# Patient Record
Sex: Male | Born: 1990 | Race: Black or African American | Hispanic: No | Marital: Single | Smoking: Current some day smoker
Health system: Southern US, Community
[De-identification: ages and names within clinical notes are randomized; demographics above are authoritative.]

## PROBLEM LIST (undated history)

## (undated) ENCOUNTER — Ambulatory Visit: Admission: EM

## (undated) ENCOUNTER — Emergency Department: Discharge status: Absent without leave | Payer: BC Managed Care – PPO

## (undated) DIAGNOSIS — N200 Calculus of kidney: Secondary | ICD-10-CM

## (undated) DIAGNOSIS — J45909 Unspecified asthma, uncomplicated: Secondary | ICD-10-CM

## (undated) DIAGNOSIS — E7800 Pure hypercholesterolemia, unspecified: Secondary | ICD-10-CM

## (undated) DIAGNOSIS — M1990 Unspecified osteoarthritis, unspecified site: Secondary | ICD-10-CM

## (undated) DIAGNOSIS — F419 Anxiety disorder, unspecified: Secondary | ICD-10-CM

## (undated) DIAGNOSIS — L0291 Cutaneous abscess, unspecified: Secondary | ICD-10-CM

## (undated) HISTORY — DX: Anxiety disorder, unspecified: F41.9

## (undated) HISTORY — DX: Pure hypercholesterolemia, unspecified: E78.00

## (undated) HISTORY — DX: Calculus of kidney: N20.0

## (undated) HISTORY — PX: KNEE ARTHROSCOPY: SUR90

---

## 2014-10-24 ENCOUNTER — Emergency Department (INDEPENDENT_AMBULATORY_CARE_PROVIDER_SITE_OTHER)
Admission: EM | Admit: 2014-10-24 | Discharge: 2014-10-24 | Disposition: A | Discharge status: Home / Self Care | Payer: Managed Care, Other (non HMO) | Source: Home / Self Care | Attending: Emergency Medicine | Admitting: Emergency Medicine

## 2014-10-24 ENCOUNTER — Encounter (HOSPITAL_COMMUNITY): Payer: Self-pay | Admitting: Emergency Medicine

## 2014-10-24 ENCOUNTER — Ambulatory Visit (HOSPITAL_COMMUNITY): Payer: Managed Care, Other (non HMO) | Attending: Emergency Medicine

## 2014-10-24 DIAGNOSIS — R0789 Other chest pain: Secondary | ICD-10-CM

## 2014-10-24 DIAGNOSIS — R0602 Shortness of breath: Secondary | ICD-10-CM | POA: Insufficient documentation

## 2014-10-24 DIAGNOSIS — R05 Cough: Secondary | ICD-10-CM | POA: Insufficient documentation

## 2014-10-24 DIAGNOSIS — J209 Acute bronchitis, unspecified: Secondary | ICD-10-CM

## 2014-10-24 DIAGNOSIS — R079 Chest pain, unspecified: Secondary | ICD-10-CM | POA: Diagnosis not present

## 2014-10-24 DIAGNOSIS — R059 Cough, unspecified: Secondary | ICD-10-CM

## 2014-10-24 DIAGNOSIS — J4520 Mild intermittent asthma, uncomplicated: Secondary | ICD-10-CM

## 2014-10-24 HISTORY — DX: Unspecified asthma, uncomplicated: J45.909

## 2014-10-24 MED ORDER — HYDROCODONE-ACETAMINOPHEN 5-325 MG PO TABS
ORAL_TABLET | ORAL | Status: AC
  Filled 2014-10-24: qty 2

## 2014-10-24 MED ORDER — BECLOMETHASONE DIPROPIONATE 80 MCG/ACT IN AERS: 2.0000 | INHALATION_SPRAY | Freq: Two times a day (BID) | RESPIRATORY_TRACT | Status: DC

## 2014-10-24 MED ORDER — HYDROCOD POLST-CHLORPHEN POLST 10-8 MG/5ML PO LQCR: 5.0000 mL | Freq: Two times a day (BID) | ORAL | Status: DC | PRN

## 2014-10-24 MED ORDER — PREDNISONE 20 MG PO TABS: 20.0000 mg | ORAL_TABLET | Freq: Two times a day (BID) | ORAL | Status: DC

## 2014-10-24 MED ORDER — ALBUTEROL SULFATE HFA 108 (90 BASE) MCG/ACT IN AERS: 2.0000 | INHALATION_SPRAY | RESPIRATORY_TRACT | Status: DC | PRN

## 2014-10-24 MED ORDER — OXYCODONE-ACETAMINOPHEN 5-325 MG PO TABS: ORAL_TABLET | ORAL | Status: DC

## 2014-10-24 MED ORDER — HYDROCODONE-ACETAMINOPHEN 5-325 MG PO TABS
2.0000 | ORAL_TABLET | Freq: Once | ORAL | Status: AC
  Administered 2014-10-24: 2 via ORAL

## 2014-10-24 MED ORDER — AZITHROMYCIN 250 MG PO TABS: ORAL_TABLET | ORAL | Status: DC

## 2014-10-24 NOTE — Discharge Instructions (Signed)

## 2014-10-24 NOTE — ED Provider Notes (Signed)
Chief Complaint   Cough and Pleurisy   History of Present Illness   Victor Burns is a 23 year old male who's had a one-week history of cough productive yellow sputum, chest pain which is substernal, pleuritic, worse with movement, shortness of breath, he's felt lightheaded, he has a history of asthma all his life and takes albuterol and as-needed basis. He is smoking a pack of cigarettes a day. He's also has some rhinorrhea and sore throat. He denies any fever.  Review of Systems   Other than as noted above, the patient denies any of the following symptoms: Systemic:  No fevers, chills, sweats, or myalgias. Eye:  No redness or discharge. ENT:  No ear pain, headache, nasal congestion, drainage, sinus pressure, or sore throat. Neck:  No neck pain, stiffness, or swollen glands. Lungs:  No cough, sputum production, hemoptysis, wheezing, chest tightness, shortness of breath or chest pain. GI:  No abdominal pain, nausea, vomiting or diarrhea.  PMFSH   Past medical history, family history, social history, meds, and allergies were reviewed.   Physical exam   Vital signs:  BP 120/85 mmHg  Pulse 55  Temp(Src) 98.2 F (36.8 C) (Oral)  Resp 12  SpO2 98% General:  Alert and oriented.  In no distress.  Skin warm and dry. Eye:  No conjunctival injection or drainage. Lids were normal. ENT:  TMs and canals were normal, without erythema or inflammation.  Nasal mucosa was clear and uncongested, without drainage.  Mucous membranes were moist.  Pharynx was clear with no exudate or drainage.  There were no oral ulcerations or lesions. Neck:  Supple, no adenopathy, tenderness or mass. Lungs:  No respiratory distress.  Lungs were clear to auscultation, without wheezes, rales or rhonchi.  Breath sounds were clear and equal bilaterally.  Heart:  Regular rhythm, without gallops, murmers or rubs. Skin:  Clear, warm, and dry, without rash or lesions.  Radiology   Dg Chest 2 View  10/24/2014    CLINICAL DATA:  Productive cough for 2 weeks. Chest pain short of breath  EXAM: CHEST  2 VIEW  COMPARISON:  None.  FINDINGS: Normal mediastinum and cardiac silhouette. Normal pulmonary vasculature. No evidence of effusion, infiltrate, or pneumothorax. No acute bony abnormality.  IMPRESSION: Normal chest radiograph.   Electronically Signed   By: Genevive BiStewart  Edmunds M.D.   On: 10/24/2014 13:41   Course in [facility]   The following medications were given:  Medications  HYDROcodone-acetaminophen (NORCO/VICODIN) 5-325 MG per tablet 2 tablet (2 tablets Oral Given 10/24/14 1207)   Assessment     The primary encounter diagnosis was Acute bronchitis, unspecified organism. Diagnoses of Cough, Musculoskeletal chest pain, and Asthma, mild intermittent, uncomplicated were also pertinent to this visit.  There is no evidence of pneumonia, strep throat, sinusitis, otitis media.    Plan    1.  Meds:  The following meds were prescribed:   Discharge Medication List as of 10/24/2014  2:00 PM    START taking these medications   Details  albuterol (PROVENTIL HFA;VENTOLIN HFA) 108 (90 BASE) MCG/ACT inhaler Inhale 2 puffs into the lungs every 4 (four) hours as needed for wheezing or shortness of breath., Starting 10/24/2014, Until Discontinued, Normal    azithromycin (ZITHROMAX Z-PAK) 250 MG tablet Take as directed., Normal    beclomethasone (QVAR) 80 MCG/ACT inhaler Inhale 2 puffs into the lungs 2 (two) times daily., Starting 10/24/2014, Until Discontinued, Normal    chlorpheniramine-HYDROcodone (TUSSIONEX) 10-8 MG/5ML LQCR Take 5 mLs by mouth every 12 (  twelve) hours as needed for cough., Starting 10/24/2014, Until Discontinued, Normal    oxyCODONE-acetaminophen (PERCOCET) 5-325 MG per tablet 1 to 2 tablets every 6 hours as needed for pain., Print    predniSONE (DELTASONE) 20 MG tablet Take 1 tablet (20 mg total) by mouth 2 (two) times daily., Starting 10/24/2014, Until Discontinued, Normal         2.  Patient Education/Counseling:  The patient was given appropriate handouts, self care instructions, and instructed in symptomatic relief.  Instructed to get extra fluids and extra rest.    3.  Follow up:  The patient was told to follow up here if no better in 3 to 4 days, or sooner if becoming worse in any way, and given some red flag symptoms such as increasing fever, difficulty breathing, chest pain, or persistent vomiting which would prompt immediate return.       Reuben Likesavid C Elias Dennington, MD 10/24/14 2117

## 2014-10-24 NOTE — ED Notes (Signed)
Pt states that he has had a cough since Sunday. Pt states that he started having chest pain after he had been coughing for 45 minutes. Pt is in no acute distress at this time.

## 2015-01-03 ENCOUNTER — Emergency Department (HOSPITAL_COMMUNITY)
Admission: EM | Admit: 2015-01-03 | Discharge: 2015-01-04 | Disposition: A | Discharge status: Home / Self Care | Payer: Managed Care, Other (non HMO) | Attending: Emergency Medicine | Admitting: Emergency Medicine

## 2015-01-03 ENCOUNTER — Encounter (HOSPITAL_COMMUNITY): Payer: Self-pay

## 2015-01-03 ENCOUNTER — Emergency Department (HOSPITAL_COMMUNITY): Payer: Managed Care, Other (non HMO)

## 2015-01-03 DIAGNOSIS — Z7951 Long term (current) use of inhaled steroids: Secondary | ICD-10-CM | POA: Diagnosis not present

## 2015-01-03 DIAGNOSIS — Z720 Tobacco use: Secondary | ICD-10-CM | POA: Insufficient documentation

## 2015-01-03 DIAGNOSIS — Z79899 Other long term (current) drug therapy: Secondary | ICD-10-CM | POA: Insufficient documentation

## 2015-01-03 DIAGNOSIS — J45909 Unspecified asthma, uncomplicated: Secondary | ICD-10-CM | POA: Diagnosis not present

## 2015-01-03 DIAGNOSIS — Z7952 Long term (current) use of systemic steroids: Secondary | ICD-10-CM | POA: Insufficient documentation

## 2015-01-03 DIAGNOSIS — K611 Rectal abscess: Secondary | ICD-10-CM | POA: Diagnosis not present

## 2015-01-03 LAB — COMPREHENSIVE METABOLIC PANEL
ALBUMIN: 3.7 g/dL (ref 3.5–5.2)
ALT: 10 U/L (ref 0–53)
AST: 22 U/L (ref 0–37)
Alkaline Phosphatase: 62 U/L (ref 39–117)
Anion gap: 4 — ABNORMAL LOW (ref 5–15)
BUN: 11 mg/dL (ref 6–23)
CO2: 28 mmol/L (ref 19–32)
Calcium: 8.8 mg/dL (ref 8.4–10.5)
Chloride: 103 mmol/L (ref 96–112)
Creatinine, Ser: 1.14 mg/dL (ref 0.50–1.35)
GFR calc Af Amer: >90 mL/min (ref >90)
GLUCOSE: 93 mg/dL (ref 70–99)
Potassium: 3.5 mmol/L (ref 3.5–5.1)
SODIUM: 135 mmol/L (ref 135–145)
TOTAL PROTEIN: 6.6 g/dL (ref 6.0–8.3)
Total Bilirubin: 0.5 mg/dL (ref 0.3–1.2)

## 2015-01-03 LAB — CBC WITH DIFFERENTIAL/PLATELET
BASOS PCT: 0 % (ref 0–1)
Basophils Absolute: 0 10*3/uL (ref 0.0–0.1)
EOS PCT: 0 % (ref 0–5)
Eosinophils Absolute: 0 10*3/uL (ref 0.0–0.7)
HEMATOCRIT: 36.2 % — AB (ref 39.0–52.0)
HEMOGLOBIN: 12.3 g/dL — AB (ref 13.0–17.0)
Lymphocytes Relative: 14 % (ref 12–46)
Lymphs Abs: 1.8 10*3/uL (ref 0.7–4.0)
MCH: 30.1 pg (ref 26.0–34.0)
MCHC: 34 g/dL (ref 30.0–36.0)
MCV: 88.5 fL (ref 78.0–100.0)
MONOS PCT: 9 % (ref 3–12)
Monocytes Absolute: 1.1 10*3/uL — ABNORMAL HIGH (ref 0.1–1.0)
Neutro Abs: 9.7 10*3/uL — ABNORMAL HIGH (ref 1.7–7.7)
Neutrophils Relative %: 77 % (ref 43–77)
Platelets: 220 10*3/uL (ref 150–400)
RBC: 4.09 MIL/uL — AB (ref 4.22–5.81)
RDW: 12.9 % (ref 11.5–15.5)
WBC: 12.6 10*3/uL — ABNORMAL HIGH (ref 4.0–10.5)

## 2015-01-03 MED ORDER — CEPHALEXIN 500 MG PO CAPS: 500.0000 mg | ORAL_CAPSULE | Freq: Four times a day (QID) | ORAL | Status: DC

## 2015-01-03 MED ORDER — LIDOCAINE-EPINEPHRINE 2 %-1:100000 IJ SOLN
20.0000 mL | Freq: Once | INTRAMUSCULAR | Status: DC
  Filled 2015-01-03: qty 20

## 2015-01-03 MED ORDER — OXYCODONE-ACETAMINOPHEN 5-325 MG PO TABS
1.0000 | ORAL_TABLET | Freq: Once | ORAL | Status: AC
  Administered 2015-01-03: 1 via ORAL
  Filled 2015-01-03: qty 1

## 2015-01-03 MED ORDER — LIDOCAINE-EPINEPHRINE (PF) 2 %-1:200000 IJ SOLN
20.0000 mL | Freq: Once | INTRAMUSCULAR | Status: AC
  Administered 2015-01-03: 20 mL

## 2015-01-03 MED ORDER — HYDROMORPHONE HCL 1 MG/ML IJ SOLN
1.0000 mg | Freq: Once | INTRAMUSCULAR | Status: AC
  Administered 2015-01-03: 1 mg via INTRAVENOUS
  Filled 2015-01-03: qty 1

## 2015-01-03 MED ORDER — IOHEXOL 300 MG/ML  SOLN
100.0000 mL | Freq: Once | INTRAMUSCULAR | Status: AC | PRN
  Administered 2015-01-03: 100 mL via INTRAVENOUS

## 2015-01-03 MED ORDER — HYDROCODONE-ACETAMINOPHEN 5-325 MG PO TABS: 1.0000 | ORAL_TABLET | Freq: Four times a day (QID) | ORAL | Status: DC | PRN

## 2015-01-03 MED ORDER — SODIUM CHLORIDE 0.9 % IV BOLUS (SEPSIS)
1000.0000 mL | Freq: Once | INTRAVENOUS | Status: AC
Start: 2015-01-03 — End: 2015-01-03
  Administered 2015-01-03: 1000 mL via INTRAVENOUS

## 2015-01-03 MED ORDER — LIDOCAINE HCL 2 % EX GEL
1.0000 "application " | Freq: Once | CUTANEOUS | Status: AC
  Administered 2015-01-03: 1 via TOPICAL
  Filled 2015-01-03: qty 20

## 2015-01-03 MED ORDER — SULFAMETHOXAZOLE-TRIMETHOPRIM 800-160 MG PO TABS: 1.0000 | ORAL_TABLET | Freq: Two times a day (BID) | ORAL | Status: AC

## 2015-01-03 NOTE — ED Notes (Signed)
PA and Physician at bedside.

## 2015-01-03 NOTE — ED Notes (Signed)
Pt placed in a gown with BP cuff and pulse ox

## 2015-01-03 NOTE — ED Provider Notes (Signed)
CSN: [id number]     Arrival date & time 01/03/15  1650 History   First MD Initiated Contact with Patient 01/03/15 1920     Chief Complaint  Patient presents with  . Abscess     (Consider location/radiation/quality/duration/timing/severity/associated sxs/prior Treatment) Patient is a 24 y.o. male presenting with abscess. The history is provided by the patient. No language interpreter was used.  Abscess Location:  Ano-genital Ano-genital abscess location:  Anus Abscess quality: fluctuance and painful   Duration:  3 days Progression:  Worsening Pain details:    Quality:  Pressure and throbbing   Severity:  Severe Associated symptoms: nausea   Associated symptoms: no fever     Past Medical History  Diagnosis Date  . Asthma    History reviewed. No pertinent past surgical history. No family history on file. History  Substance Use Topics  . Smoking status: Current Every Day Smoker -- 1.00 packs/day    Types: Cigarettes  . Smokeless tobacco: Not on file  . Alcohol Use: Yes     Comment: at least a beer a day    Review of Systems  Constitutional: Negative for fever.  Gastrointestinal: Positive for nausea.  All other systems reviewed and are negative.     Allergies  Review of patient's allergies indicates no known allergies.  Home Medications   Prior to Admission medications   Medication Sig Start Date End Date Taking? Authorizing Provider  albuterol (PROVENTIL HFA;VENTOLIN HFA) 108 (90 BASE) MCG/ACT inhaler Inhale 2 puffs into the lungs every 4 (four) hours as needed for wheezing or shortness of breath. 10/24/14   Reuben Likes, MD  azithromycin (ZITHROMAX Z-PAK) 250 MG tablet Take as directed. 10/24/14   Reuben Likes, MD  beclomethasone (QVAR) 80 MCG/ACT inhaler Inhale 2 puffs into the lungs 2 (two) times daily. 10/24/14   Reuben Likes, MD  chlorpheniramine-HYDROcodone (TUSSIONEX) 10-8 MG/5ML LQCR Take 5 mLs by mouth every 12 (twelve) hours as needed for cough.  10/24/14   Reuben Likes, MD  oxyCODONE-acetaminophen (PERCOCET) 5-325 MG per tablet 1 to 2 tablets every 6 hours as needed for pain. 10/24/14   Reuben Likes, MD  predniSONE (DELTASONE) 20 MG tablet Take 1 tablet (20 mg total) by mouth 2 (two) times daily. 10/24/14   Reuben Likes, MD   BP 127/71 mmHg  Pulse 77  Temp(Src) 99 F (37.2 C) (Oral)  Resp 20  SpO2 100% Physical Exam  Constitutional: He is oriented to person, place, and time. He appears well-developed and well-nourished.  HENT:  Head: Normocephalic.  Eyes: Pupils are equal, round, and reactive to light.  Neck: Normal range of motion.  Cardiovascular: Normal rate and regular rhythm.   Pulmonary/Chest: Effort normal and breath sounds normal.  Abdominal: Soft. Bowel sounds are normal.  Genitourinary: Rectal exam shows tenderness.     Musculoskeletal: He exhibits no edema or tenderness.  Lymphadenopathy:    He has no cervical adenopathy.  Neurological: He is alert and oriented to person, place, and time.  Skin: Skin is warm and dry.  Psychiatric: He has a normal mood and affect.  Nursing note and vitals reviewed.   ED Course  Procedures (including critical care time)  INCISION AND DRAINAGE Performed by: Jimmye Norman Consent: Verbal consent obtained. Risks and benefits: risks, benefits and alternatives were discussed Type: abscess  Body area: perirectal  Anesthesia: local infiltration  Incision was made with a scalpel.  Local anesthetic: lidocaine 2% with epinephrine  Anesthetic total: 6 ml  Complexity: complex Blunt dissection to break up loculations  Drainage: purulent  Drainage amount: copious Packing material: 1/4 in iodoform gauze  Patient tolerance: Patient tolerated the procedure well with no immediate complications.   Labs Review Labs Reviewed - No data to display  Imaging Review No results found.   EKG Interpretation None     Patient discussed with and seen by Dr. Silverio LayYao.  CT  pelvis reveals large perirectal abscess abutting the anus. MDM   Final diagnoses:  None    Perirectal abscess. I&D performed, packing placed.  Antibiotic and analgesia prescriptions provided.  Patient to return in 2 days for packing removal and wound recheck.    Jimmye Normanavid John Genisis Sonnier, NP 01/04/15 0031  Richardean Canalavid H Yao, MD 01/06/15 [phone]

## 2015-01-03 NOTE — Discharge Instructions (Signed)
Peri-Rectal Abscess Your caregiver has diagnosed you as having a peri-rectal abscess. This is an infected area near the rectum that is filled with pus. If the abscess is near the surface of the skin, your caregiver may open (incise) the area and drain the pus. HOME CARE INSTRUCTIONS   If your abscess was opened up and drained. A small piece of gauze may be placed in the opening so that it can drain. Do not remove the gauze unless directed by your caregiver.  A loose dressing may be placed over the abscess site. Change the dressing as often as necessary to keep it clean and dry.  After the drain is removed, the area may be washed with a gentle antiseptic (soap) four times per day.  A warm sitz bath, warm packs or heating pad may be used for pain relief, taking care not to burn yourself.  Return for a wound check in 2 days.  An "inflatable doughnut" may be used for sitting with added comfort. These can be purchased at a drugstore or medical supply house.  To reduce pain and straining with bowel movements, eat a high fiber diet with plenty of fruits and vegetables. Use stool softeners as recommended by your caregiver. This is especially important if narcotic type pain medications were prescribed as these may cause marked constipation.  Only take over-the-counter or prescription medicines for pain, discomfort, or fever as directed by your caregiver. SEEK IMMEDIATE MEDICAL CARE IF:   You have increasing pain that is not controlled by medication.  There is increased inflammation (redness), swelling, bleeding, or drainage from the area.  An oral temperature above 102 F (38.9 C) develops.  You develop chills or generalized malaise (feel lethargic or feel "washed out").  You develop any new symptoms (problems) you feel may be related to your present problem. Document Released: 11/19/2000 Document Revised: 02/14/2012 Document Reviewed: 11/19/2008 [facility] Patient Information 2015 [place],  [place]. This information is not intended to replace advice given to you by your health care provider. Make sure you discuss any questions you have with your health care provider.

## 2015-01-03 NOTE — ED Notes (Signed)
Pt reports having an abscess on his butt between his legs. Pt reports having to leave work d/t pain.

## 2015-01-03 NOTE — ED Notes (Signed)
MD at bedside. 

## 2015-01-03 NOTE — ED Notes (Signed)
Pt reports swollen area that he states started as a hemrrhoid for "a couple of days." pt reports that he is having trouble walking due to the pain. Pt alert and oriented, denies feeling feverish or having chills.

## 2015-01-05 ENCOUNTER — Encounter (HOSPITAL_COMMUNITY): Payer: Self-pay | Admitting: Emergency Medicine

## 2015-01-05 ENCOUNTER — Emergency Department (HOSPITAL_COMMUNITY)
Admission: EM | Admit: 2015-01-05 | Discharge: 2015-01-05 | Disposition: A | Discharge status: Home / Self Care | Payer: Managed Care, Other (non HMO) | Attending: Emergency Medicine | Admitting: Emergency Medicine

## 2015-01-05 DIAGNOSIS — Z5189 Encounter for other specified aftercare: Secondary | ICD-10-CM

## 2015-01-05 DIAGNOSIS — Z7952 Long term (current) use of systemic steroids: Secondary | ICD-10-CM | POA: Diagnosis not present

## 2015-01-05 DIAGNOSIS — Z79899 Other long term (current) drug therapy: Secondary | ICD-10-CM | POA: Insufficient documentation

## 2015-01-05 DIAGNOSIS — Z4801 Encounter for change or removal of surgical wound dressing: Secondary | ICD-10-CM | POA: Diagnosis present

## 2015-01-05 DIAGNOSIS — Z7951 Long term (current) use of inhaled steroids: Secondary | ICD-10-CM | POA: Diagnosis not present

## 2015-01-05 DIAGNOSIS — K610 Anal abscess: Secondary | ICD-10-CM | POA: Diagnosis not present

## 2015-01-05 DIAGNOSIS — Z720 Tobacco use: Secondary | ICD-10-CM | POA: Diagnosis not present

## 2015-01-05 DIAGNOSIS — J45909 Unspecified asthma, uncomplicated: Secondary | ICD-10-CM | POA: Insufficient documentation

## 2015-01-05 MED ORDER — HYDROMORPHONE HCL 1 MG/ML IJ SOLN
1.0000 mg | Freq: Once | INTRAMUSCULAR | Status: AC
  Administered 2015-01-05: 1 mg via INTRAMUSCULAR
  Filled 2015-01-05: qty 1

## 2015-01-05 NOTE — ED Provider Notes (Signed)
CSN: [id number]     Arrival date & time 01/05/15  1813 History  This chart was scribed for non-physician practitioner, Jaynie Crumbleatyana Kent Riendeau, PA-C working with Nelia Shiobert L Beaton, MD by Greggory StallionKayla Andersen, ED scribe. This patient was seen in room TR06C/TR06C and the patient's care was started at 6:38 PM.    Chief Complaint  Patient presents with  . Wound Check   The history is provided by the patient. No language interpreter was used.    HPI Comments: Victor Burns is a 24 y.o. male who presents to the Emergency Department for wound check. Pt had abscess to his buttocks I&D'd and packed 2 days ago and is here for packing removal. Reports continued pain and drainage from the area. He has taken Vicodin with no relief. Just started antibiotics today. No fever, chills, malaise.   Past Medical History  Diagnosis Date  . Asthma    History reviewed. No pertinent past surgical history. No family history on file. History  Substance Use Topics  . Smoking status: Current Every Day Smoker -- 1.00 packs/day    Types: Cigarettes  . Smokeless tobacco: Not on file  . Alcohol Use: Yes     Comment: at least a beer a day    Review of Systems  Constitutional: Negative for fever and chills.  Skin:       Abscess  All other systems reviewed and are negative.  Allergies  Review of patient's allergies indicates no known allergies.  Home Medications   Prior to Admission medications   Medication Sig Start Date End Date Taking? Authorizing Provider  albuterol (PROVENTIL HFA;VENTOLIN HFA) 108 (90 BASE) MCG/ACT inhaler Inhale 2 puffs into the lungs every 4 (four) hours as needed for wheezing or shortness of breath. 10/24/14   Reuben Likesavid C Keller, MD  azithromycin (ZITHROMAX Z-PAK) 250 MG tablet Take as directed. 10/24/14   Reuben Likesavid C Keller, MD  beclomethasone (QVAR) 80 MCG/ACT inhaler Inhale 2 puffs into the lungs 2 (two) times daily. 10/24/14   Reuben Likesavid C Keller, MD  cephALEXin (KEFLEX) 500 MG capsule Take 1 capsule (500  mg total) by mouth 4 (four) times daily. 01/03/15   Jimmye Normanavid John Smith, NP  chlorpheniramine-HYDROcodone (TUSSIONEX) 10-8 MG/5ML LQCR Take 5 mLs by mouth every 12 (twelve) hours as needed for cough. 10/24/14   Reuben Likesavid C Keller, MD  HYDROcodone-acetaminophen (NORCO/VICODIN) 5-325 MG per tablet Take 1 tablet by mouth every 6 (six) hours as needed for severe pain. 01/03/15   Jimmye Normanavid John Smith, NP  ibuprofen (ADVIL,MOTRIN) 800 MG tablet Take 800 mg by mouth every 8 (eight) hours as needed.    Historical Provider, MD  oxyCODONE-acetaminophen (PERCOCET) 5-325 MG per tablet 1 to 2 tablets every 6 hours as needed for pain. 10/24/14   Reuben Likesavid C Keller, MD  phenylephrine-shark liver oil-mineral oil-petrolatum (PREPARATION H) 0.25-3-14-71.9 % rectal ointment Place 1 application rectally 2 (two) times daily as needed for hemorrhoids.    Historical Provider, MD  predniSONE (DELTASONE) 20 MG tablet Take 1 tablet (20 mg total) by mouth 2 (two) times daily. 10/24/14   Reuben Likesavid C Keller, MD  sulfamethoxazole-trimethoprim (BACTRIM DS,SEPTRA DS) 800-160 MG per tablet Take 1 tablet by mouth 2 (two) times daily. 01/03/15 01/10/15  Jimmye Normanavid John Smith, NP   BP 134/74 mmHg  Pulse 72  Temp(Src) 97.6 F (36.4 C) (Oral)  Resp 16  SpO2 97%   Physical Exam  Constitutional: He is oriented to person, place, and time. He appears well-developed and well-nourished. No distress.  HENT:  Head:  Normocephalic and atraumatic.  Eyes: Conjunctivae and EOM are normal.  Neck: Neck supple. No tracheal deviation present.  Cardiovascular: Normal rate.   Pulmonary/Chest: Effort normal. No respiratory distress.  Musculoskeletal: Normal range of motion.  Neurological: He is alert and oriented to person, place, and time.  Skin: Skin is warm and dry.  Incised abscess to the left perianal area, with packing intact, no surrounding erythema, there is swelling, induration, tender to palpation. Approximately 5 x 5 cm.  Psychiatric: He has a normal mood and  affect. His behavior is normal.  Nursing note and vitals reviewed.   ED Course  Procedures (including critical care time)  DIAGNOSTIC STUDIES: Oxygen Saturation is 97% on RA, normal by my interpretation.    COORDINATION OF CARE: 6:41 PM-Pt requests pain medication before packing removal. Will give IM dilaudid then remove packing.    Labs Review Labs Reviewed - No data to display  Imaging Review Ct Pelvis W Contrast  01/03/2015   CLINICAL DATA:  Pt reports swollen area that he states started as a hemrrhoid for "a couple of days." pt reports that he is having trouble walking due to the pain.  EXAM: CT PELVIS WITH CONTRAST  TECHNIQUE: Multidetector CT imaging of the pelvis was performed using the standard protocol following the bolus administration of intravenous contrast.  CONTRAST:  OMNIPAQUE IOHEXOL 300 MG/ML  SOLN  COMPARISON:  None.  FINDINGS: Visualized small bowel and colon are normal without wall thickening or dilatation. There is no pelvic free fluid. There is no pelvic lymphadenopathy. The bladder is normal.  There is a left posterior perineal 4.3 x 2.9 x 3.7 cm fluid collection with an air-fluid level and surrounding inflammatory changes most consistent with an abscess with the superior margin abutting the anus.  No acute osseous abnormality. No fracture or dislocation. No lytic or sclerotic osseous lesion.  IMPRESSION: There is a left posterior perineal 4.3 x 2.9 x 3.7 cm fluid collection with an air-fluid level and surrounding inflammatory changes most consistent with an abscess.   Electronically Signed   By: Elige Ko   On: 01/03/2015 21:47     EKG Interpretation None      MDM   Final diagnoses:  Abscess, perianal  Encounter for wound re-check     patient is here for packing removal and wound recheck. Patient with perianal abscess by CT scan 2 days ago, abscess incised and drained in emergency department, packed. Patient is here for recheck. He continues to have  pain and drainage. He denies any fever or chills. He is taking Vicodin with minimal relief of his pain. Packing removed, abscess appears to be healing well. No purulent drainage expressed on evaluation. No apparent surrounding cellulitis. Patient just started antibiotics today, encouraged to make sure he takes all of them. Wound care at home, including warm soaks. Follow up with primary care doctor, return as needed.  Filed Vitals:   01/05/15 1818  BP: 134/74  Pulse: 72  Temp: 97.6 F (36.4 C)  TempSrc: Oral  Resp: 16  SpO2: 97%     I personally performed the services described in this documentation, which was scribed in my presence. The recorded information has been reviewed and is accurate.  Lottie Mussel, PA-C 01/05/15 1922  Lottie Mussel, PA-C 01/05/15 1922  Nelia Shi, MD 01/05/15 [phone]

## 2015-01-05 NOTE — ED Notes (Signed)
Seen in ED on Friday for abscess on buttocks and told to come back for packing removal.

## 2015-01-05 NOTE — Discharge Instructions (Signed)
Continue pain medications and antibiotics. Warm soaks with antibacterial soap several times a day. Follow up as needed   Abscess Care After An abscess (also called a boil or furuncle) is an infected area that contains a collection of pus. Signs and symptoms of an abscess include pain, tenderness, redness, or hardness, or you may feel a moveable soft area under your skin. An abscess can occur anywhere in the body. The infection may spread to surrounding tissues causing cellulitis. A cut (incision) by the surgeon was made over your abscess and the pus was drained out. Gauze may have been packed into the space to provide a drain that will allow the cavity to heal from the inside outwards. The boil may be painful for 5 to 7 days. Most people with a boil do not have high fevers. Your abscess, if seen early, may not have localized, and may not have been lanced. If not, another appointment may be required for this if it does not get better on its own or with medications. HOME CARE INSTRUCTIONS   Only take over-the-counter or prescription medicines for pain, discomfort, or fever as directed by your caregiver.  When you bathe, soak and then remove gauze or iodoform packs at least daily or as directed by your caregiver. You may then wash the wound gently with mild soapy water. Repack with gauze or do as your caregiver directs. SEEK IMMEDIATE MEDICAL CARE IF:   You develop increased pain, swelling, redness, drainage, or bleeding in the wound site.  You develop signs of generalized infection including muscle aches, chills, fever, or a general ill feeling.  An oral temperature above 102 F (38.9 C) develops, not controlled by medication. See your caregiver for a recheck if you develop any of the symptoms described above. If medications (antibiotics) were prescribed, take them as directed. Document Released: 06/10/2005 Document Revised: 02/14/2012 Document Reviewed: 02/05/2008 [facility] Patient Information  2015 [place]Care, [place]. This information is not intended to replace advice given to you by your health care provider. Make sure you discuss any questions you have with your health care provider.

## 2015-04-16 ENCOUNTER — Encounter (HOSPITAL_COMMUNITY): Payer: Self-pay | Admitting: Emergency Medicine

## 2015-04-16 ENCOUNTER — Emergency Department (HOSPITAL_COMMUNITY): Payer: Managed Care, Other (non HMO)

## 2015-04-16 ENCOUNTER — Emergency Department (HOSPITAL_COMMUNITY)
Admission: EM | Admit: 2015-04-16 | Discharge: 2015-04-16 | Discharge status: Against Medical Advice | Payer: Managed Care, Other (non HMO) | Attending: Emergency Medicine | Admitting: Emergency Medicine

## 2015-04-16 DIAGNOSIS — R05 Cough: Secondary | ICD-10-CM | POA: Diagnosis present

## 2015-04-16 DIAGNOSIS — Z720 Tobacco use: Secondary | ICD-10-CM | POA: Diagnosis not present

## 2015-04-16 DIAGNOSIS — J45909 Unspecified asthma, uncomplicated: Secondary | ICD-10-CM | POA: Diagnosis not present

## 2015-04-16 MED ORDER — ALBUTEROL SULFATE (2.5 MG/3ML) 0.083% IN NEBU
5.0000 mg | INHALATION_SOLUTION | Freq: Once | RESPIRATORY_TRACT | Status: AC
  Administered 2015-04-16: 5 mg via RESPIRATORY_TRACT
  Filled 2015-04-16: qty 6

## 2015-04-16 NOTE — ED Notes (Signed)
Patient called for triage x3 for room with no answer

## 2015-04-16 NOTE — ED Notes (Signed)
Pt. reports asthma attack this evening unrelieved by rescue inhaler , occasional dry cough , no fever or chills.

## 2015-08-08 ENCOUNTER — Encounter (HOSPITAL_COMMUNITY): Payer: Self-pay | Admitting: *Deleted

## 2015-08-08 ENCOUNTER — Emergency Department (HOSPITAL_COMMUNITY): Payer: Managed Care, Other (non HMO)

## 2015-08-08 ENCOUNTER — Emergency Department (HOSPITAL_COMMUNITY)
Admission: EM | Admit: 2015-08-08 | Discharge: 2015-08-08 | Disposition: A | Discharge status: Home / Self Care | Payer: Managed Care, Other (non HMO) | Attending: Emergency Medicine | Admitting: Emergency Medicine

## 2015-08-08 DIAGNOSIS — Y998 Other external cause status: Secondary | ICD-10-CM | POA: Diagnosis not present

## 2015-08-08 DIAGNOSIS — S299XXA Unspecified injury of thorax, initial encounter: Secondary | ICD-10-CM | POA: Diagnosis not present

## 2015-08-08 DIAGNOSIS — Z720 Tobacco use: Secondary | ICD-10-CM | POA: Insufficient documentation

## 2015-08-08 DIAGNOSIS — M7918 Myalgia, other site: Secondary | ICD-10-CM

## 2015-08-08 DIAGNOSIS — Y9389 Activity, other specified: Secondary | ICD-10-CM | POA: Diagnosis not present

## 2015-08-08 DIAGNOSIS — J45909 Unspecified asthma, uncomplicated: Secondary | ICD-10-CM | POA: Insufficient documentation

## 2015-08-08 DIAGNOSIS — Y92410 Unspecified street and highway as the place of occurrence of the external cause: Secondary | ICD-10-CM | POA: Insufficient documentation

## 2015-08-08 MED ORDER — OXYCODONE-ACETAMINOPHEN 5-325 MG PO TABS
1.0000 | ORAL_TABLET | Freq: Once | ORAL | Status: AC
  Administered 2015-08-08: 1 via ORAL
  Filled 2015-08-08: qty 1

## 2015-08-08 MED ORDER — OXYCODONE-ACETAMINOPHEN 5-325 MG PO TABS: 2.0000 | ORAL_TABLET | ORAL | Status: DC | PRN

## 2015-08-08 MED ORDER — METHOCARBAMOL 500 MG PO TABS
500.0000 mg | ORAL_TABLET | Freq: Two times a day (BID) | ORAL | Status: DC
Start: 2015-08-08 — End: 2016-04-03

## 2015-08-08 MED ORDER — KETOROLAC TROMETHAMINE 30 MG/ML IJ SOLN
30.0000 mg | Freq: Once | INTRAMUSCULAR | Status: AC
  Administered 2015-08-08: 30 mg via INTRAMUSCULAR
  Filled 2015-08-08: qty 1

## 2015-08-08 MED ORDER — CYCLOBENZAPRINE HCL 10 MG PO TABS
5.0000 mg | ORAL_TABLET | Freq: Once | ORAL | Status: AC
  Administered 2015-08-08: 5 mg via ORAL
  Filled 2015-08-08: qty 1

## 2015-08-08 MED ORDER — METHOCARBAMOL 500 MG PO TABS: 500.0000 mg | ORAL_TABLET | Freq: Two times a day (BID) | ORAL | Status: DC

## 2015-08-08 MED ORDER — NAPROXEN 500 MG PO TABS: 500.0000 mg | ORAL_TABLET | Freq: Two times a day (BID) | ORAL | Status: DC
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
## 2015-08-08 NOTE — ED Notes (Addendum)
Pt refused to got to xray due to pain. PA advised. Family remains at bedside

## 2015-08-08 NOTE — ED Notes (Signed)
Pt in radiology 

## 2015-09-19 ENCOUNTER — Encounter (HOSPITAL_COMMUNITY): Payer: Self-pay | Admitting: Emergency Medicine

## 2015-09-19 ENCOUNTER — Emergency Department (HOSPITAL_COMMUNITY)
Admission: EM | Admit: 2015-09-19 | Discharge: 2015-09-19 | Disposition: A | Discharge status: Home / Self Care | Payer: Managed Care, Other (non HMO) | Attending: Emergency Medicine | Admitting: Emergency Medicine

## 2015-09-19 DIAGNOSIS — Z791 Long term (current) use of non-steroidal anti-inflammatories (NSAID): Secondary | ICD-10-CM | POA: Diagnosis not present

## 2015-09-19 DIAGNOSIS — L0231 Cutaneous abscess of buttock: Secondary | ICD-10-CM

## 2015-09-19 DIAGNOSIS — Z79899 Other long term (current) drug therapy: Secondary | ICD-10-CM | POA: Diagnosis not present

## 2015-09-19 DIAGNOSIS — J45909 Unspecified asthma, uncomplicated: Secondary | ICD-10-CM | POA: Insufficient documentation

## 2015-09-19 DIAGNOSIS — Z720 Tobacco use: Secondary | ICD-10-CM | POA: Diagnosis not present

## 2015-09-19 HISTORY — DX: Cutaneous abscess, unspecified: L02.91

## 2015-09-19 MED ORDER — LIDOCAINE HCL (PF) 1 % IJ SOLN
10.0000 mL | Freq: Once | INTRAMUSCULAR | Status: AC
  Administered 2015-09-19: 10 mL via INTRADERMAL
  Filled 2015-09-19: qty 10

## 2015-09-19 MED ORDER — SULFAMETHOXAZOLE-TRIMETHOPRIM 800-160 MG PO TABS
1.0000 | ORAL_TABLET | Freq: Two times a day (BID) | ORAL | Status: AC
Start: 2015-09-19 — End: 2015-09-26

## 2015-09-19 MED ORDER — CEPHALEXIN 500 MG PO CAPS: 500.0000 mg | ORAL_CAPSULE | Freq: Four times a day (QID) | ORAL | Status: DC

## 2015-09-19 MED ORDER — HYDROCODONE-ACETAMINOPHEN 5-325 MG PO TABS: 1.0000 | ORAL_TABLET | ORAL | Status: DC | PRN

## 2015-09-19 MED ORDER — OXYCODONE-ACETAMINOPHEN 5-325 MG PO TABS
2.0000 | ORAL_TABLET | Freq: Once | ORAL | Status: AC
  Administered 2015-09-19: 2 via ORAL
  Filled 2015-09-19: qty 2

## 2015-09-19 NOTE — ED Notes (Signed)
Pt c/o abscess in rectal area x couple days. Pt reports history of same in past and was able to have it cut and drained.

## 2015-09-19 NOTE — ED Notes (Signed)
Pt comfortable with discharge and follow up instructions. Prescriptions x3. 

## 2015-09-19 NOTE — ED Provider Notes (Signed)
CSN: [id number]     Arrival date & time 09/19/15  0957 History   First MD Initiated Contact with Patient 09/19/15 1034     Chief Complaint  Patient presents with  . Abscess     (Consider location/radiation/quality/duration/timing/severity/associated sxs/prior Treatment) Patient is a 24 y.o. male presenting with abscess. The history is provided by the patient and medical records.  Abscess  24 year old male with history of asthma and recurrent buttock abscesses, presenting to the ED for another buttock abscess. Patient states current abscess developed on his left medial buttock approximately 3 days ago. He states area has become increasingly more painful. He denies any drainage. No fever or chills. He states he came in today because pain became unbearable and he was unable to sleep last night. Prior I&D was performed in the emergency department, states it healed well without any complications.  Patient has no history of HIV, diabetes, or MRSA.  No intervention tried PTA.  Past Medical History  Diagnosis Date  . Asthma   . Abscess    History reviewed. No pertinent past surgical history. No family history on file. Social History  Substance Use Topics  . Smoking status: Current Every Day Smoker -- 1.00 packs/day    Types: Cigarettes  . Smokeless tobacco: None  . Alcohol Use: Yes     Comment: at least a beer a day    Review of Systems  Skin:       abscess  All other systems reviewed and are negative.     Allergies  Review of patient's allergies indicates no known allergies.  Home Medications   Prior to Admission medications   Medication Sig Start Date End Date Taking? Authorizing Provider  albuterol (PROVENTIL HFA;VENTOLIN HFA) 108 (90 BASE) MCG/ACT inhaler Inhale 2 puffs into the lungs every 4 (four) hours as needed for wheezing or shortness of breath. 10/24/14   Reuben Likes, MD  azithromycin (ZITHROMAX Z-PAK) 250 MG tablet Take as directed. Patient not taking: Reported  on 08/08/2015 10/24/14   Reuben Likes, MD  beclomethasone (QVAR) 80 MCG/ACT inhaler Inhale 2 puffs into the lungs 2 (two) times daily. Patient not taking: Reported on 08/08/2015 10/24/14   Reuben Likes, MD  cephALEXin (KEFLEX) 500 MG capsule Take 1 capsule (500 mg total) by mouth 4 (four) times daily. Patient not taking: Reported on 08/08/2015 01/03/15   Felicie Morn, NP  chlorpheniramine-HYDROcodone (TUSSIONEX) 10-8 MG/5ML LQCR Take 5 mLs by mouth every 12 (twelve) hours as needed for cough. Patient not taking: Reported on 08/08/2015 10/24/14   Reuben Likes, MD  HYDROcodone-acetaminophen (NORCO/VICODIN) 5-325 MG per tablet Take 1 tablet by mouth every 6 (six) hours as needed for severe pain. Patient not taking: Reported on 08/08/2015 01/03/15   Felicie Morn, NP  methocarbamol (ROBAXIN) 500 MG tablet Take 1 tablet (500 mg total) by mouth 2 (two) times daily. 08/08/15   Hanna Patel-Mills, PA-C  methocarbamol (ROBAXIN) 500 MG tablet Take 1 tablet (500 mg total) by mouth 2 (two) times daily. 08/08/15   Hanna Patel-Mills, PA-C  naproxen (NAPROSYN) 500 MG tablet Take 1 tablet (500 mg total) by mouth 2 (two) times daily. 08/08/15   Hanna Patel-Mills, PA-C  naproxen (NAPROSYN) 500 MG tablet Take 1 tablet (500 mg total) by mouth 2 (two) times daily. 08/08/15   Hanna Patel-Mills, PA-C  oxyCODONE-acetaminophen (PERCOCET/ROXICET) 5-325 MG per tablet Take 2 tablets by mouth every 4 (four) hours as needed for severe pain. 08/08/15   Catha Gosselin, PA-C  oxyCODONE-acetaminophen (  PERCOCET/ROXICET) 5-325 MG per tablet Take 2 tablets by mouth every 4 (four) hours as needed for severe pain. 08/08/15   Hanna Patel-Mills, PA-C  predniSONE (DELTASONE) 20 MG tablet Take 1 tablet (20 mg total) by mouth 2 (two) times daily. Patient not taking: Reported on 08/08/2015 10/24/14   Reuben Likesavid C Keller, MD   BP 141/82 mmHg  Pulse 98  Temp(Src) 99 F (37.2 C) (Oral)  Resp 18  Ht 6' (1.829 m)  Wt 170 lb (77.111 kg)  BMI 23.05 kg/m2  SpO2 100%     Physical Exam  Constitutional: He is oriented to person, place, and time. He appears well-developed and well-nourished.  HENT:  Head: Normocephalic and atraumatic.  Mouth/Throat: Oropharynx is clear and moist.  Eyes: Conjunctivae and EOM are normal. Pupils are equal, round, and reactive to light.  Neck: Normal range of motion.  Cardiovascular: Normal rate, regular rhythm and normal heart sounds.   Pulmonary/Chest: Effort normal and breath sounds normal.  Abdominal: Soft. Bowel sounds are normal.  Genitourinary:     Musculoskeletal: Normal range of motion.  Neurological: He is alert and oriented to person, place, and time.  Skin: Skin is warm and dry.  Psychiatric: He has a normal mood and affect.  Nursing note and vitals reviewed.   ED Course  Procedures (including critical care time)  INCISION AND DRAINAGE Performed by: Garlon HatchetSANDERS, Seletha Zimmermann M Consent: Verbal consent obtained. Risks and benefits: risks, benefits and alternatives were discussed Type: abscess  Body area: left buttock, not peri-rectal  Anesthesia: local infiltration  Incision was made with a scalpel.  Local anesthetic: lidocaine 1% without epinephrine  Anesthetic total: 5 ml  Complexity: complex Blunt dissection to break up loculations  Drainage: purulent  Drainage amount: large  Packing material: none  Patient tolerance: Patient tolerated the procedure well with no immediate complications.    Labs Review Labs Reviewed - No data to display  Imaging Review No results found.    EKG Interpretation None      MDM   Final diagnoses:  Left buttock abscess   24 year old male here with left buttock abscess. He has a history of the same. Abscess is small, centrally fluctuant, and not directly peri-rectal.  There are no signs of surrounding cellulitis. Vital signs stable. ID performed as above, patient tolerated well.  Wound was not packed at patient's request.  Will start on bactrim and keflex as  patient responded well to this previously.  Vicodin for pain.  Encouraged warm soaks at home.  Discussed plan with patient, he/she acknowledged understanding and agreed with plan of care.  Return precautions given for new or worsening symptoms.  Garlon HatchetLisa M Jay Kempe, PA-C 09/19/15 1352  Gerhard Munchobert Lockwood, MD 09/20/15 [phone]

## 2015-09-19 NOTE — Discharge Instructions (Signed)
Take the prescribed medication as directed.  Recommend warm soaks at home to help with drainage. Return to the ED for new or worsening symptoms.

## 2016-04-03 ENCOUNTER — Ambulatory Visit (INDEPENDENT_AMBULATORY_CARE_PROVIDER_SITE_OTHER): Payer: Managed Care, Other (non HMO) | Admitting: Emergency Medicine

## 2016-04-03 VITALS — BP 128/70 | HR 53 | Temp 98.4°F | Resp 18 | Ht 72.0 in | Wt 172.6 lb

## 2016-04-03 DIAGNOSIS — R112 Nausea with vomiting, unspecified: Secondary | ICD-10-CM | POA: Diagnosis not present

## 2016-04-03 DIAGNOSIS — F329 Major depressive disorder, single episode, unspecified: Secondary | ICD-10-CM | POA: Diagnosis not present

## 2016-04-03 DIAGNOSIS — R197 Diarrhea, unspecified: Secondary | ICD-10-CM

## 2016-04-03 DIAGNOSIS — R1030 Lower abdominal pain, unspecified: Secondary | ICD-10-CM | POA: Diagnosis not present

## 2016-04-03 DIAGNOSIS — R55 Syncope and collapse: Secondary | ICD-10-CM

## 2016-04-03 DIAGNOSIS — R42 Dizziness and giddiness: Secondary | ICD-10-CM | POA: Diagnosis not present

## 2016-04-03 DIAGNOSIS — F32A Depression, unspecified: Secondary | ICD-10-CM

## 2016-04-03 DIAGNOSIS — Z7251 High risk heterosexual behavior: Secondary | ICD-10-CM

## 2016-04-03 LAB — POCT CBC
GRANULOCYTE PERCENT: 59.6 % (ref 37–80)
HEMATOCRIT: 41 % — AB (ref 43.5–53.7)
Hemoglobin: 14.5 g/dL (ref 14.1–18.1)
Lymph, poc: 1.7 (ref 0.6–3.4)
MCH, POC: 31.3 pg — AB (ref 27–31.2)
MCHC: 35.4 g/dL (ref 31.8–35.4)
MCV: 88.3 fL (ref 80–97)
MID (CBC): 0.3 (ref 0–0.9)
MPV: 6.7 fL (ref 0–99.8)
PLATELET COUNT, POC: 245 10*3/uL (ref 142–424)
POC Granulocyte: 2.9 (ref 2–6.9)
POC LYMPH %: 34.7 % (ref 10–50)
POC MID %: 5.7 %M (ref 0–12)
RBC: 4.65 M/uL — AB (ref 4.69–6.13)
RDW, POC: 13 %
WBC: 4.8 10*3/uL (ref 4.6–10.2)

## 2016-04-03 LAB — POCT URINALYSIS DIP (MANUAL ENTRY)
Bilirubin, UA: NEGATIVE
Blood, UA: NEGATIVE
Glucose, UA: NEGATIVE
Leukocytes, UA: NEGATIVE
Nitrite, UA: NEGATIVE
PH UA: 7
PROTEIN UA: NEGATIVE
SPEC GRAV UA: 1.015
Urobilinogen, UA: 2

## 2016-04-03 LAB — POC MICROSCOPIC URINALYSIS (UMFC)

## 2016-04-03 LAB — GLUCOSE, POCT (MANUAL RESULT ENTRY): POC Glucose: 97 mg/dl (ref 70–99)

## 2016-04-03 MED ORDER — ESCITALOPRAM OXALATE 10 MG PO TABS: 10.0000 mg | ORAL_TABLET | Freq: Every day | ORAL | Status: DC

## 2016-04-03 MED ORDER — ALBUTEROL SULFATE HFA 108 (90 BASE) MCG/ACT IN AERS: 2.0000 | INHALATION_SPRAY | RESPIRATORY_TRACT | Status: DC | PRN
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
## 2016-04-03 NOTE — Patient Instructions (Addendum)
   IF you received an x-ray today, you will receive an invoice from [facility]. Please contact [facility] at [phone] with questions or concerns regarding your invoice.   IF you received labwork today, you will receive an invoice from [facility]. Please contact [facility] at [phone] with questions or concerns regarding your invoice.   Our billing staff will not be able to assist you with questions regarding bills from these companies.  You will be contacted with the lab results as soon as they are available. The fastest way to get your results is to activate your My Chart account. Instructions are located on the last page of this paperwork. If you have not heard from us regarding the results in 2 weeks, please contact this office.     Major Depressive Disorder Major depressive disorder is a mental illness. It also may be called clinical depression or unipolar depression. Major depressive disorder usually causes feelings of sadness, hopelessness, or helplessness. Some people with this disorder do not feel particularly sad but lose interest in doing things they used to enjoy (anhedonia). Major depressive disorder also can cause physical symptoms. It can interfere with work, school, relationships, and other normal everyday activities. The disorder varies in severity but is longer lasting and more serious than the sadness we all feel from time to time in our lives. Major depressive disorder often is triggered by stressful life events or major life changes. Examples of these triggers include divorce, loss of your job or home, a move, and the death of a family member or close friend. Sometimes this disorder occurs for no obvious reason at all. People who have family members with major depressive disorder or bipolar disorder are at higher risk for developing this disorder, with or without life stressors. Major depressive disorder can occur at any age.  It may occur just once in your life (single episode major depressive disorder). It may occur multiple times (recurrent major depressive disorder). SYMPTOMS People with major depressive disorder have either anhedonia or depressed mood on nearly a daily basis for at least 2 weeks or longer. Symptoms of depressed mood include:  Feelings of sadness (blue or down in the dumps) or emptiness.  Feelings of hopelessness or helplessness.  Tearfulness or episodes of crying (may be observed by others).  Irritability (children and adolescents). In addition to depressed mood or anhedonia or both, people with this disorder have at least four of the following symptoms:  Difficulty sleeping or sleeping too much.   Significant change (increase or decrease) in appetite or weight.   Lack of energy or motivation.  Feelings of guilt and worthlessness.   Difficulty concentrating, remembering, or making decisions.  Unusually slow movement (psychomotor retardation) or restlessness (as observed by others).   Recurrent wishes for death, recurrent thoughts of self-harm (suicide), or a suicide attempt. People with major depressive disorder commonly have persistent negative thoughts about themselves, other people, and the world. People with severe major depressive disorder may experiencedistorted beliefs or perceptions about the world (psychotic delusions). They also may see or hear things that are not real (psychotic hallucinations). DIAGNOSIS Major depressive disorder is diagnosed through an assessment by your health care provider. Your health care provider will ask aboutaspects of your daily life, such as mood,sleep, and appetite, to see if you have the diagnostic symptoms of major depressive disorder. Your health care provider may ask about your medical history and use of alcohol or drugs, including prescription medicines. Your health care provider   also may do a physical exam and blood work. This is because  certain medical conditions and the use of certain substances can cause major depressive disorder-like symptoms (secondary depression). Your health care provider also may refer you to a mental health specialist for further evaluation and treatment. TREATMENT It is important to recognize the symptoms of major depressive disorder and seek treatment. The following treatments can be prescribed for this disorder:   Medicine. Antidepressant medicines usually are prescribed. Antidepressant medicines are thought to correct chemical imbalances in the brain that are commonly associated with major depressive disorder. Other types of medicine may be added if the symptoms do not respond to antidepressant medicines alone or if psychotic delusions or hallucinations occur.  Talk therapy. Talk therapy can be helpful in treating major depressive disorder by providing support, education, and guidance. Certain types of talk therapy also can help with negative thinking (cognitive behavioral therapy) and with relationship issues that trigger this disorder (interpersonal therapy). A mental health specialist can help determine which treatment is best for you. Most people with major depressive disorder do well with a combination of medicine and talk therapy. Treatments involving electrical stimulation of the brain can be used in situations with extremely severe symptoms or when medicine and talk therapy do not work over time. These treatments include electroconvulsive therapy, transcranial magnetic stimulation, and vagal nerve stimulation.   This information is not intended to replace advice given to you by your health care provider. Make sure you discuss any questions you have with your health care provider.   Document Released: 03/19/2013 Document Revised: 12/13/2014 Document Reviewed: 03/19/2013 Elsevier Interactive Patient Education 2016 [employer]  

## 2016-04-04 LAB — HEPATITIS B SURFACE ANTIGEN: Hepatitis B Surface Ag: NEGATIVE

## 2016-04-04 LAB — COMPLETE METABOLIC PANEL WITH GFR
ALT: 11 U/L (ref 9–46)
AST: 22 U/L (ref 10–40)
Albumin: 4.3 g/dL (ref 3.6–5.1)
Alkaline Phosphatase: 47 U/L (ref 40–115)
BILIRUBIN TOTAL: 0.8 mg/dL (ref 0.2–1.2)
BUN: 10 mg/dL (ref 7–25)
CALCIUM: 9.3 mg/dL (ref 8.6–10.3)
CHLORIDE: 104 mmol/L (ref 98–110)
CO2: 27 mmol/L (ref 20–31)
CREATININE: 1.01 mg/dL (ref 0.60–1.35)
GFR, Est Non African American: >89 mL/min (ref >=60)
Glucose, Bld: 92 mg/dL (ref 65–99)
POTASSIUM: 4.3 mmol/L (ref 3.5–5.3)
Sodium: 141 mmol/L (ref 135–146)
Total Protein: 6.5 g/dL (ref 6.1–8.1)

## 2016-04-04 LAB — HIV ANTIBODY (ROUTINE TESTING W REFLEX): HIV: NONREACTIVE

## 2016-04-04 LAB — HEPATITIS C ANTIBODY: HCV Ab: NEGATIVE

## 2016-04-05 LAB — GC/CHLAMYDIA PROBE AMP
CT PROBE, AMP APTIMA: NOT DETECTED
GC Probe RNA: NOT DETECTED

## 2016-04-06 LAB — RPR

## 2016-05-30 ENCOUNTER — Emergency Department (HOSPITAL_COMMUNITY)
Admission: EM | Admit: 2016-05-30 | Discharge: 2016-05-30 | Disposition: A | Discharge status: Home / Self Care | Payer: Managed Care, Other (non HMO) | Attending: Emergency Medicine | Admitting: Emergency Medicine

## 2016-05-30 ENCOUNTER — Encounter (HOSPITAL_COMMUNITY): Payer: Self-pay | Admitting: Emergency Medicine

## 2016-05-30 DIAGNOSIS — F1090 Alcohol use, unspecified, uncomplicated: Secondary | ICD-10-CM

## 2016-05-30 DIAGNOSIS — F17210 Nicotine dependence, cigarettes, uncomplicated: Secondary | ICD-10-CM | POA: Diagnosis not present

## 2016-05-30 DIAGNOSIS — Z7951 Long term (current) use of inhaled steroids: Secondary | ICD-10-CM | POA: Diagnosis not present

## 2016-05-30 DIAGNOSIS — R112 Nausea with vomiting, unspecified: Secondary | ICD-10-CM

## 2016-05-30 DIAGNOSIS — Z789 Other specified health status: Secondary | ICD-10-CM

## 2016-05-30 DIAGNOSIS — F1010 Alcohol abuse, uncomplicated: Secondary | ICD-10-CM | POA: Insufficient documentation

## 2016-05-30 DIAGNOSIS — J45909 Unspecified asthma, uncomplicated: Secondary | ICD-10-CM | POA: Diagnosis not present

## 2016-05-30 DIAGNOSIS — Z7289 Other problems related to lifestyle: Secondary | ICD-10-CM

## 2016-05-30 LAB — CBC WITH DIFFERENTIAL/PLATELET
Basophils Absolute: 0 K/uL (ref 0.0–0.1)
Basophils Relative: 0 %
Eosinophils Absolute: 0 K/uL (ref 0.0–0.7)
Eosinophils Relative: 0 %
HCT: 44.8 % (ref 39.0–52.0)
Hemoglobin: 15.5 g/dL (ref 13.0–17.0)
Lymphocytes Relative: 12 %
Lymphs Abs: 1.1 K/uL (ref 0.7–4.0)
MCH: 30.5 pg (ref 26.0–34.0)
MCHC: 34.6 g/dL (ref 30.0–36.0)
MCV: 88 fL (ref 78.0–100.0)
Monocytes Absolute: 0.3 K/uL (ref 0.1–1.0)
Monocytes Relative: 4 %
Neutro Abs: 7.9 K/uL — ABNORMAL HIGH (ref 1.7–7.7)
Neutrophils Relative %: 84 %
Platelets: 293 K/uL (ref 150–400)
RBC: 5.09 MIL/uL (ref 4.22–5.81)
RDW: 13.5 % (ref 11.5–15.5)
WBC: 9.4 K/uL (ref 4.0–10.5)

## 2016-05-30 LAB — COMPREHENSIVE METABOLIC PANEL WITH GFR
ALT: 14 U/L — ABNORMAL LOW (ref 17–63)
AST: 27 U/L (ref 15–41)
Albumin: 4.3 g/dL (ref 3.5–5.0)
Alkaline Phosphatase: 46 U/L (ref 38–126)
Anion gap: 9 (ref 5–15)
BUN: 15 mg/dL (ref 6–20)
CO2: 23 mmol/L (ref 22–32)
Calcium: 8.9 mg/dL (ref 8.9–10.3)
Chloride: 107 mmol/L (ref 101–111)
Creatinine, Ser: 0.97 mg/dL (ref 0.61–1.24)
GFR calc Af Amer: >60 mL/min
GFR calc non Af Amer: >60 mL/min
Glucose, Bld: 74 mg/dL (ref 65–99)
Potassium: 4 mmol/L (ref 3.5–5.1)
Sodium: 139 mmol/L (ref 135–145)
Total Bilirubin: 0.8 mg/dL (ref 0.3–1.2)
Total Protein: 7 g/dL (ref 6.5–8.1)

## 2016-05-30 LAB — ETHANOL: Alcohol, Ethyl (B): <5 mg/dL

## 2016-05-30 LAB — LIPASE, BLOOD: Lipase: 22 U/L (ref 11–51)

## 2016-05-30 MED ORDER — KETOROLAC TROMETHAMINE 30 MG/ML IJ SOLN
30.0000 mg | Freq: Once | INTRAMUSCULAR | Status: AC
  Administered 2016-05-30: 30 mg via INTRAVENOUS
  Filled 2016-05-30: qty 1

## 2016-05-30 MED ORDER — THIAMINE HCL 100 MG/ML IJ SOLN
100.0000 mg | Freq: Once | INTRAMUSCULAR | Status: AC
  Administered 2016-05-30: 100 mg via INTRAVENOUS
  Filled 2016-05-30: qty 2

## 2016-05-30 MED ORDER — ALBUTEROL SULFATE HFA 108 (90 BASE) MCG/ACT IN AERS
1.0000 | INHALATION_SPRAY | Freq: Once | RESPIRATORY_TRACT | Status: AC
  Administered 2016-05-30: 1 via RESPIRATORY_TRACT
  Filled 2016-05-30: qty 6.7

## 2016-05-30 MED ORDER — ONDANSETRON HCL 4 MG/2ML IJ SOLN
4.0000 mg | Freq: Once | INTRAMUSCULAR | Status: AC
  Administered 2016-05-30: 4 mg via INTRAVENOUS
  Filled 2016-05-30: qty 2

## 2016-05-30 MED ORDER — SODIUM CHLORIDE 0.9 % IV BOLUS (SEPSIS)
1000.0000 mL | Freq: Once | INTRAVENOUS | Status: AC
  Administered 2016-05-30: 1000 mL via INTRAVENOUS

## 2016-05-30 MED ORDER — FAMOTIDINE IN NACL 20-0.9 MG/50ML-% IV SOLN
20.0000 mg | Freq: Once | INTRAVENOUS | Status: AC
  Administered 2016-05-30: 20 mg via INTRAVENOUS
  Filled 2016-05-30: qty 50

## 2016-05-30 NOTE — ED Notes (Addendum)
Pt told EMS he drank about a gallon of liquor yesterday with last drink at 0400 this am. Pt woke up at 0800 with n/v. Pt alert and oriented. Does drink etoh daily.

## 2016-05-30 NOTE — Discharge Instructions (Signed)
Nausea and Vomiting °Nausea is a sick feeling that often comes before throwing up (vomiting). Vomiting is a reflex where stomach contents come out of your mouth. Vomiting can cause severe loss of body fluids (dehydration). Children and elderly adults can become dehydrated quickly, especially if they also have diarrhea. Nausea and vomiting are symptoms of a condition or disease. It is important to find the cause of your symptoms. °CAUSES  °· Direct irritation of the stomach lining. This irritation can result from increased acid production (gastroesophageal reflux disease), infection, food poisoning, taking certain medicines (such as nonsteroidal anti-inflammatory drugs), alcohol use, or tobacco use. °· Signals from the brain. These signals could be caused by a headache, heat exposure, an inner ear disturbance, increased pressure in the brain from injury, infection, a tumor, or a concussion, pain, emotional stimulus, or metabolic problems. °· An obstruction in the gastrointestinal tract (bowel obstruction). °· Illnesses such as diabetes, hepatitis, gallbladder problems, appendicitis, kidney problems, cancer, sepsis, atypical symptoms of a heart attack, or eating disorders. °· Medical treatments such as chemotherapy and radiation. °· Receiving medicine that makes you sleep (general anesthetic) during surgery. °DIAGNOSIS °Your caregiver may ask for tests to be done if the problems do not improve after a few days. Tests may also be done if symptoms are severe or if the reason for the nausea and vomiting is not clear. Tests may include: °· Urine tests. °· Blood tests. °· Stool tests. °· Cultures (to look for evidence of infection). °· X-rays or other imaging studies. °Test results can help your caregiver make decisions about treatment or the need for additional tests. °TREATMENT °You need to stay well hydrated. Drink frequently but in small amounts. You may wish to drink water, sports drinks, clear broth, or eat frozen  ice pops or gelatin dessert to help stay hydrated. When you eat, eating slowly may help prevent nausea. There are also some antinausea medicines that may help prevent nausea. °HOME CARE INSTRUCTIONS  °· Take all medicine as directed by your caregiver. °· If you do not have an appetite, do not force yourself to eat. However, you must continue to drink fluids. °· If you have an appetite, eat a normal diet unless your caregiver tells you differently. °¨ Eat a variety of complex carbohydrates (rice, wheat, potatoes, bread), lean meats, yogurt, fruits, and vegetables. °¨ Avoid high-fat foods because they are more difficult to digest. °· Drink enough water and fluids to keep your urine clear or pale yellow. °· If you are dehydrated, ask your caregiver for specific rehydration instructions. Signs of dehydration may include: °¨ Severe thirst. °¨ Dry lips and mouth. °¨ Dizziness. °¨ Dark urine. °¨ Decreasing urine frequency and amount. °¨ Confusion. °¨ Rapid breathing or pulse. °SEEK IMMEDIATE MEDICAL CARE IF:  °· You have blood or brown flecks (like coffee grounds) in your vomit. °· You have black or bloody stools. °· You have a severe headache or stiff neck. °· You are confused. °· You have severe abdominal pain. °· You have chest pain or trouble breathing. °· You do not urinate at least once every 8 hours. °· You develop cold or clammy skin. °· You continue to vomit for longer than 24 to 48 hours. °· You have a fever. °MAKE SURE YOU:  °· Understand these instructions. °· Will watch your condition. °· Will get help right away if you are not doing well or get worse. °  °This information is not intended to replace advice given to you by your health care provider. Make sure   you discuss any questions you have with your health care provider.   Document Released: 11/22/2005 Document Revised: 02/14/2012 Document Reviewed: 04/21/2011 Elsevier Interactive Patient Education 2016 [employer]   State Street CorporationCommunity Resource Guide  Outpatient Counseling/Substance Abuse Adult The United Ways 211 is a great source of information about community services available.  Access by dialing 2-1-1 from anywhere in [place], or by website -  PooledIncome.plwww.nc211.org.   Other Local Resources (Updated 12/2015)  Crisis Hotlines   Services     Area Served  [facility]  Crisis Hotline, available 24 hours a day, 7 days a week: [phone] [facility] [place], [place]   [facility]  Crisis Hotline, available 24 hours a day, 7 days a week: [phone]) [phone] [facility], [place]  [facility]  Suicide Prevention Hotline, available 24 hours a day, 7 days a week: 872-529-8531 [facility] [place], [place]  [facility]   Crisis Hotline, available 24 hours a day, 7 days a week: [phone] [phone] [facility], [place]   [facility] Access to [employer] CompanyCare Line  Crisis Hotline, available 24 hours a day, 7 days a week: 740-540-5514385 [phone] All   [facility]  Crisis Hotline, available 24 hours a day, 7 days a week: [phone] [phone] All   Other Local Resources (Updated 12/2015)  Outpatient Counseling/ Substance Abuse Programs  Services     Address and Phone Number  ADS (Alcohol and Drug Services)   Options include Individual counseling, group counseling, intensive outpatient program (several hours a day, several days a week)  Offers depression assessments  Provides methadone maintenance program [phone] [address], [address] [place], [place] [address]   [facility]   Offers partial hospitalization/day treatment and DUI/DWI programs  Saks Incorporatedccepts Medicare, private insurance [phone] [address], [address] [place], [place] [address]  [facility]    Services include intensive outpatient program (several hours a day, several days a week), outpatient treatment, DUI/DWI services, family education  Also has some services specifically for [employer]  Offers transitional housing  [phone] [address] [place], [place] [address]     [facility]  [employer] Incorporatedccepts Medicare, private pay, and private insurance [phone] [address], [address] [place], [place] [address]  [facility]  Services include individual counseling, substance abuse intensive outpatient program (several hours a day, several days a week), day treatment  Delene Lollccepts Medicare, Medicaid, private insurance [phone] [address], [address] [place], [place] [address]  [facility]   Offers substance abuse intensive outpatient program (several hours a day, several days a week), partial hospitalization program [phone] [address] [place], [place] [address]  [phone] [address] [place], [place] [address]  [phone] [address] [place], [place] [address]  [phone] [place] [address] [place], [place] [address]  [facility]  Individual counseling only  Accepts private insurance only [phone] [address], [address] [place], [place] [address]  [facility]  Methadone maintenance program [phone] [address] [place], [place] [address]  [facility]  Walk-In Clinic providing substance abuse and mental health counseling  Accepts Medicaid, Medicare, private insurance  Offers sliding scale for uninsured [phone] [address] [place], [place]   [facility], Avnetnc.  Offers individual counseling, and intensive in-home services [phone] [address], [address] [place], [place] [address]  [facility]  Offers individual counseling, family counseling, group therapy, domestic violence counseling, Psychiatric nurseconsumer credit counseling  Accepts Medicare, Medicaid, private insurance  Offers sliding scale for uninsured [phone] [address] [place], [place] [address]  [phone] [facility], [address] [place], [place] [address]  [facility]  Offers individual, family and group counseling  3 locations - [place], [place], and [place]  [phone]  [address] [place], [place] [address]  [address] [place], [place] [address]  [address] [place], [place] [address]  [facility]    Offers psychiatric assessment, 8-week Intensive Outpatient Program (several hours a day, several times a week, daytime or evenings), early recovery group, family Program, medication management  Private pay or private insurance only [phone], or  [phone] [address] [place], [place] [address]  [facility]  Offers individual, couples and family counseling  Accepts Medicaid, private insurance, and sliding scale for uninsured [phone] [address] [place], [place] [address]  Len Blalockavid Fuller, MD  Individual counseling  Private insurance [phone] [address] [place], [place] [address]  [facility]   Offers assessment, substance abuse treatment, and behavioral health treatment [phone] [address] [place], [place] [address]  [facility]  Individual counseling  Accepts private insurance [phone] [address] [place], [place] [address]  [facility]  Individual counseling  Delene Lollccepts Medicare, private insurance [phone] [address] [place], [place] [address]  [facility]    Offers intensive outpatient program (several hours a day, several times a week)  Private pay, private insurance [phone] [facility] [address] [place], [place]  [facility]  Individual counseling  Medicare, private insurance [phone] [address], [address] [place], [place] [address]  [facility]    Offers intensive outpatient program (several hours a day, several times a week) and partial hospitalization program  [phone] [address] [place], [place] [address]  Emerson MonteParrish McKinney, MD  Individual counseling [phone] [address], [address] [place], [place] [address]  [facility]  Offers Christian counseling to individuals, couples, and families  Accepts Medicare and private insurance; offers sliding scale for uninsured [phone] [address] [place], [place] [address]  [facility]  [facility] counseling [phone] [address], [address] [place], [place] [address]  [facility]   Offers crisis counseling, individual counseling, group therapy, in-home therapy, domestic violence services, day treatment, DWI services, Administrator, artsCommunity Support Team (CST), Assertive Community Treatment Team (ACTT), substance abuse Intensive Outpatient Program (several hours a day, several times a week)  2 locations - [place] and [place] [phone] [address] [place], [place] [address]  [phone] [address] [place], [place] [address]  [facility]     Individual counseling and group therapy  Accepts private insurance, ErmaMedicare, [place]Medicaid [phone] [address] [place], [place]  [facility]  Offers individual and family counseling  Offers LGBTQ services  Accepts private insurance and private pay [phone] [address] [place], [place] [address]  [facility]    Offers individual counseling, group therapy, and outpatient detox  Accepts private insurance [phone] [address] [place], [place]  [facility]  Individual counseling  Accepts Medicare, private insurance [phone] [address], [address] [place], [place] [address]  [facility]  Individual counseling  Accepts Medicare, private insurance [phone] [address] [place], [place] [address]  [facility]tnerZephan[facility]   Offers substance abuse Intensive  Outpatient Program (several hours a day, several times a week) [phone], or [phone] [place], [place]    Encourage cessation  of alcohol use. Your vomiting today was likely related to the amount of alcohol you consumed last night. Follow up with a primary care provider if your symptoms do not improve. Return to the ED if you experience severe worsening of your symptoms, blood in stool , fevers, loss of consciousness, seizures or tremors.

## 2016-05-30 NOTE — ED Notes (Signed)
[facility] Expected date:  Expected time:  Means of arrival:  Comments: EMS- hangover

## 2016-05-30 NOTE — ED Provider Notes (Signed)
CSN: [id number]     Arrival date & time 05/30/16  1309 History   First MD Initiated Contact with Patient 05/30/16 1314     Chief Complaint  Patient presents with  . Emesis  . Alcohol Problem     (Consider location/radiation/quality/duration/timing/severity/associated sxs/prior Treatment) HPI   Victor Burns is a 25 y.o M with no significant pmhx who presents to the ED today c/o N/V. Pt states that he drank 1 gallon of tequila Last night. When he woke up this morning he began experiencing nausea and vomiting. He reports he had 6 episodes of emesis that had bags of blood in it. He reports associated diffuse abdominal pain. He has not been able tolerate anything by mouth since this morning. Patient states that he is extruded similar symptoms in the past but it has not been this bad. He denies any seizures, tremors, dysuria, fever, melena, diarrhea or hematochezia.  Past Medical History  Diagnosis Date  . Asthma   . Abscess    History reviewed. No pertinent past surgical history. History reviewed. No pertinent family history. Social History  Substance Use Topics  . Smoking status: Current Some Day Smoker -- 1.00 packs/day    Types: Cigarettes  . Smokeless tobacco: None  . Alcohol Use: 0.0 oz/week    0 Standard drinks or equivalent per week     Comment: at least a beer a day    Review of Systems  All other systems reviewed and are negative.     Allergies  Review of patient's allergies indicates no known allergies.  Home Medications   Prior to Admission medications   Medication Sig Start Date End Date Taking? Authorizing Provider  albuterol (PROVENTIL HFA;VENTOLIN HFA) 108 (90 Base) MCG/ACT inhaler Inhale 2 puffs into the lungs every 4 (four) hours as needed for wheezing or shortness of breath. 04/03/16   Collene GobbleSteven A Daub, MD  escitalopram (LEXAPRO) 10 MG tablet Take 1 tablet (10 mg total) by mouth daily. 04/03/16   Collene GobbleSteven A Daub, MD   BP 144/72 mmHg  Pulse 79  Temp(Src) 98.7  F (37.1 C) (Oral)  Resp 18  SpO2 97% Physical Exam  Constitutional: He is oriented to person, place, and time. He appears well-developed and well-nourished. No distress.  HENT:  Head: Normocephalic and atraumatic.  Mouth/Throat: No oropharyngeal exudate.  Eyes: Conjunctivae and EOM are normal. Pupils are equal, round, and reactive to light. Right eye exhibits no discharge. Left eye exhibits no discharge. No scleral icterus.  Cardiovascular: Normal rate, regular rhythm, normal heart sounds and intact distal pulses.  Exam reveals no gallop and no friction rub.   No murmur heard. Pulmonary/Chest: Effort normal and breath sounds normal. No respiratory distress. He has no wheezes. He has no rales. He exhibits no tenderness.  Abdominal: Soft. Bowel sounds are normal. He exhibits no distension and no mass. There is tenderness ( diffuse). There is no rebound and no guarding.  Musculoskeletal: Normal range of motion. He exhibits no edema.  Neurological: He is alert and oriented to person, place, and time.  Skin: Skin is warm and dry. No rash noted. He is not diaphoretic. No erythema. No pallor.  Psychiatric: He has a normal mood and affect. His behavior is normal.  Nursing note and vitals reviewed.   ED Course  Procedures (including critical care time) Labs Review Labs Reviewed  CBC WITH DIFFERENTIAL/PLATELET  COMPREHENSIVE METABOLIC PANEL  LIPASE, BLOOD  URINALYSIS, ROUTINE W REFLEX MICROSCOPIC (NOT AT [facility])  ETHANOL    Imaging  Review No results found. I have personally reviewed and evaluated these images and lab results as part of my medical decision-making.   EKG Interpretation None      MDM   Final diagnoses:  Non-intractable vomiting with nausea, vomiting of unspecified type  Alcohol use (HCC)    25 y.o M presents to the ED c/o vomiting and abdominal pain after drinking 1 gallon of tequila last night. Pt appears well in the ED, no active vomiting. All VSS. Abd is soft,  but diffusely tender. No hx of seizures. No tremors on exam. Pt given IV fluids, zofran with significant symptomatic improvement. All lab work wnl. Ethanol <5. Pt now tolerating fluids >6oz in ED. He appears clinically sober. Discussed with pt that his symptoms are likely related to his alcohol use. Will d/c with outpatient alcohol use disorder resources. Return precautions outlined in patient discharge instructions.       Lester KinsmanSamantha Tripp ColdwaterDowless, PA-C 05/30/16 1643  Lyndal Pulleyaniel Knott, MD 05/31/16 [phone]

## 2016-05-30 NOTE — ED Notes (Signed)
Pt unable to give urine sample at present time. Nausea decreased. Gave pt sprite per request

## 2016-05-30 NOTE — ED Notes (Signed)
Bed: [id number] Expected date:  Expected time:  Means of arrival:  Comments: Hall C: IV ordered

## 2016-06-14 ENCOUNTER — Encounter (HOSPITAL_COMMUNITY): Payer: Self-pay

## 2016-06-14 ENCOUNTER — Emergency Department (HOSPITAL_COMMUNITY)
Admission: EM | Admit: 2016-06-14 | Discharge: 2016-06-15 | Disposition: A | Discharge status: Home / Self Care | Payer: Managed Care, Other (non HMO) | Attending: Emergency Medicine | Admitting: Emergency Medicine

## 2016-06-14 DIAGNOSIS — Z7951 Long term (current) use of inhaled steroids: Secondary | ICD-10-CM | POA: Insufficient documentation

## 2016-06-14 DIAGNOSIS — J45909 Unspecified asthma, uncomplicated: Secondary | ICD-10-CM | POA: Diagnosis not present

## 2016-06-14 DIAGNOSIS — F17210 Nicotine dependence, cigarettes, uncomplicated: Secondary | ICD-10-CM | POA: Insufficient documentation

## 2016-06-14 DIAGNOSIS — R110 Nausea: Secondary | ICD-10-CM | POA: Diagnosis not present

## 2016-06-14 DIAGNOSIS — K611 Rectal abscess: Secondary | ICD-10-CM

## 2016-06-14 DIAGNOSIS — L0231 Cutaneous abscess of buttock: Secondary | ICD-10-CM | POA: Diagnosis present

## 2016-06-14 NOTE — ED Notes (Signed)
Pt complains of an abcess around his rectum, no drainage at this time

## 2016-06-15 LAB — CBG MONITORING, ED: Glucose-Capillary: 103 mg/dL — ABNORMAL HIGH (ref 65–99)

## 2016-06-15 MED ORDER — LIDOCAINE-EPINEPHRINE 2 %-1:100000 IJ SOLN
10.0000 mL | Freq: Once | INTRAMUSCULAR | Status: AC
  Administered 2016-06-15: 10 mL
  Filled 2016-06-15: qty 10
  Filled 2016-06-15: qty 1

## 2016-06-15 MED ORDER — MORPHINE SULFATE (PF) 4 MG/ML IV SOLN
4.0000 mg | Freq: Once | INTRAVENOUS | Status: AC
  Administered 2016-06-15: 4 mg via INTRAMUSCULAR
  Filled 2016-06-15: qty 1

## 2016-06-15 MED ORDER — HYDROCODONE-ACETAMINOPHEN 5-325 MG PO TABS: 2.0000 | ORAL_TABLET | ORAL | Status: DC | PRN

## 2016-06-15 MED ORDER — ONDANSETRON 4 MG PO TBDP
4.0000 mg | ORAL_TABLET | Freq: Once | ORAL | Status: AC
  Administered 2016-06-15: 4 mg via ORAL
  Filled 2016-06-15: qty 1

## 2016-06-15 MED ORDER — CEPHALEXIN 500 MG PO CAPS: 500.0000 mg | ORAL_CAPSULE | Freq: Two times a day (BID) | ORAL | Status: DC

## 2016-06-15 MED ORDER — MORPHINE SULFATE (PF) 4 MG/ML IV SOLN: 4.0000 mg | Freq: Once | INTRAVENOUS | Status: DC

## 2016-06-15 MED ORDER — CEPHALEXIN 500 MG PO CAPS
500.0000 mg | ORAL_CAPSULE | Freq: Once | ORAL | Status: AC
  Administered 2016-06-15: 500 mg via ORAL
  Filled 2016-06-15: qty 1

## 2016-06-15 NOTE — ED Notes (Signed)
I&D completed by Elmarie Shileyiffany, PA assisted by 2 RNs.

## 2016-06-15 NOTE — ED Provider Notes (Signed)
CSN: [id number]     Arrival date & time 06/14/16  2137 History  By signing my name below, I, Freida Busmaniana Omoyeni, attest that this documentation has been prepared under the direction and in the presence of non-physician practitioner, Dorthula Matasiffany G Anahi Belmar, PA-C. Electronically Signed: Freida Busmaniana Omoyeni, Scribe. 06/15/2016. 1:31 AM.  Chief Complaint  Patient presents with  . Recurrent Skin Infections    The history is provided by the patient. No language interpreter was used.   HPI Comments:  Victor InfanteUrban Gadberry is a 25 y.o. male who presents to the Emergency Department complaining of an "abscess" to left buttock x ~ 2 days. He rates his pain a 10/10.   Pt reports associated nausea. Pt reports h/o same to the same region twice in the past. He notes the first episode required an I&D and the second he was able to drain on his own.  This episode is not as severe as the previous two. He denies fever and vomiting. No alleviating factors noted. His pain is exacerbated when he uses the bathroom.   Past Medical History  Diagnosis Date  . Asthma   . Abscess    History reviewed. No pertinent past surgical history. History reviewed. No pertinent family history. Social History  Substance Use Topics  . Smoking status: Current Some Day Smoker -- 1.00 packs/day    Types: Cigarettes  . Smokeless tobacco: None  . Alcohol Use: 0.0 oz/week    0 Standard drinks or equivalent per week     Comment: at least a beer a day    Review of Systems  Constitutional: Negative for fever.  Gastrointestinal: Positive for nausea. Negative for vomiting.  Skin: Positive for wound (abscess).  All other systems reviewed and are negative.   Allergies  Bee venom and Shellfish allergy  Home Medications   Prior to Admission medications   Medication Sig Start Date End Date Taking? Authorizing Provider  albuterol (PROVENTIL HFA;VENTOLIN HFA) 108 (90 Base) MCG/ACT inhaler Inhale 2 puffs into the lungs every 4 (four) hours as needed for  wheezing or shortness of breath. 04/03/16  Yes Collene GobbleSteven A Daub, MD  cephALEXin (KEFLEX) 500 MG capsule Take 1 capsule (500 mg total) by mouth 2 (two) times daily. 06/15/16   Maciah Feeback Neva SeatGreene, PA-C  escitalopram (LEXAPRO) 10 MG tablet Take 1 tablet (10 mg total) by mouth daily. Patient not taking: Reported on 05/30/2016 04/03/16   Collene GobbleSteven A Daub, MD  HYDROcodone-acetaminophen (NORCO/VICODIN) 5-325 MG tablet Take 2 tablets by mouth every 4 (four) hours as needed. 06/15/16   Smt. Loder Neva SeatGreene, PA-C   BP 127/72 mmHg  Pulse 65  Temp(Src) 99.3 F (37.4 C) (Oral)  Resp 18  Ht 5\' 11"  (1.803 m)  Wt 86.183 kg  BMI 26.51 kg/m2  SpO2 100% Physical Exam  Constitutional: He is oriented to person, place, and time. He appears well-developed and well-nourished. No distress.  HENT:  Head: Normocephalic and atraumatic.  Eyes: Conjunctivae are normal.  Cardiovascular: Normal rate.   Pulmonary/Chest: Effort normal.  Abdominal: He exhibits no distension. There is no tenderness. There is no rigidity and no guarding.  Genitourinary:  2 x 1 cm abscess to left lower glut. It does not involve the perineum or the rectum.  Neurological: He is alert and oriented to person, place, and time.  Skin: Skin is warm and dry.  Psychiatric: He has a normal mood and affect.  Nursing note and vitals reviewed.   ED Course  Procedures   DIAGNOSTIC STUDIES:  Oxygen Saturation is 100%  on RA, normal by my interpretation.    COORDINATION OF CARE:  1:31 AM Will order pain meds and perform I&D.  Discussed treatment plan with pt at bedside and pt agreed to plan.  Labs Review Labs Reviewed  CBG MONITORING, ED - Abnormal; Notable for the following:    Glucose-Capillary 103 (*)    All other components within normal limits    Imaging Review No results found. I have personally reviewed and evaluated these images and lab results as part of my medical decision-making.   EKG Interpretation None       INCISION AND DRAINAGE  PROCEDURE NOTE: Patient identification was confirmed and verbal consent was obtained. This procedure was performed by Dorthula Matas, PA-C at 1:33 AM. Site: left perirectal region Sterile procedures observed: yes - RN, Ward, A. assisting Needle size: 25 gauge Anesthetic used (type and amt): 2 cc Blade size: 12 Drainage: moderate Complexity: Complex Packing used: none Site anesthetized, incision made over site, wound drained and explored loculations, rinsed with copious amounts of normal saline, wound packed with sterile gauze, covered with dry, sterile dressing.  Pt tolerated procedure well without complications.  Instructions for care discussed verbally and pt provided with additional written instructions for homecare and f/u.   MDM   Final diagnoses:  Perirectal abscess    Patient with skin abscess. Incision and drainage performed in the ED today.  Abscess was not large enough to warrant packing or drain placement. Wound recheck in 2 days. Supportive care and return precautions discussed.  Pt sent home with keflex and Vicodin, referred to [facility] [place] [facility].. The patient appears reasonably screened and/or stabilized for discharge and I doubt any other emergent medical condition requiring further screening, evaluation, or treatment in the ED prior to discharge.  Blood pressure 127/72, pulse 65, temperature 99.3 F (37.4 C), temperature source Oral, resp. rate 18, height  (1.803 m), weight 86.183 kg, SpO2 100 %.   I personally performed the services described in this documentation, which was scribed in my presence. The recorded information has been reviewed and is accurate.     Marlon Pel, PA-C 06/15/16 1610  Dione Booze, MD 06/15/16 [id number]

## 2016-06-15 NOTE — Discharge Instructions (Signed)
Perirectal Abscess An abscess is an infected area that contains a collection of pus. A perirectal abscess is an abscess that is near the opening of the anus or around the rectum. A perirectal abscess can cause a lot of pain, especially during bowel movements. CAUSES This condition is almost always caused by an infection that starts in an anal gland. RISK FACTORS This condition is more likely to develop in:  People with diabetes or inflammatory bowel disease.  People whose body defense system (immune system) is weak.  People who have anal sex.  People who have a sexually transmitted disease (STD).  People who have certain kinds of cancers, such as rectal carcinoma, leukemia, or lymphoma. SYMPTOMS The main symptom of this condition is pain. The pain may be a throbbing pain that gets worse during bowel movements. Other symptoms include:  Fever.  Swelling.  Redness.  Bleeding.  Constipation. DIAGNOSIS The condition is diagnosed with a physical exam. If the abscess is not visible, a health care provider may need to place a finger inside the rectum to find the abscess. Sometimes, imaging tests are done to determine the size and location of the abscess. These tests may include:  An ultrasound.  An MRI.  A CT scan. TREATMENT This condition is usually treated with incision and drainage surgery. Incision and drainage surgery involves making an incision over the abscess to drain the pus. Treatment may also involve antibiotic medicine, pain medicine, stool softeners, or laxatives. HOME CARE INSTRUCTIONS  Take medicines only as directed by your health care provider.  If you were prescribed an antibiotic, finish all of it even if you start to feel better.  To relieve pain, try sitting:  In a warm, shallow bath (sitz bath).  On a heating pad with the setting on low.  On an inflatable donut-shaped cushion.  Follow any diet instructions as directed by your health care  provider.  Keep all follow-up visits as directed by your health care provider. This is important. SEEK MEDICAL CARE IF:  Your abscess is bleeding.  You have pain, swelling, or redness that is getting worse.  You are constipated.  You feel ill.  You have muscle aches or chills.  You have a fever.  Your symptoms return after the abscess has healed.   This information is not intended to replace advice given to you by your health care provider. Make sure you discuss any questions you have with your health care provider.   Document Released: 11/19/2000 Document Revised: 08/13/2015 Document Reviewed: 10/02/2014 Elsevier Interactive Patient Education 2016 [employer]  

## 2016-06-15 NOTE — ED Notes (Signed)
Patient reports abscess x3 days to his buttocks without drainage at this time, states when he "messes with it, it gets bigger". Patient boyfriend at bedside.

## 2016-08-10 ENCOUNTER — Emergency Department (HOSPITAL_COMMUNITY): Payer: Managed Care, Other (non HMO)

## 2016-08-10 ENCOUNTER — Emergency Department (HOSPITAL_COMMUNITY)
Admission: EM | Admit: 2016-08-10 | Discharge: 2016-08-10 | Disposition: A | Discharge status: Home / Self Care | Payer: Managed Care, Other (non HMO) | Attending: Emergency Medicine | Admitting: Emergency Medicine

## 2016-08-10 ENCOUNTER — Encounter (HOSPITAL_COMMUNITY): Payer: Self-pay

## 2016-08-10 DIAGNOSIS — J45909 Unspecified asthma, uncomplicated: Secondary | ICD-10-CM | POA: Insufficient documentation

## 2016-08-10 DIAGNOSIS — M542 Cervicalgia: Secondary | ICD-10-CM | POA: Insufficient documentation

## 2016-08-10 DIAGNOSIS — Z79899 Other long term (current) drug therapy: Secondary | ICD-10-CM | POA: Insufficient documentation

## 2016-08-10 DIAGNOSIS — R519 Headache, unspecified: Secondary | ICD-10-CM

## 2016-08-10 DIAGNOSIS — R202 Paresthesia of skin: Secondary | ICD-10-CM

## 2016-08-10 DIAGNOSIS — F17210 Nicotine dependence, cigarettes, uncomplicated: Secondary | ICD-10-CM | POA: Diagnosis not present

## 2016-08-10 DIAGNOSIS — R51 Headache: Secondary | ICD-10-CM | POA: Diagnosis present

## 2016-08-10 DIAGNOSIS — R52 Pain, unspecified: Secondary | ICD-10-CM

## 2016-08-10 LAB — CBC WITH DIFFERENTIAL/PLATELET
Basophils Absolute: 0 10*3/uL (ref 0.0–0.1)
Basophils Relative: 0 %
Eosinophils Absolute: 0 10*3/uL (ref 0.0–0.7)
Eosinophils Relative: 1 %
HEMATOCRIT: 39.8 % (ref 39.0–52.0)
HEMOGLOBIN: 13.9 g/dL (ref 13.0–17.0)
LYMPHS PCT: 33 %
Lymphs Abs: 1.6 10*3/uL (ref 0.7–4.0)
MCH: 30.5 pg (ref 26.0–34.0)
MCHC: 34.9 g/dL (ref 30.0–36.0)
MCV: 87.5 fL (ref 78.0–100.0)
MONOS PCT: 8 %
Monocytes Absolute: 0.4 10*3/uL (ref 0.1–1.0)
NEUTROS ABS: 2.9 10*3/uL (ref 1.7–7.7)
NEUTROS PCT: 58 %
Platelets: 266 10*3/uL (ref 150–400)
RBC: 4.55 MIL/uL (ref 4.22–5.81)
RDW: 12.7 % (ref 11.5–15.5)
WBC: 4.9 10*3/uL (ref 4.0–10.5)

## 2016-08-10 LAB — COMPREHENSIVE METABOLIC PANEL
ALBUMIN: 4.3 g/dL (ref 3.5–5.0)
ALK PHOS: 49 U/L (ref 38–126)
ALT: 14 U/L — ABNORMAL LOW (ref 17–63)
ANION GAP: 5 (ref 5–15)
AST: 21 U/L (ref 15–41)
BUN: 13 mg/dL (ref 6–20)
CALCIUM: 9.1 mg/dL (ref 8.9–10.3)
CHLORIDE: 107 mmol/L (ref 101–111)
CO2: 26 mmol/L (ref 22–32)
Creatinine, Ser: 1.02 mg/dL (ref 0.61–1.24)
GFR calc non Af Amer: >60 mL/min (ref >60)
GLUCOSE: 93 mg/dL (ref 65–99)
POTASSIUM: 3.6 mmol/L (ref 3.5–5.1)
SODIUM: 138 mmol/L (ref 135–145)
Total Bilirubin: 0.5 mg/dL (ref 0.3–1.2)
Total Protein: 7.3 g/dL (ref 6.5–8.1)

## 2016-08-10 MED ORDER — LORAZEPAM 2 MG/ML IJ SOLN
1.0000 mg | Freq: Once | INTRAMUSCULAR | Status: AC
  Administered 2016-08-10: 1 mg via INTRAVENOUS
  Filled 2016-08-10: qty 1

## 2016-08-10 NOTE — ED Notes (Signed)
MD at bedside. 

## 2016-08-10 NOTE — ED Provider Notes (Signed)
[facility]-EMERGENCY DEPT Provider Note   CSN: [id number] Arrival date & time: 08/10/16  1505     History   Chief Complaint Chief Complaint  Patient presents with  . Headache    HPI Victor Burns is a 25 y.o. male.  The history is provided by the patient. No language interpreter was used.  Headache      Victor Burns is a 25 y.o. male who presents to the Emergency Department complaining of headache.  He has 4-5 days of intermittent headache and neck pain. Several weeks ago he had an injury to his neck where his neck was flexed forward and he felt a pop. Since that time he's had intermittent demented popping and pain in his neck. Over the last 4-5 days he's been having remittent headaches with upper body weakness greater than lower body weakness and tingling in bilateral hands. Today he endorses tingling in the left foot. He denies any fevers, cough, shortness of breath, chest pain, vomiting, diarrhea. Today when he spit there was blood present and is not sure where it came from.  Past Medical History:  Diagnosis Date  . Abscess   . Asthma     There are no active problems to display for this patient.   History reviewed. No pertinent surgical history.     Home Medications    Prior to Admission medications   Medication Sig Start Date End Date Taking? Authorizing Provider  albuterol (PROVENTIL HFA;VENTOLIN HFA) 108 (90 Base) MCG/ACT inhaler Inhale 2 puffs into the lungs every 4 (four) hours as needed for wheezing or shortness of breath. 04/03/16  Yes Collene Gobble, MD  cephALEXin (KEFLEX) 500 MG capsule Take 1 capsule (500 mg total) by mouth 2 (two) times daily. Patient not taking: Reported on 08/10/2016 06/15/16   Marlon Pel, PA-C  escitalopram (LEXAPRO) 10 MG tablet Take 1 tablet (10 mg total) by mouth daily. Patient not taking: Reported on 05/30/2016 04/03/16   Collene Gobble, MD  HYDROcodone-acetaminophen (NORCO/VICODIN) 5-325 MG tablet Take 2 tablets by mouth every 4 (four)  hours as needed. Patient not taking: Reported on 08/10/2016 06/15/16   Marlon Pel, PA-C    Family History History reviewed. No pertinent family history.  Social History Social History  Substance Use Topics  . Smoking status: Current Some Day Smoker    Packs/day: 1.00    Types: Cigarettes  . Smokeless tobacco: Never Used  . Alcohol use 0.0 oz/week     Comment: at least a beer a day     Allergies   Bee venom and Shellfish allergy   Review of Systems Review of Systems  Neurological: Positive for headaches.  All other systems reviewed and are negative.    Physical Exam Updated Vital Signs BP 123/78 (BP Location: Right Arm)   Pulse (!) 58   Temp 97.8 F (36.6 C) (Oral)   Resp 17   Ht 6' (1.829 m)   Wt 185 lb (83.9 kg)   SpO2 100%   BMI 25.09 kg/m   Physical Exam  Constitutional: He is oriented to person, place, and time. He appears well-developed and well-nourished.  HENT:  Head: Normocephalic and atraumatic.  Eyes: EOM are normal. Pupils are equal, round, and reactive to light.  Neck:  Tenderness to palpation over the left lateral lower C-spine.  Cardiovascular: Normal rate and regular rhythm.   No murmur heard. Pulmonary/Chest: Effort normal and breath sounds normal. No respiratory distress.  Abdominal: Soft. There is no tenderness. There is no rebound and  no guarding.  Musculoskeletal: He exhibits no edema or tenderness.  Neurological: He is alert and oriented to person, place, and time. No cranial nerve deficit. He exhibits normal muscle tone. Coordination normal.  5/5 strength in all four extremities, sensation to light touch intact in all four extremities.    Skin: Skin is warm and dry.  Psychiatric: He has a normal mood and affect. His behavior is normal.  Nursing note and vitals reviewed.    ED Treatments / Results  Labs (all labs ordered are listed, but only abnormal results are displayed) Labs Reviewed  COMPREHENSIVE METABOLIC PANEL - Abnormal;  Notable for the following:       Result Value   ALT 14 (*)    All other components within normal limits  CBC WITH DIFFERENTIAL/PLATELET    EKG  EKG Interpretation None       Radiology Ct Head Wo Contrast  Result Date: 08/10/2016 CLINICAL DATA:  Popping injury in the neck several weeks ago, intermittent headaches no for 3 days. The patient was apparently not able to undergo MRI EXAM: CT HEAD WITHOUT CONTRAST CT CERVICAL SPINE WITHOUT CONTRAST TECHNIQUE: Multidetector CT imaging of the head and cervical spine was performed following the standard protocol without intravenous contrast. Multiplanar CT image reconstructions of the cervical spine were also generated. COMPARISON:  None. FINDINGS: CT HEAD FINDINGS Brain: The brainstem, cerebellum, cerebral peduncles, thalami, basal ganglia, basilar cisterns, and ventricular system appear within normal limits. No intracranial hemorrhage, mass lesion, or acute CVA. Vascular: Unremarkable Skull: Unremarkable Sinuses/Orbits: Chronic ethmoid and sphenoid sinusitis noted bilaterally. Other: No supplemental non-categorized findings. CT CERVICAL SPINE FINDINGS Alignment: Unremarkable Skull base and vertebrae: Unremarkable Soft tissues and spinal canal: Unremarkable Disc levels: Questionable soft tissue accentuation in the right neural foramina at C4-5 and to a lesser extent at C5-6 -disc protrusions not excluded, correlate with the site of the patient's extremity symptoms. Upper chest: Unremarkable Other: No supplemental non-categorized findings. IMPRESSION: 1. No acute intracranial findings. No cervical spine fracture or subluxation. 2. Questionable soft tissue accentuation in the right neural foramina at C4-5 and to a lesser extent at C5-6. Foraminal disc protrusions not readily excluded in this circumstance. If the patient's symptoms are right-sided then this might further reinforce this possibility. 3. Mild chronic ethmoid and sphenoid sinusitis. Electronically  Signed   By: Gaylyn Rong M.D.   On: 08/10/2016 21:33   Ct Cervical Spine Wo Contrast  Result Date: 08/10/2016 CLINICAL DATA:  Popping injury in the neck several weeks ago, intermittent headaches no for 3 days. The patient was apparently not able to undergo MRI EXAM: CT HEAD WITHOUT CONTRAST CT CERVICAL SPINE WITHOUT CONTRAST TECHNIQUE: Multidetector CT imaging of the head and cervical spine was performed following the standard protocol without intravenous contrast. Multiplanar CT image reconstructions of the cervical spine were also generated. COMPARISON:  None. FINDINGS: CT HEAD FINDINGS Brain: The brainstem, cerebellum, cerebral peduncles, thalami, basal ganglia, basilar cisterns, and ventricular system appear within normal limits. No intracranial hemorrhage, mass lesion, or acute CVA. Vascular: Unremarkable Skull: Unremarkable Sinuses/Orbits: Chronic ethmoid and sphenoid sinusitis noted bilaterally. Other: No supplemental non-categorized findings. CT CERVICAL SPINE FINDINGS Alignment: Unremarkable Skull base and vertebrae: Unremarkable Soft tissues and spinal canal: Unremarkable Disc levels: Questionable soft tissue accentuation in the right neural foramina at C4-5 and to a lesser extent at C5-6 -disc protrusions not excluded, correlate with the site of the patient's extremity symptoms. Upper chest: Unremarkable Other: No supplemental non-categorized findings. IMPRESSION: 1. No acute intracranial findings. No  cervical spine fracture or subluxation. 2. Questionable soft tissue accentuation in the right neural foramina at C4-5 and to a lesser extent at C5-6. Foraminal disc protrusions not readily excluded in this circumstance. If the patient's symptoms are right-sided then this might further reinforce this possibility. 3. Mild chronic ethmoid and sphenoid sinusitis. Electronically Signed   By: Gaylyn RongWalter  Liebkemann M.D.   On: 08/10/2016 21:33    Procedures Procedures (including critical care  time)  Medications Ordered in ED Medications  LORazepam (ATIVAN) injection 1 mg (1 mg Intravenous Given 08/10/16 2027)     Initial Impression / Assessment and Plan / ED Course  I have reviewed the triage vital signs and the nursing notes.  Pertinent labs & imaging results that were available during my care of the patient were reviewed by me and considered in my medical decision making (see chart for details).  Clinical Course    Patient here for evaluation of progressive weakness of arms and tingling in hands as well as neck pain and headache. He states he sustained an injury to his neck 2 weeks ago with forced flexion. He is neurovascularly intact on examination. CT head is negative for acute abnormality. CT C-spine was some possible changes to the right region but patient does not have any localizable pain or symptoms of this isolated area. Discussed recommendation of MRI to rule out central cord syndrome based on his symptoms and history but patient could not tolerate MRI despite Ativan administration.  Discussed with patient neurology follow-up and PCP follow-up. Discussed home care and return precautions.  On repeat assessment in the emergency department his headache is fully resolved. Final Clinical Impressions(s) / ED Diagnoses   Final diagnoses:  Neck pain  Bad headache  Paresthesia of both hands    New Prescriptions Discharge Medication List as of 08/10/2016 10:52 PM       Tilden FossaElizabeth Javionna Leder, MD 08/11/16 [phone]

## 2016-08-10 NOTE — ED Notes (Signed)
Patient states that after playing around with his friend the other week he had pop sensation in neck and that's when headaches and tingling sensations started.

## 2016-08-10 NOTE — ED Triage Notes (Addendum)
Pt c/o of intermittent headache for the last 3 days. Also endorsing tingling in fingers. Pt also states that when he was using the bathroom this morning, he spit up some blood. Ambulatory. A&Ox4.

## 2016-08-10 NOTE — ED Notes (Signed)
Pt transported to MRI 

## 2016-09-20 ENCOUNTER — Encounter (HOSPITAL_COMMUNITY): Payer: Self-pay | Admitting: *Deleted

## 2016-09-20 ENCOUNTER — Emergency Department (HOSPITAL_COMMUNITY): Payer: Managed Care, Other (non HMO)

## 2016-09-20 DIAGNOSIS — J45909 Unspecified asthma, uncomplicated: Secondary | ICD-10-CM | POA: Diagnosis not present

## 2016-09-20 DIAGNOSIS — F17210 Nicotine dependence, cigarettes, uncomplicated: Secondary | ICD-10-CM | POA: Insufficient documentation

## 2016-09-20 DIAGNOSIS — R0789 Other chest pain: Secondary | ICD-10-CM | POA: Diagnosis not present

## 2016-09-20 DIAGNOSIS — R079 Chest pain, unspecified: Secondary | ICD-10-CM | POA: Diagnosis present

## 2016-09-20 DIAGNOSIS — Z79899 Other long term (current) drug therapy: Secondary | ICD-10-CM | POA: Diagnosis not present

## 2016-09-20 LAB — BASIC METABOLIC PANEL
Anion gap: 6 (ref 5–15)
BUN: 12 mg/dL (ref 6–20)
CALCIUM: 9.2 mg/dL (ref 8.9–10.3)
CO2: 23 mmol/L (ref 22–32)
CREATININE: 1.14 mg/dL (ref 0.61–1.24)
Chloride: 111 mmol/L (ref 101–111)
GFR calc Af Amer: >60 mL/min (ref >60)
GLUCOSE: 96 mg/dL (ref 65–99)
POTASSIUM: 4.3 mmol/L (ref 3.5–5.1)
Sodium: 140 mmol/L (ref 135–145)

## 2016-09-20 LAB — CBC
HCT: 41 % (ref 39.0–52.0)
Hemoglobin: 13.8 g/dL (ref 13.0–17.0)
MCH: 30.3 pg (ref 26.0–34.0)
MCHC: 33.7 g/dL (ref 30.0–36.0)
MCV: 89.9 fL (ref 78.0–100.0)
Platelets: 305 10*3/uL (ref 150–400)
RBC: 4.56 MIL/uL (ref 4.22–5.81)
RDW: 13 % (ref 11.5–15.5)
WBC: 4.5 10*3/uL (ref 4.0–10.5)

## 2016-09-20 LAB — I-STAT TROPONIN, ED: Troponin i, poc: 0.01 ng/mL (ref 0.00–0.08)

## 2016-09-20 NOTE — ED Triage Notes (Signed)
Pt complains of chest pain for the past week. Pt states the pain became worse today. Pt states pain is worse with movement. Pt states he also has shortness of breath and dizziness.

## 2016-09-21 ENCOUNTER — Emergency Department (HOSPITAL_COMMUNITY)
Admission: EM | Admit: 2016-09-21 | Discharge: 2016-09-21 | Disposition: A | Discharge status: Home / Self Care | Payer: Managed Care, Other (non HMO) | Attending: Emergency Medicine | Admitting: Emergency Medicine

## 2016-09-21 DIAGNOSIS — R0789 Other chest pain: Secondary | ICD-10-CM

## 2016-09-21 MED ORDER — TRAMADOL HCL 50 MG PO TABS: 50.0000 mg | ORAL_TABLET | Freq: Once | ORAL | Status: DC

## 2016-09-21 MED ORDER — CYCLOBENZAPRINE HCL 10 MG PO TABS: 10.0000 mg | ORAL_TABLET | Freq: Two times a day (BID) | ORAL | 0 refills | Status: DC | PRN

## 2016-09-21 MED ORDER — PREDNISONE 20 MG PO TABS: 40.0000 mg | ORAL_TABLET | Freq: Every day | ORAL | 0 refills | Status: DC

## 2016-09-21 MED ORDER — IPRATROPIUM-ALBUTEROL 0.5-2.5 (3) MG/3ML IN SOLN
3.0000 mL | Freq: Once | RESPIRATORY_TRACT | Status: AC
  Administered 2016-09-21: 3 mL via RESPIRATORY_TRACT
  Filled 2016-09-21: qty 3

## 2016-09-21 MED ORDER — KETOROLAC TROMETHAMINE 60 MG/2ML IM SOLN
60.0000 mg | Freq: Once | INTRAMUSCULAR | Status: AC
  Administered 2016-09-21: 60 mg via INTRAMUSCULAR
  Filled 2016-09-21: qty 2

## 2016-09-21 MED ORDER — ALBUTEROL SULFATE HFA 108 (90 BASE) MCG/ACT IN AERS
2.0000 | INHALATION_SPRAY | Freq: Once | RESPIRATORY_TRACT | Status: AC
  Administered 2016-09-21: 2 via RESPIRATORY_TRACT
  Filled 2016-09-21: qty 6.7

## 2016-09-21 MED ORDER — IBUPROFEN 800 MG PO TABS: 800.0000 mg | ORAL_TABLET | Freq: Three times a day (TID) | ORAL | 0 refills | Status: DC

## 2016-09-21 MED ORDER — PREDNISONE 20 MG PO TABS
60.0000 mg | ORAL_TABLET | Freq: Once | ORAL | Status: AC
  Administered 2016-09-21: 60 mg via ORAL
  Filled 2016-09-21: qty 3

## 2016-09-21 NOTE — Discharge Instructions (Signed)
Follow-up with your regular doctor as needed 

## 2016-09-21 NOTE — ED Provider Notes (Signed)
[facility]-EMERGENCY DEPT Provider Note   CSN: [id number] Arrival date & time: 09/20/16  2125     History   Chief Complaint Chief Complaint  Patient presents with  . Chest Pain    HPI Victor Burns is a 25 y.o. male.  The history is provided by the patient.  Chest Pain   This is a new problem. The current episode started more than 1 week ago (2 weeks). The problem occurs constantly. The problem has not changed since onset.The pain is associated with coughing, breathing and movement. Pain location: left anterior ribs. The pain is at a severity of 7/10. The pain is severe. The quality of the pain is described as stabbing. The pain does not radiate. Duration of episode(s) is 2 weeks. The symptoms are aggravated by deep breathing (palpation and movement). Associated symptoms include cough (cough x 3 days when CP began, cough is now resolved). Pertinent negatives include no abdominal pain, no back pain, no diaphoresis, no dizziness, no exertional chest pressure, no fever, no headaches, no hemoptysis, no irregular heartbeat, no leg pain, no lower extremity edema, no malaise/fatigue, no nausea, no near-syncope, no numbness, no orthopnea, no palpitations, no PND, no shortness of breath, no sputum production, no syncope, no vomiting and no weakness. He has tried nothing for the symptoms. The treatment provided no relief. Risk factors include male gender.    Past Medical History:  Diagnosis Date  . Abscess   . Asthma     There are no active problems to display for this patient.   History reviewed. No pertinent surgical history.     Home Medications    Prior to Admission medications   Medication Sig Start Date End Date Taking? Authorizing Provider  albuterol (PROVENTIL HFA;VENTOLIN HFA) 108 (90 Base) MCG/ACT inhaler Inhale 2 puffs into the lungs every 4 (four) hours as needed for wheezing or shortness of breath. 04/03/16   Collene GobbleSteven A Daub, MD  cephALEXin (KEFLEX) 500 MG capsule Take 1 capsule  (500 mg total) by mouth 2 (two) times daily. Patient not taking: Reported on 08/10/2016 06/15/16   Marlon Peliffany Greene, PA-C  cyclobenzaprine (FLEXERIL) 10 MG tablet Take 1 tablet (10 mg total) by mouth 2 (two) times daily as needed for muscle spasms. 09/21/16   Danelle BerryLeisa Coralee Edberg, PA-C  escitalopram (LEXAPRO) 10 MG tablet Take 1 tablet (10 mg total) by mouth daily. Patient not taking: Reported on 05/30/2016 04/03/16   Collene GobbleSteven A Daub, MD  HYDROcodone-acetaminophen (NORCO/VICODIN) 5-325 MG tablet Take 2 tablets by mouth every 4 (four) hours as needed. Patient not taking: Reported on 08/10/2016 06/15/16   Marlon Peliffany Greene, PA-C  ibuprofen (ADVIL,MOTRIN) 800 MG tablet Take 1 tablet (800 mg total) by mouth 3 (three) times daily. 09/21/16   Danelle BerryLeisa Tifani Dack, PA-C  predniSONE (DELTASONE) 20 MG tablet Take 2 tablets (40 mg total) by mouth daily. 09/21/16   Danelle BerryLeisa Lynnette Pote, PA-C    Family History No family history on file.  Social History Social History  Substance Use Topics  . Smoking status: Current Some Day Smoker    Packs/day: 1.00    Types: Cigarettes  . Smokeless tobacco: Never Used  . Alcohol use 0.0 oz/week     Comment: at least a beer a day     Allergies   Bee venom and Shellfish allergy   Review of Systems Review of Systems  Constitutional: Negative for diaphoresis, fever and malaise/fatigue.  Respiratory: Positive for cough (cough x 3 days when CP began, cough is now resolved). Negative for hemoptysis, sputum  production and shortness of breath.   Cardiovascular: Positive for chest pain. Negative for palpitations, orthopnea, syncope, PND and near-syncope.  Gastrointestinal: Negative for abdominal pain, nausea and vomiting.  Musculoskeletal: Negative for back pain.  Neurological: Negative for dizziness, weakness, numbness and headaches.  All other systems reviewed and are negative.    Physical Exam Updated Vital Signs BP 139/73 (BP Location: Left Arm)   Pulse (!) 50   Temp 99.9 F (37.7 C) (Oral)    Resp 18   Ht 5\' 11"  (1.803 m)   Wt 74.5 kg   SpO2 99%   BMI 22.91 kg/m   Physical Exam  Constitutional: He is oriented to person, place, and time. He appears well-developed and well-nourished. No distress.  HENT:  Head: Normocephalic and atraumatic.  Nose: Nose normal.  Mouth/Throat: Oropharynx is clear and moist. No oropharyngeal exudate.  Eyes: Conjunctivae and EOM are normal. Pupils are equal, round, and reactive to light. Right eye exhibits no discharge. Left eye exhibits no discharge. No scleral icterus.  Neck: Normal range of motion. Neck supple. No JVD present.  Cardiovascular: Normal rate, regular rhythm, normal heart sounds and intact distal pulses.  Exam reveals no gallop and no friction rub.   No murmur heard.   Pulmonary/Chest: Effort normal and breath sounds normal. No stridor. No respiratory distress. He has no wheezes. He has no rales. He exhibits tenderness.  Abdominal: Soft. Bowel sounds are normal. He exhibits no distension and no mass. There is no tenderness. There is no rebound and no guarding.  Musculoskeletal: Normal range of motion. He exhibits no edema or tenderness.  Neurological: He is alert and oriented to person, place, and time. He has normal reflexes. He exhibits normal muscle tone. Coordination normal.  Skin: Skin is warm and dry. Capillary refill takes less than 2 seconds. No rash noted. He is not diaphoretic. No erythema. No pallor.  Psychiatric: He has a normal mood and affect. His behavior is normal. Judgment and thought content normal.  Nursing note and vitals reviewed.    ED Treatments / Results  Labs (all labs ordered are listed, but only abnormal results are displayed) Labs Reviewed  BASIC METABOLIC PANEL  CBC  I-STAT TROPOININ, ED    EKG  EKG Interpretation  Date/Time:  Monday September 20 2016 21:50:04 EDT Ventricular Rate:  49 PR Interval:    QRS Duration: 93 QT Interval:  418 QTC Calculation: 378 R Axis:   82 Text  Interpretation:  Sinus bradycardia ST elevation consistent with early repolarization Baseline wander in lead(s) I Confirmed by Lincoln Brigham [phone]) on 09/21/2016 8:20:58 PM       Radiology Dg Chest 2 View  Result Date: 09/20/2016 CLINICAL DATA:  Subacute onset of left-sided chest pain and swelling. Nausea and shortness of breath. Cough. Initial encounter. EXAM: CHEST  2 VIEW COMPARISON:  Chest radiograph performed 04/16/2015 FINDINGS: The lungs are well-aerated and clear. There is no evidence of focal opacification, pleural effusion or pneumothorax. The heart is normal in size; the mediastinal contour is within normal limits. No acute osseous abnormalities are seen. IMPRESSION: No acute cardiopulmonary process seen. Electronically Signed   By: Roanna Raider M.D.   On: 09/20/2016 22:58    Procedures Procedures (including critical care time)  Medications Ordered in ED Medications  ipratropium-albuterol (DUONEB) 0.5-2.5 (3) MG/3ML nebulizer solution 3 mL (not administered)  albuterol (PROVENTIL HFA;VENTOLIN HFA) 108 (90 Base) MCG/ACT inhaler 2 puff (2 puffs Inhalation Given 09/21/16 0308)  ketorolac (TORADOL) injection 60 mg (60 mg  Intramuscular Given 09/21/16 0302)  predniSONE (DELTASONE) tablet 60 mg (60 mg Oral Given 09/21/16 0307)     Initial Impression / Assessment and Plan / ED Course  I have reviewed the triage vital signs and the nursing notes.  Pertinent labs & imaging results that were available during my care of the patient were reviewed by me and considered in my medical decision making (see chart for details).  Clinical Course   Healthy 25 y/o male with CP secondary to cough a few days ago that has resolved, CP is left chest wall and ribs, sharp, worse with inspiration and is reproducible with movement or palpation.  Presentation is not concerning for ACS, doubt PE - pt is PERC negative, work up in the ER is unremarkable including negative troponin, negative chest x-ray, EKG  shows sinus bradycardia with early repolarization, no ischemic changes.  Suspect chest pain is costochondritis or pleurisy.  Will give NSAID, steroids and muscle relaxers.  The patient is safe and stable to discharge home. Encouraged to follow-up with his PCP  Final Clinical Impressions(s) / ED Diagnoses   Final diagnoses:  Chest wall pain    New Prescriptions New Prescriptions   CYCLOBENZAPRINE (FLEXERIL) 10 MG TABLET    Take 1 tablet (10 mg total) by mouth 2 (two) times daily as needed for muscle spasms.   IBUPROFEN (ADVIL,MOTRIN) 800 MG TABLET    Take 1 tablet (800 mg total) by mouth 3 (three) times daily.   PREDNISONE (DELTASONE) 20 MG TABLET    Take 2 tablets (40 mg total) by mouth daily.     Danelle Berry, PA-C 10/17/16 2220    Geoffery Lyons, MD 10/18/16 [phone]

## 2016-10-22 IMAGING — CR DG CHEST 2V
2 series · 2 of 2 positions shown · non-contrast
Comparison: Chest radiograph performed 04/16/2015

CLINICAL DATA: Subacute onset of left-sided chest pain and
swelling. Nausea and shortness of breath. Cough. Initial encounter.

EXAM:
CHEST  2 VIEW

[w chest pa]
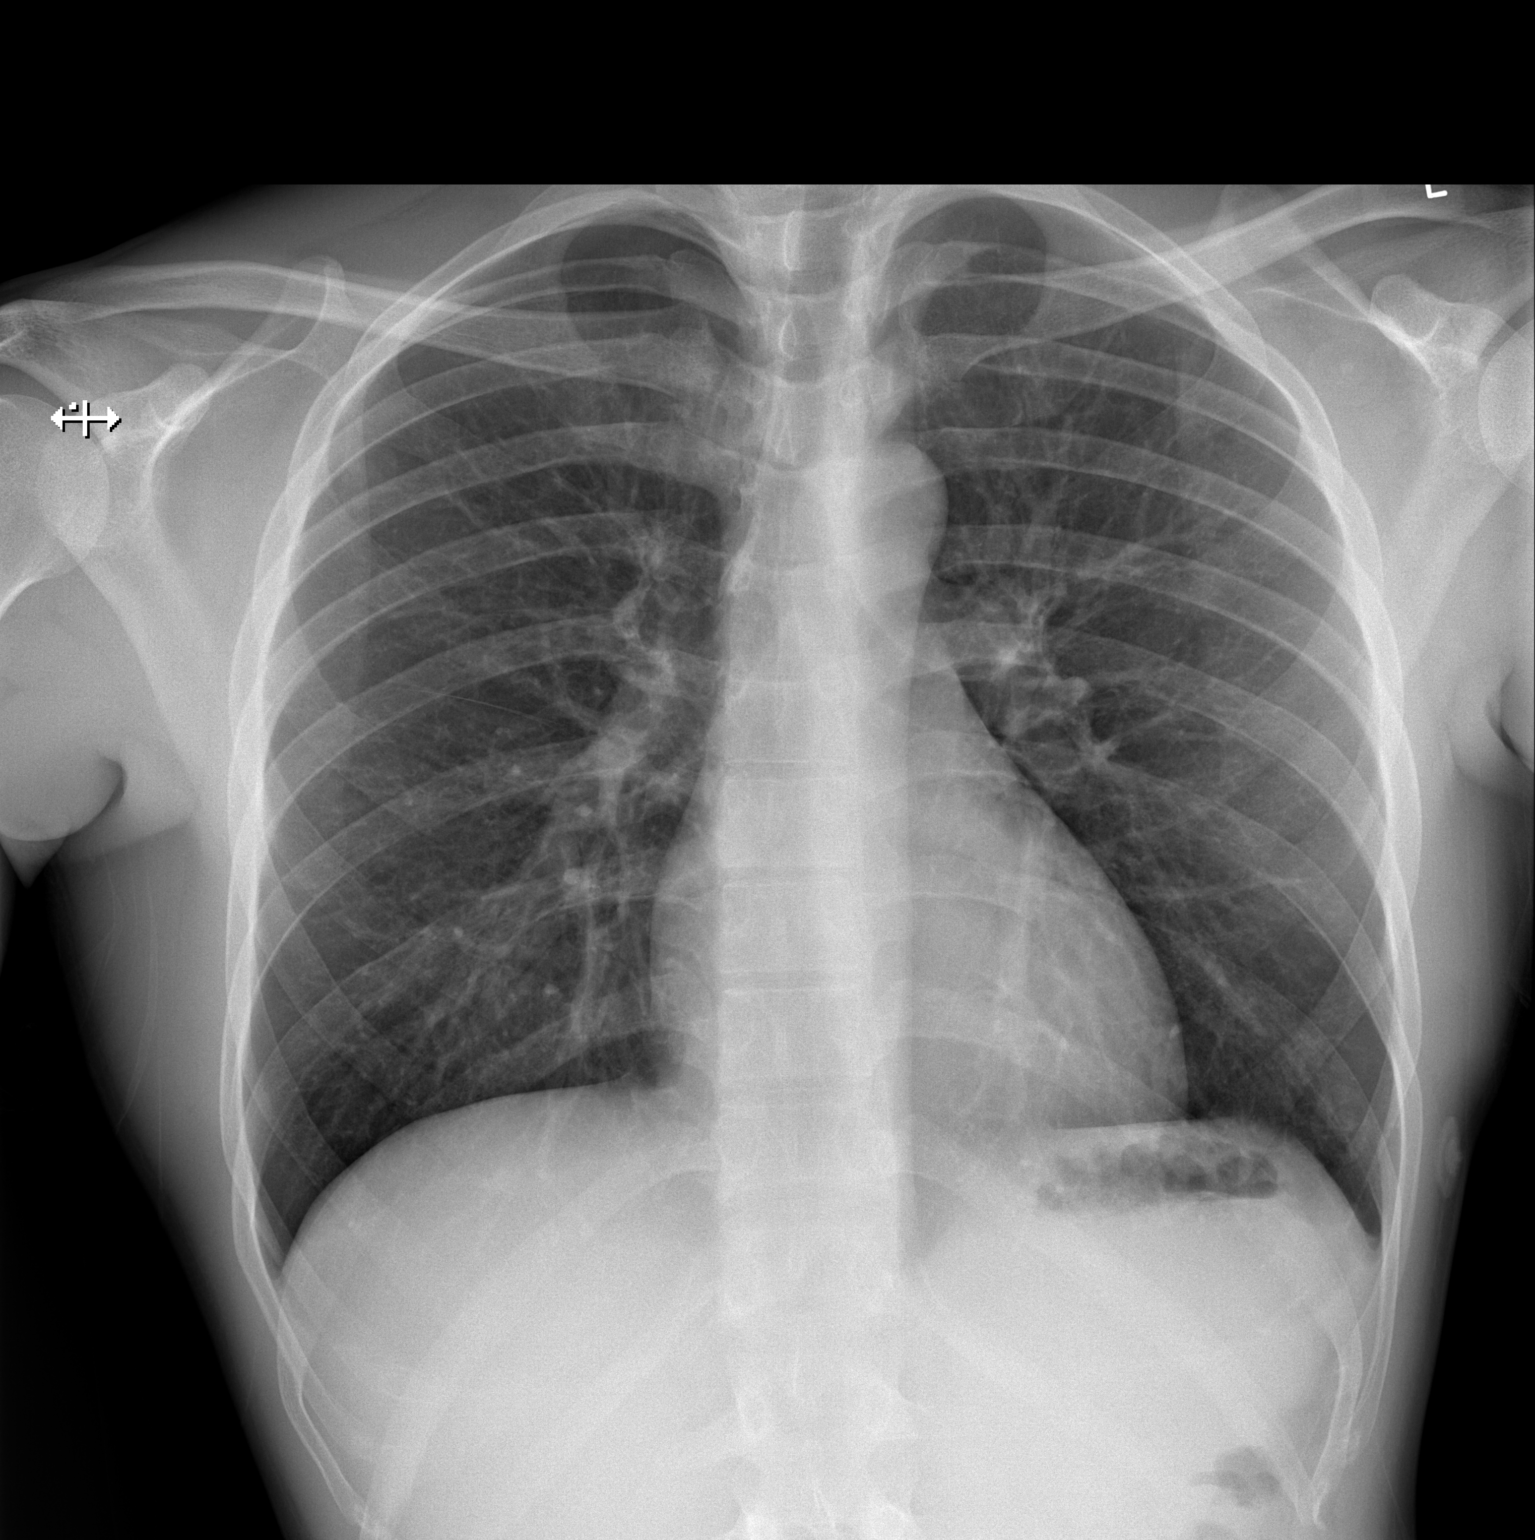

[w chest lat]
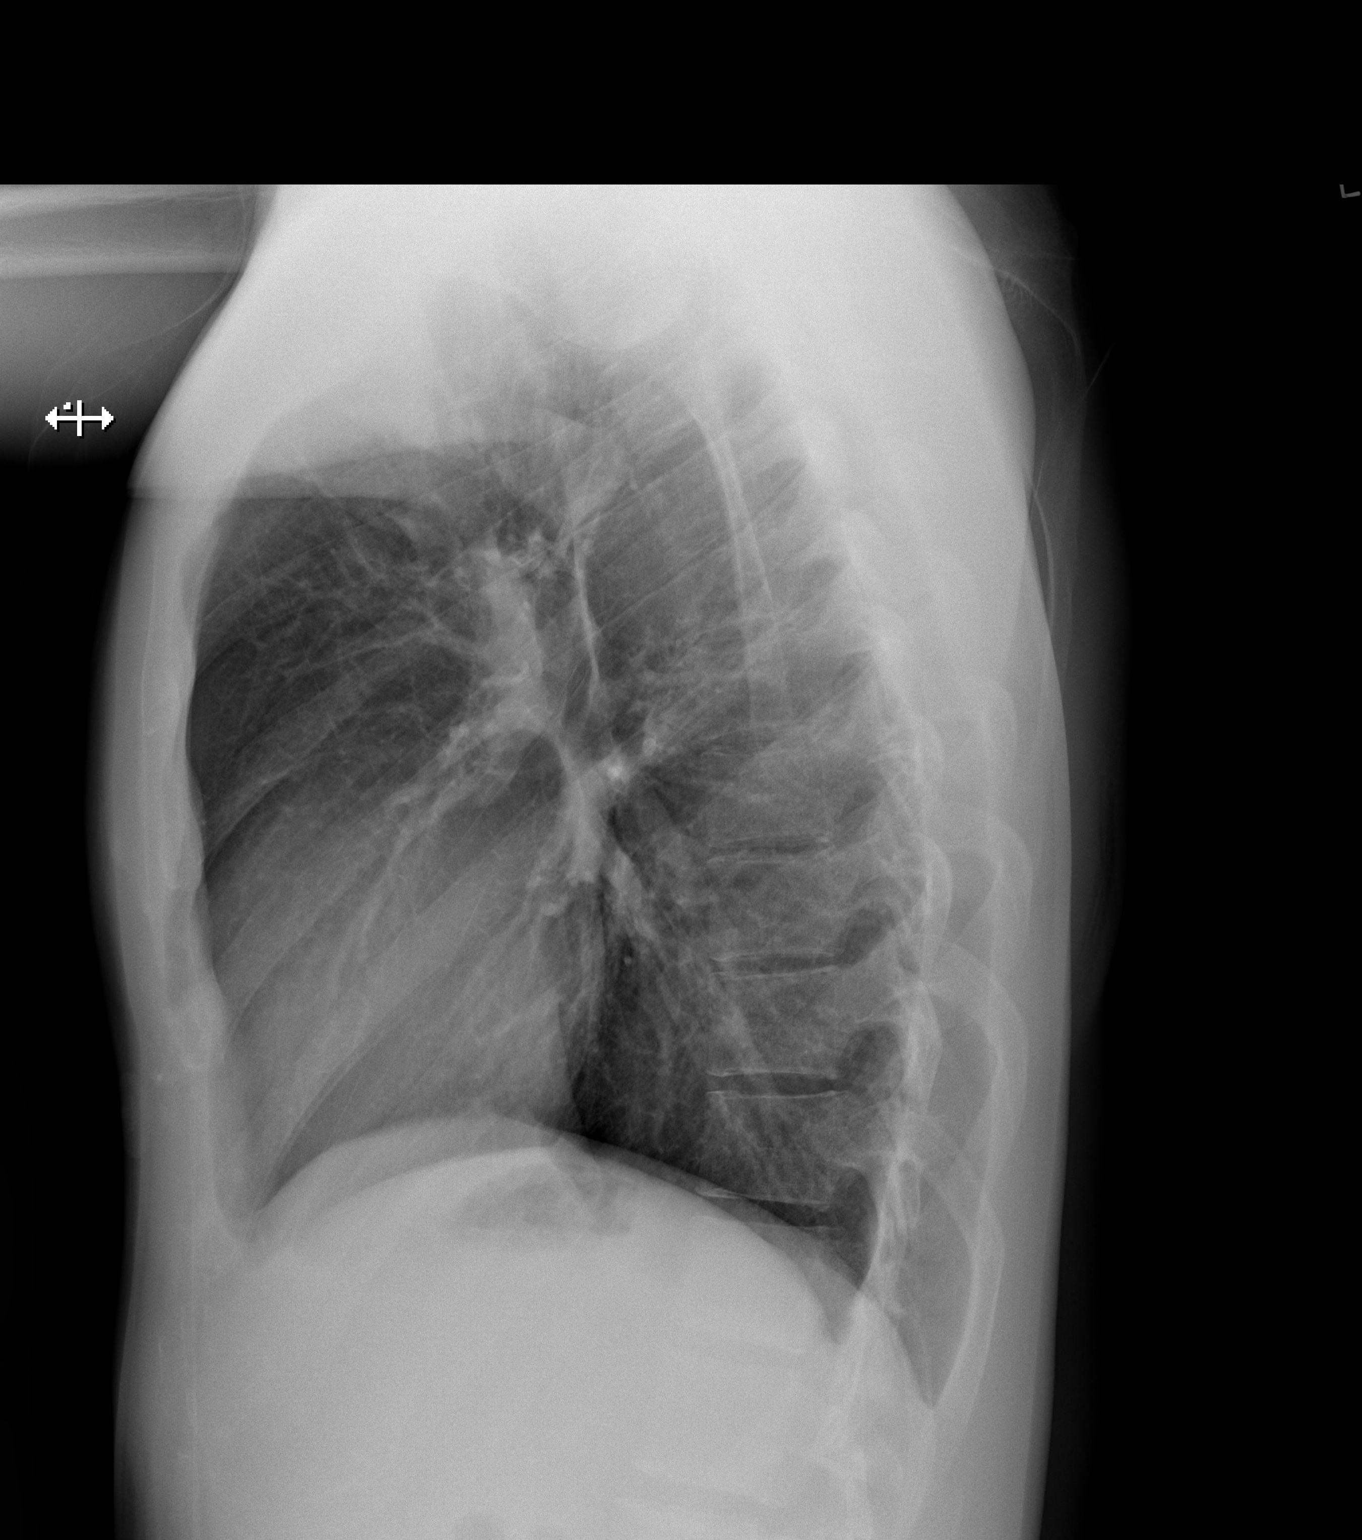

[2 of 2 positions shown; findings below may reference images not displayed]

FINDINGS: The lungs are well-aerated and clear. There is no evidence of focal
opacification, pleural effusion or pneumothorax.

The heart is normal in size; the mediastinal contour is within
normal limits. No acute osseous abnormalities are seen.
IMPRESSION: No acute cardiopulmonary process seen.

## 2017-09-29 ENCOUNTER — Emergency Department (HOSPITAL_COMMUNITY)
Admission: EM | Admit: 2017-09-29 | Discharge: 2017-09-29 | Disposition: A | Discharge status: Home / Self Care | Payer: Managed Care, Other (non HMO) | Attending: Emergency Medicine | Admitting: Emergency Medicine

## 2017-09-29 ENCOUNTER — Emergency Department (HOSPITAL_COMMUNITY): Payer: Managed Care, Other (non HMO)

## 2017-09-29 ENCOUNTER — Encounter (HOSPITAL_COMMUNITY): Payer: Self-pay

## 2017-09-29 DIAGNOSIS — R0789 Other chest pain: Secondary | ICD-10-CM | POA: Insufficient documentation

## 2017-09-29 DIAGNOSIS — J4520 Mild intermittent asthma, uncomplicated: Secondary | ICD-10-CM | POA: Insufficient documentation

## 2017-09-29 DIAGNOSIS — F17210 Nicotine dependence, cigarettes, uncomplicated: Secondary | ICD-10-CM | POA: Diagnosis not present

## 2017-09-29 DIAGNOSIS — Z79899 Other long term (current) drug therapy: Secondary | ICD-10-CM | POA: Diagnosis not present

## 2017-09-29 DIAGNOSIS — R079 Chest pain, unspecified: Secondary | ICD-10-CM | POA: Diagnosis present

## 2017-09-29 LAB — BASIC METABOLIC PANEL
Anion gap: 7 (ref 5–15)
BUN: 13 mg/dL (ref 6–20)
CHLORIDE: 106 mmol/L (ref 101–111)
CO2: 26 mmol/L (ref 22–32)
CREATININE: 1.19 mg/dL (ref 0.61–1.24)
Calcium: 9.3 mg/dL (ref 8.9–10.3)
GFR calc Af Amer: >60 mL/min (ref >60)
GFR calc non Af Amer: >60 mL/min (ref >60)
GLUCOSE: 101 mg/dL — AB (ref 65–99)
POTASSIUM: 3.7 mmol/L (ref 3.5–5.1)
Sodium: 139 mmol/L (ref 135–145)

## 2017-09-29 LAB — CBC
HCT: 41.6 % (ref 39.0–52.0)
HEMOGLOBIN: 14.3 g/dL (ref 13.0–17.0)
MCH: 30.7 pg (ref 26.0–34.0)
MCHC: 34.4 g/dL (ref 30.0–36.0)
MCV: 89.3 fL (ref 78.0–100.0)
PLATELETS: 309 10*3/uL (ref 150–400)
RBC: 4.66 MIL/uL (ref 4.22–5.81)
RDW: 12.8 % (ref 11.5–15.5)
WBC: 5.8 10*3/uL (ref 4.0–10.5)

## 2017-09-29 LAB — URINALYSIS, ROUTINE W REFLEX MICROSCOPIC
Bilirubin Urine: NEGATIVE
GLUCOSE, UA: NEGATIVE mg/dL
Hgb urine dipstick: NEGATIVE
KETONES UR: 20 mg/dL — AB
Leukocytes, UA: NEGATIVE
Nitrite: NEGATIVE
PH: 5 (ref 5.0–8.0)
Protein, ur: NEGATIVE mg/dL
SPECIFIC GRAVITY, URINE: 1.026 (ref 1.005–1.030)

## 2017-09-29 LAB — POCT I-STAT TROPONIN I: TROPONIN I, POC: 0 ng/mL (ref 0.00–0.08)

## 2017-09-29 MED ORDER — ALBUTEROL SULFATE HFA 108 (90 BASE) MCG/ACT IN AERS
1.0000 | INHALATION_SPRAY | Freq: Four times a day (QID) | RESPIRATORY_TRACT | Status: DC | PRN
  Administered 2017-09-29: 2 via RESPIRATORY_TRACT
  Filled 2017-09-29: qty 6.7

## 2017-09-29 MED ORDER — PREDNISONE 10 MG PO TABS: 40.0000 mg | ORAL_TABLET | Freq: Every day | ORAL | 0 refills | Status: DC

## 2017-09-29 MED ORDER — IBUPROFEN 600 MG PO TABS: 600.0000 mg | ORAL_TABLET | Freq: Four times a day (QID) | ORAL | 0 refills | Status: DC | PRN

## 2017-09-29 MED ORDER — IPRATROPIUM-ALBUTEROL 0.5-2.5 (3) MG/3ML IN SOLN
3.0000 mL | Freq: Once | RESPIRATORY_TRACT | Status: AC
  Administered 2017-09-29: 3 mL via RESPIRATORY_TRACT
  Filled 2017-09-29: qty 3

## 2017-09-29 MED ORDER — PREDNISONE 20 MG PO TABS
40.0000 mg | ORAL_TABLET | Freq: Once | ORAL | Status: AC
  Administered 2017-09-29: 40 mg via ORAL
  Filled 2017-09-29: qty 2

## 2017-09-29 MED ORDER — IBUPROFEN 200 MG PO TABS
600.0000 mg | ORAL_TABLET | Freq: Once | ORAL | Status: AC
  Administered 2017-09-29: 600 mg via ORAL
  Filled 2017-09-29: qty 3

## 2017-09-29 NOTE — ED Provider Notes (Signed)
[facility]-EMERGENCY DEPT Provider Note   CSN: [id number] Arrival date & time: 09/29/17  1438     History   Chief Complaint Chief Complaint  Patient presents with  . Chest Pain  . Abdominal Pain  . Shortness of Breath    HPI  Victor InfanteUrban Baldwin is a 26 y.o. male with a history of asthma who presents the emergency department today for chest pain and shortness of breath.  The patient states that over the last 2-3 days he has had intermittent episodes of tightness on the left side that occur 2-3 times per day and last 2-3 hours before going away on there own. Denies chest pressure, pleuritic chest pain, radiation to left arm, jaw or back, nausea, or diaphoresis. Pain is non-exertional and non-positional. He also reports that yesterday he started having SOB and wheezing. When he was at work today, he became very anxious and felt his chest tightness and sob come on to the point that it was difficult to talk. His symptoms have resolved after albuterol inhaler. He does not currently have any chest pain or sob. No history of cardiac disease or prior cardiac workup. No fhx of cardiac disease.  Patient is a current everyday smoker. Denies risk factors for DVT/PE including recent surgery or travel, trauma, immobilization, previous blood clot, cough, hemoptysis, cancer, lower extremity pain or swelling, or family history of bleeding/clotting disorder.  Patient also reports abdominal pain to triage. He reports to me that this is right lower back pain lower back pain. He denies any injury. Pain is positional and worse with palpation. He has not taken anything for this. No urinary symptoms.No N/V/D, hematochezia, melena. LBM today and normal. Denies history of cancer, trauma, fever, night pain, IV drug use, recent spinal manipulation or procedures, upper back pain or neck pain, numbness/tingling/weakness of the lower extremities, urinary retention, loss of bowel/bladder function, saddle  anesthesia, or unexplained weight loss.  HPI  Past Medical History:  Diagnosis Date  . Abscess   . Asthma     There are no active problems to display for this patient.   Past Surgical History:  Procedure Laterality Date  . KNEE ARTHROSCOPY         Home Medications    Prior to Admission medications   Medication Sig Start Date End Date Taking? Authorizing Provider  albuterol (PROVENTIL HFA;VENTOLIN HFA) 108 (90 Base) MCG/ACT inhaler Inhale 2 puffs into the lungs every 4 (four) hours as needed for wheezing or shortness of breath. 04/03/16  Yes Daub, Maylon PeppersSteven A, MD  Ibuprofen 200 MG CAPS Take 400 mg by mouth daily as needed (migraine).   Yes [provider]  cephALEXin (KEFLEX) 500 MG capsule Take 1 capsule (500 mg total) by mouth 2 (two) times daily. Patient not taking: Reported on 08/10/2016 06/15/16   Marlon PelGreene, Tiffany, PA-C  cyclobenzaprine (FLEXERIL) 10 MG tablet Take 1 tablet (10 mg total) by mouth 2 (two) times daily as needed for muscle spasms. Patient not taking: Reported on 09/29/2017 09/21/16   Danelle Berryapia, Leisa, PA-C  escitalopram (LEXAPRO) 10 MG tablet Take 1 tablet (10 mg total) by mouth daily. Patient not taking: Reported on 05/30/2016 04/03/16   Collene Gobbleaub, Steven A, MD  HYDROcodone-acetaminophen (NORCO/VICODIN) 5-325 MG tablet Take 2 tablets by mouth every 4 (four) hours as needed. Patient not taking: Reported on 08/10/2016 06/15/16   Marlon PelGreene, Tiffany, PA-C  ibuprofen (ADVIL,MOTRIN) 800 MG tablet Take 1 tablet (800 mg total) by mouth 3 (three) times daily. Patient not taking: Reported  on 09/29/2017 09/21/16   Danelle Berry, PA-C  predniSONE (DELTASONE) 20 MG tablet Take 2 tablets (40 mg total) by mouth daily. Patient not taking: Reported on 09/29/2017 09/21/16   Danelle Berry, PA-C    Family History History reviewed. No pertinent family history.  Social History Social History  Substance Use Topics  . Smoking status: Current Every Day Smoker    Packs/day: 0.30    Types:  Cigarettes  . Smokeless tobacco: Never Used  . Alcohol use 0.0 oz/week     Comment: at least a beer a day     Allergies   Bee venom and Shellfish allergy   Review of Systems Review of Systems  All other systems reviewed and are negative.    Physical Exam Updated Vital Signs BP (!) 137/94 (BP Location: Right Arm)   Pulse (!) 51   Temp 98.3 F (36.8 C) (Oral)   Resp 18   Ht 5\' 11"  (1.803 m)   Wt 88.5 kg (195 lb)   SpO2 100%   BMI 27.20 kg/m   Physical Exam  Constitutional: He appears well-developed and well-nourished. No distress.  Non-toxic appearing  HENT:  Head: Normocephalic and atraumatic.  Right Ear: External ear normal.  Left Ear: External ear normal.  Nose: Nose normal.  Mouth/Throat: Uvula is midline, oropharynx is clear and moist and mucous membranes are normal. No tonsillar exudate.  Eyes: Pupils are equal, round, and reactive to light. Right eye exhibits no discharge. Left eye exhibits no discharge. No scleral icterus.  Neck: Trachea normal and normal range of motion. Neck supple. No spinous process tenderness present. No neck rigidity. Normal range of motion present.  Cardiovascular: Normal rate, regular rhythm, normal heart sounds and intact distal pulses.   No murmur heard. Pulses:      Radial pulses are 2+ on the right side, and 2+ on the left side.       Femoral pulses are 2+ on the right side, and 2+ on the left side.      Dorsalis pedis pulses are 2+ on the right side, and 2+ on the left side.       Posterior tibial pulses are 2+ on the right side, and 2+ on the left side.  No lower extremity swelling or edema. No calf TTP. Calves symmetric in size bilaterally.  Pulmonary/Chest: Effort normal. No respiratory distress. He has wheezes (faint end expiratory). He exhibits no tenderness.  Abdominal: Soft. Bowel sounds are normal. He exhibits no pulsatile midline mass. There is no tenderness. There is no rigidity, no rebound, no guarding and no CVA  tenderness.  Musculoskeletal: He exhibits no edema.  Posterior and appearance appears normal. No evidence of obvious scoliosis or kyphosis. No obvious signs of skin changes, trauma, deformity, infection. No C, T, or L spine tenderness or step-offs to palpation. No C, T paraspinal tenderness. Right lumbar ttp. Lung expansion normal. Bilateral lower extremity strength 5 out of 5. Patellar and Achilles deep tendon reflex 2+ and equal bilaterally. Sensation of lower extremities grossly intact. Straight leg right neg. Straight leg left neg. Gait able but patient notes painful. Lower extremity compartments soft. PT and DP 2+ b/l. Cap refill <2 seconds.   Lymphadenopathy:    He has no cervical adenopathy.  Neurological: He is alert.  Speech clear. Follows commands. No facial droop. PERRLA. EOM grossly intact. CN III-XII grossly intact. Grossly moves all extremities 4 without ataxia. Able and appropriate strength for age to upper and lower extremities bilaterally including grip strength.  Gait normal.   Skin: Skin is warm, dry and intact. Capillary refill takes less than 2 seconds. No rash noted. He is not diaphoretic. No erythema.  Psychiatric: He has a normal mood and affect.  Nursing note and vitals reviewed.    ED Treatments / Results  Labs (all labs ordered are listed, but only abnormal results are displayed) Labs Reviewed  BASIC METABOLIC PANEL - Abnormal; Notable for the following:       Result Value   Glucose, Bld 101 (*)    All other components within normal limits  URINALYSIS, ROUTINE W REFLEX MICROSCOPIC - Abnormal; Notable for the following:    Ketones, ur 20 (*)    All other components within normal limits  CBC  I-STAT TROPONIN, ED  POCT I-STAT TROPONIN I    EKG  EKG Interpretation  Date/Time:  Thursday September 29 2017 14:47:53 EDT Ventricular Rate:  60 PR Interval:    QRS Duration: 91 QT Interval:  403 QTC Calculation: 403 R Axis:   78 Text Interpretation:  Sinus rhythm  LVH by voltage Benign early repolarization Confirmed by Crista Curb [id number]) on 09/29/2017 8:20:44 PM       Radiology Dg Chest 2 View  Result Date: 09/29/2017 CLINICAL DATA:  Chest pain for 4 days, shortness of breath today, smoking history EXAM: CHEST  2 VIEW COMPARISON:  Chest x-ray of 09/20/2016 FINDINGS: The lungs are clear. Mediastinal and hilar contours are unremarkable. The heart is within normal limits in size. No bony abnormality is seen. IMPRESSION: No active cardiopulmonary disease. Electronically Signed   By: Dwyane Dee M.D.   On: 09/29/2017 15:24    Procedures Procedures (including critical care time)  Medications Ordered in ED Medications  ibuprofen (ADVIL,MOTRIN) tablet 600 mg (600 mg Oral Given 09/29/17 2032)  ipratropium-albuterol (DUONEB) 0.5-2.5 (3) MG/3ML nebulizer solution 3 mL (3 mLs Nebulization Given 09/29/17 2007)  predniSONE (DELTASONE) tablet 40 mg (40 mg Oral Given 09/29/17 2209)     Initial Impression / Assessment and Plan / ED Course  I have reviewed the triage vital signs and the nursing notes.  Pertinent labs & imaging results that were available during my care of the patient were reviewed by me and considered in my medical decision making (see chart for details).     Patient with chest tightness and shortness of breath. Vitals are reassuring. No fever, tachycardia, tachypnea, hypoxia, or hypotension. Patient is non-toxic appearing and in no acute distress. No increase work of breathing. No current chest pain. Exam with no tracheal deviation, no JVD or new murmur, RRR. There is some end expiratory wheezing on exam. Patient notes he had relief of chest tightness and sob with albuterol. Will give duoneb in department and re-evaluate.  EKG with sinus rhythm and benign early re-pole.  Labs done in triage.  Troponin 0.00.  BMP reassuring.  No electrolyte abnormalities, no AKI.  Patient does have slightly elevated glucose but no anion gap acidosis. CBC  unremarkable. CXR unremarkable.   Patient had resolution and improvement of all symptoms improved after duoneb. Patient is to be discharged with recommendation to follow up with PCP in regards to today's hospital visit. Chest pain is not likely of cardiac etiology d/t presentation, perc negative, VSS, no tracheal deviation, no JVD or new murmur, RRR, EKG without acute abnormalities, negative troponin, and negative CXR. Will treat as asthma excerebration as patients symptoms improved after duoneb in department and inhaler at home. Will give 5 day burst of prednisone. Albuterol inhaler provided  in department. Patient instructed to follow up with PCP in regards to todays visit. Patient told to return to the ED is CP becomes exertional, associated with diaphoresis or nausea, radiates to left jaw/arm, worsens or becomes concerning in any way. Pt appears reliable for follow up and is agreeable.   Patient also reported that he had abdominal pain to triage. During my discussion with the patient the patient does not have abdominal pain but rather low back pain.  No neurological deficits and normal neuro exam.  Patient is ambulatory. There is no pulsatile mass and no abdominal TTP. Do not suspect AAA.  No loss of bowel or bladder control.  No concern for cauda equina.  No fever, night sweats, weight loss, h/o cancer, IVDU. No urinary symptoms or CVA TTP. UA without signs of infection. No blood in urine. Do not suspect kidney stone. Will advise NSAID therapy, back exercises and conservative therapy for this. Patient will also follow up with PCP for this. Patient is hemodynamically stable and appears safe for discharge.    Case has been discussed with and seen by Dr. Verdie Mosher who agrees with the above plan to discharge.    Final Clinical Impressions(s) / ED Diagnoses   Final diagnoses:  Mild intermittent asthma without complication  Atypical chest pain    New Prescriptions Discharge Medication List as of 09/29/2017   9:37 PM       Jacinto Halim, PA-C 09/30/17 1020    Lavera Guise, MD 09/30/17 [phone]

## 2017-09-29 NOTE — ED Notes (Signed)
Pt requesting an albuterol prescription however PA advises pt to follow up with PCP regarding this. Pt verbalizes understanding

## 2017-09-29 NOTE — ED Triage Notes (Signed)
Patient states he has had intermittent chest pain x 2-3 days, left chest. Patient reports that he has SOB that started yesterday and occurs even when he talks. Patient c/o not being able to take a deep breath. Patient reports intermittent lower abdominal pain that radiates into the right lower back x 2-3 months.

## 2017-09-29 NOTE — Discharge Instructions (Signed)
Read instructions below for reasons to return to the Emergency Department. It is recommended that your follow up with your Primary Care Doctor in regards to today's visit. If you do not have a doctor, use the resource guide listed below to help you find one. Begin taking over the counter Prilosec or Zegrid (omeprazole) as directed.  Also begin taking 600mg  ibuprofen every 6 hours as needed for pain.   Tests performed today include: An EKG of your heart A chest x-ray Cardiac enzymes - a blood test for heart muscle damage Blood counts and electrolytes Vital signs. See below for your results today.   Chest Pain (Nonspecific)  HOME CARE INSTRUCTIONS  For the next few days, avoid physical activities that bring on chest pain. Continue physical activities as directed.  Do not smoke cigarettes or drink alcohol until your symptoms are gone. If you do smoke, it is time to quit. You may receive instructions and counseling on how to stop smoking. Only take over-the-counter or prescription medicine for pain, discomfort, or fever as directed by your caregiver.  Follow your caregiver's suggestions for further testing if your chest pain does not go away.  Keep any follow-up appointments you made. If you do not go to an appointment, you could develop lasting (chronic) problems with pain. If there is any problem keeping an appointment, you must call to reschedule.  SEEK MEDICAL CARE IF:  You think you are having problems from the medicine you are taking. Read your medicine instructions carefully.  Your chest pain does not go away, even after treatment.  You develop a rash with blisters on your chest.  SEEK IMMEDIATE MEDICAL CARE IF:  You have increased chest pain or pain that spreads to your arm, neck, jaw, back, or belly (abdomen).  You develop shortness of breath, an increasing cough, or you are coughing up blood.  You have severe back or abdominal pain, feel sick to your stomach (nauseous) or throw up  (vomit).  You develop severe weakness, fainting, or chills.  You have an oral temperature above 102 F (38.9 C), not controlled by medicine.  THIS IS AN EMERGENCY. Do not wait to see if the pain will go away. Get medical help at once. Call your local emergency services (911 in [place] Do not drive yourself to the hospital.  You were also seen here today for symptoms related to your known Asthma.   Please follow-up with your doctor in regards to your asthma exacerbation in the next 3 days for further evaluation and management of your asthma. Read the instructions below to learn more about factors that can trigger asthma. Use your albuterol inhaler as directed. Your more than welcome to return to the emergency department if you become short of breath, your albuterol inhaler does not relieve symptoms, you are having chest pain associated with difficulty breathing, or any symptoms that are concerning to you.   Take prednisone as prescribed. Albuterol inhaler - this medication will help open up your airway. Use inhaler as follows: 1-2 puffs with spacer every 4 hours as needed for wheezing, cough, or shortness of breath.   IDENTIFY AND CONTROL FACTORS THAT MAKE YOUR ASTHMA WORSE A number of common things can set off or make your asthma symptoms worse (asthma triggers). Keep track of your asthma symptoms for several weeks, detailing all the environmental and emotional factors that are linked with your asthma. When you have an asthma attack, go back to your asthma diary to see which factor, or combination  of factors, might have contributed to it. Once you know what these factors are, you can take steps to control many of them.  Allergies: If you have allergies and asthma, it is important to take asthma prevention steps at home. Asthma attacks (worsening of asthma symptoms) can be triggered by allergies, which can cause temporary increased inflammation of your airways. Minimizing contact with the substance to  which you are allergic will help prevent an asthma attack. Animal Dander:  Some people are allergic to the flakes of skin or dried saliva from animals with fur or feathers. Keep these pets out of your home.  If you can't keep a pet outdoors, keep the pet out of your bedroom and other sleeping areas at all times, and keep the door closed.  Remove carpets and furniture covered with cloth from your home. If that is not possible, keep the pet away from fabric-covered furniture and carpets.  Dust Mites: Many people with asthma are allergic to dust mites. Dust mites are tiny bugs that are found in every home, in mattresses, pillows, carpets, fabric-covered furniture, bedcovers, clothes, stuffed toys, fabric, and other fabric-covered items.  Cover your mattress in a special dust-proof cover.  Cover your pillow in a special dust-proof cover, or wash the pillow each week in hot water. Water must be hotter than 130 F to kill dust mites. Cold or warm water used with detergent and bleach can also be effective.  Wash the sheets and blankets on your bed each week in hot water.  Try not to sleep or lie on cloth-covered cushions.  Call ahead when traveling and ask for a smoke-free hotel room. Bring your own bedding and pillows, in case the hotel only supplies feather pillows and down comforters, which may contain dust mites and cause asthma symptoms.  Remove carpets from your bedroom and those laid on concrete, if you can.  Keep stuffed toys out of the bed, or wash the toys weekly in hot water or cooler water with detergent and bleach.  Cockroaches: Many people with asthma are allergic to the droppings and remains of cockroaches.  Keep food and garbage in closed containers. Never leave food out.  Use poison baits, traps, powders, gels, or paste (for example, boric acid).  If a spray is used to kill cockroaches, stay out of the room until the odor goes away.  Indoor Mold: Fix leaky faucets, pipes, or other  sources of water that have mold around them.  Clean moldy surfaces with a cleaner that has bleach in it.  Pollen and Outdoor Mold: When pollen or mold spore counts are high, try to keep your windows closed.  Stay indoors with windows closed from late morning to afternoon, if you can. Pollen and some mold spore counts are highest at that time.  Ask your caregiver whether you need to take or increase anti-inflammatory medicine before your allergy season starts.  Irritants:  Tobacco smoke is an irritant. If you smoke, ask your caregiver how you can quit. Ask family members to quit smoking, too. Do not allow smoking in your home or car.  If possible, do not use a wood-burning stove, kerosene heater, or fireplace. Minimize exposure to all sources of smoke, including incense, candles, fires, and fireworks.  Try to stay away from strong odors and sprays, such as perfume, talcum powder, hair spray, and paints.  Decrease humidity in your home and use an indoor air cleaning device. Reduce indoor humidity to below 60 percent. Dehumidifiers or central air  conditioners can do this.  Try to have someone else vacuum for you once or twice a week, if you can. Stay out of rooms while they are being vacuumed and for a short while afterward.  If you vacuum, use a dust mask from a hardware store, a double-layered or microfilter vacuum cleaner bag, or a vacuum cleaner with a HEPA filter.  Sulfites in foods and beverages can be irritants. Do not drink beer or wine, or eat dried fruit, processed potatoes, or shrimp if they cause asthma symptoms.  Cold air can trigger an asthma attack. Cover your nose and mouth with a scarf on cold or windy days.  Several health conditions can make asthma more difficult to manage, including runny nose, sinus infections, reflux disease, psychological stress, and sleep apnea. Your caregiver will treat these conditions, as well.  Avoid close contact with people who have a cold or the flu, since  your asthma symptoms may get worse if you catch the infection from them. Wash your hands thoroughly after touching items that may have been handled by people with a respiratory infection.  Get a flu shot every year to protect against the flu virus, which often makes asthma worse for days or weeks. Also get a pneumonia shot once every five to 10 years.  Drugs: Aspirin and other painkillers can cause asthma attacks. 10% to 20% of people with asthma have sensitivity to aspirin or a group of painkillers called non-steroidal anti-inflammatory drugs (NSAIDS), such as ibuprofen and naproxen. These drugs are used to treat pain and reduce fevers. Asthma attacks caused by any of these medicines can be severe and even fatal. These drugs must be avoided in people who have known aspirin sensitive asthma. Products with acetaminophen are considered safe for people who have asthma. It is important that people with aspirin sensitivity read labels of all over-the-counter drugs used to treat pain, colds, coughs, and fever.  Beta blockers and ACE inhibitors are other drugs which you should discuss with your caregiver, in relation to your asthma.   EXERCISE  If you have exercise-induced asthma, or are planning vigorous exercise, or exercise in cold, humid, or dry environments, prevent exercise-induced asthma by following your caregiver's advice regarding asthma treatment before exercising.  Additional Information:  Your vital signs today were: BP (!) 137/94 (BP Location: Right Arm)    Pulse (!) 51    Temp 98.3 F (36.8 C) (Oral)    Resp 18    Ht 5\' 11"  (1.803 m)    Wt 88.5 kg (195 lb)    SpO2 100%    BMI 27.20 kg/m  If your blood pressure (BP) was elevated above 135/85 this visit, please have this repeated by your doctor within one month. ---------------

## 2017-09-30 ENCOUNTER — Ambulatory Visit (INDEPENDENT_AMBULATORY_CARE_PROVIDER_SITE_OTHER): Payer: Managed Care, Other (non HMO) | Admitting: Physician Assistant

## 2017-09-30 ENCOUNTER — Encounter: Payer: Self-pay | Admitting: Physician Assistant

## 2017-09-30 VITALS — BP 140/70 | HR 68 | Temp 97.5°F | Resp 16 | Ht 72.0 in | Wt 193.4 lb

## 2017-09-30 DIAGNOSIS — J45909 Unspecified asthma, uncomplicated: Secondary | ICD-10-CM | POA: Diagnosis not present

## 2017-09-30 DIAGNOSIS — R0602 Shortness of breath: Secondary | ICD-10-CM

## 2017-09-30 MED ORDER — ALBUTEROL SULFATE (2.5 MG/3ML) 0.083% IN NEBU
5.0000 mg | INHALATION_SOLUTION | Freq: Once | RESPIRATORY_TRACT | Status: AC
  Administered 2017-09-30: 5 mg via RESPIRATORY_TRACT

## 2017-09-30 MED ORDER — IPRATROPIUM BROMIDE 0.02 % IN SOLN
0.5000 mg | Freq: Once | RESPIRATORY_TRACT | Status: AC
  Administered 2017-09-30: 0.5 mg via RESPIRATORY_TRACT

## 2017-09-30 MED ORDER — METHYLPREDNISOLONE SODIUM SUCC 125 MG IJ SOLR
125.0000 mg | Freq: Once | INTRAMUSCULAR | Status: AC
  Administered 2017-09-30: 125 mg via INTRAMUSCULAR

## 2017-09-30 NOTE — Progress Notes (Signed)
Victor Burns  MRN: [id number] DOB: 05/15/1991  PCP: Patient, No Pcp Per  Subjective:  Pt is a 26 year old male who presents to clinic for shob.   He was at the emergency department yesterday for the same c/c after 3 days of several episodes of shob relieved with his albuterol inhaler. Endorses episodes of anxiety contributing to his flares. In the ED he was treated with a duoneb and prescribed prednisone 40mg .  He has not had the chance yet to fill the prednisone.   H/o asthma - Takes Proventil PRN. His Rx had expired a while ago and he was using this the past 3 days.  Prior to this recent episode his last asthma attack was 2016. He is not on daily dose of asthma medication.   Review of Systems  Constitutional: Negative for chills, diaphoresis, fatigue and fever.  Respiratory: Positive for chest tightness, shortness of breath and wheezing.   Cardiovascular: Negative.   Psychiatric/Behavioral: The patient is nervous/anxious.     There are no active problems to display for this patient.   Current Outpatient Prescriptions on File Prior to Visit  Medication Sig Dispense Refill  . albuterol (PROVENTIL HFA;VENTOLIN HFA) 108 (90 Base) MCG/ACT inhaler Inhale 2 puffs into the lungs every 4 (four) hours as needed for wheezing or shortness of breath. 1 Inhaler 4  . ibuprofen (ADVIL,MOTRIN) 600 MG tablet Take 1 tablet (600 mg total) by mouth every 6 (six) hours as needed. 30 tablet 0  . predniSONE (DELTASONE) 10 MG tablet Take 4 tablets (40 mg total) by mouth daily. 16 tablet 0  . cyclobenzaprine (FLEXERIL) 10 MG tablet Take 1 tablet (10 mg total) by mouth 2 (two) times daily as needed for muscle spasms. (Patient not taking: Reported on 09/29/2017) 20 tablet 0  . escitalopram (LEXAPRO) 10 MG tablet Take 1 tablet (10 mg total) by mouth daily. (Patient not taking: Reported on 05/30/2016) 30 tablet 11  . HYDROcodone-acetaminophen (NORCO/VICODIN) 5-325 MG tablet Take 2 tablets by mouth every 4  (four) hours as needed. (Patient not taking: Reported on 08/10/2016) 6 tablet 0   No current facility-administered medications on file prior to visit.     Allergies  Allergen Reactions  . Bee Venom Anaphylaxis  . Shellfish Allergy Anaphylaxis     Objective:  BP 140/70   Pulse 68   Temp (!) 97.5 F (36.4 C) (Oral)   Resp 16   Ht 6' (1.829 m)   Wt 193 lb 6.4 oz (87.7 kg)   SpO2 100%   BMI 26.23 kg/m   Physical Exam  Constitutional: He is oriented to person, place, and time and well-developed, well-nourished, and in no distress. No distress.  Cardiovascular: Normal rate, regular rhythm and normal heart sounds.   Pulmonary/Chest: Effort normal. He has wheezes in the right upper field, the right middle field, the left upper field and the left middle field. He has no rhonchi. He has no rales.  Neurological: He is alert and oriented to person, place, and time. GCS score is 15.  Skin: Skin is warm and dry.  Psychiatric: Memory, affect and judgment normal. His mood appears anxious.  Vitals reviewed.   Assessment and Plan :  1. Shortness of breath 2. Asthma, unspecified asthma severity, unspecified whether complicated, unspecified whether persistent - Spirometry: Peak - methylPREDNISolone sodium succinate (SOLU-MEDROL) 125 mg/2 mL injection 125 mg; Inject 2 mLs (125 mg total) into the muscle once. - albuterol (PROVENTIL) (2.5 MG/3ML) 0.083% nebulizer solution 5 mg; Take 6  mLs (5 mg total) by nebulization once. - ipratropium (ATROVENT) nebulizer solution 0.5 mg; Take 2.5 mLs (0.5 mg total) by nebulization once. - Pt reports improvement following solumedrol and breathing treatment. Impressed upon him the importance of filling his prednisone Rx at his pharmacy. Suspect anxiety as a component of asthma trigger. Emergency department conditions discussed. F/u in 1 week to discuss asthma management.   Marco CollieWhitney Elijio Staples, PA-C  Primary Care at [facility] 09/30/2017 5:09  PM

## 2017-09-30 NOTE — Patient Instructions (Addendum)
FILL YOUR PREDNISONE PRESCRIPTION!!!! Start this tomorrow morning with breakfast.  Come back if you need another breathing treatment. We are closed Sunday.   Come back and see me next week to discuss long term medication management.   Thank you for coming in today. I hope you feel we met your needs.  Feel free to call PCP if you have any questions or further requests.  Please consider signing up for MyChart if you do not already have it, as this is a great way to communicate with me.  Best,  Whitney McVey, PA-C   IF you received an x-ray today, you will receive an invoice from [facility]. Please contact [facility] at [phone] with questions or concerns regarding your invoice.   IF you received labwork today, you will receive an invoice from [facility]. Please contact [facility] at [phone] with questions or concerns regarding your invoice.   Our billing staff will not be able to assist you with questions regarding bills from these companies.  You will be contacted with the lab results as soon as they are available. The fastest way to get your results is to activate your My Chart account. Instructions are located on the last page of this paperwork. If you have not heard from [place] regarding the results in 2 weeks, please contact this office.

## 2017-10-06 ENCOUNTER — Ambulatory Visit: Payer: Managed Care, Other (non HMO) | Admitting: Physician Assistant

## 2017-10-07 ENCOUNTER — Ambulatory Visit (INDEPENDENT_AMBULATORY_CARE_PROVIDER_SITE_OTHER): Payer: Managed Care, Other (non HMO) | Admitting: Physician Assistant

## 2017-10-07 ENCOUNTER — Encounter: Payer: Self-pay | Admitting: Physician Assistant

## 2017-10-07 VITALS — BP 128/76 | HR 71 | Temp 98.1°F | Resp 16 | Ht 72.0 in | Wt 192.2 lb

## 2017-10-07 DIAGNOSIS — J4530 Mild persistent asthma, uncomplicated: Secondary | ICD-10-CM | POA: Diagnosis not present

## 2017-10-07 MED ORDER — MOMETASONE FUROATE 220 MCG/INH IN AEPB: 2.0000 | INHALATION_SPRAY | Freq: Every day | RESPIRATORY_TRACT | 12 refills | Status: DC

## 2017-10-07 NOTE — Progress Notes (Signed)
Victor Burns  MRN: [id number] DOB: [birth year]  PCP: Patient, No Pcp Per  Subjective:  Pt is a pleasant 26 year old male who presents to clinic for f/u shob.  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   He was here 10/26 after a visit to the ED on 10/25 for shob. He did not fill Rx for prednisone from ED and had another shob episode and presented to PCP   OV 10/26 he was treated with IM solumedrol and a duoneb.  He was taking Prednisone 10mg /day - the Rx was for 40mg  however he was scared to take that many pills.  He is feeling 100% better, however endorses a few episodes of shob made better with cold water and albuterol.    He has never been on daily asthma medication. Uses albuterol only.  Feels shob 3-4 times a week. Made better with cold water and burping.  Never wakes up at night.  Uses inhaler 1-2 times/week.  Episodes are brought on by talking, or not talking, and long walks.  Asthma exacerbations leads to migraines - he gets these 3 times week.   Review of Systems  Respiratory: Positive for chest tightness, shortness of breath and wheezing. Negative for apnea and cough.   Cardiovascular: Negative for chest pain and palpitations.  Neurological: Positive for headaches. Negative for dizziness.    There are no active problems to display for this patient.   Current Outpatient Prescriptions on File Prior to Visit  Medication Sig Dispense Refill  . albuterol (PROVENTIL HFA;VENTOLIN HFA) 108 (90 Base) MCG/ACT inhaler Inhale 2 puffs into the lungs every 4 (four) hours as needed for wheezing or shortness of breath. 1 Inhaler 4  . ibuprofen (ADVIL,MOTRIN) 600 MG tablet Take 1 tablet (600 mg total) by mouth every 6 (six) hours as needed. 30 tablet 0  . predniSONE (DELTASONE) 10 MG tablet Take 4 tablets (40 mg total) by mouth daily. 16 tablet 0   No current facility-administered  medications on file prior to visit.     Allergies  Allergen Reactions  . Bee Venom Anaphylaxis  . Shellfish Allergy Anaphylaxis     Objective:  BP 128/76   Pulse 71   Temp 98.1 F (36.7 C) (Oral)   Resp 16   Ht 6' (1.829 m)   Wt 192 lb 3.2 oz (87.2 kg)   SpO2 99%   BMI 26.07 kg/m   Physical Exam  Constitutional: He is oriented to person, place, and time and well-developed, well-nourished, and in no distress. No distress.  Pulmonary/Chest: Effort normal and breath sounds normal. No respiratory distress. He has no wheezes.  Neurological: He is alert and oriented to person, place, and time. GCS score is 15.  Skin: Skin is warm and dry.  Psychiatric: Mood, memory, affect and judgment normal.  Vitals reviewed.   Assessment and Plan :  1. Mild persistent asthma, unspecified whether complicated - mometasone (ASMANEX) 220 MCG/INH inhaler; Inhale 2 puffs into the lungs daily.  Dispense: 1 Inhaler; Refill: 12 - Pt presents for f/u shob. He was treated 1 week ago for acute shob episode and was asked to f/u today to discuss daily asthma treatment. Plan to start Asmanex qd. RTC in 4-6 weeks for f/u. Discussed with pt importance of not using Albuterol often due to tolerance issues. He verbalizes understanding.   Marco Collie, PA-C  Primary Care at  [facility] 10/07/2017 12:04 PM

## 2017-10-07 NOTE — Patient Instructions (Addendum)
Start using Asmanex (mometasone) 2 puffs once daily.  Use Albuterol ONLY when you are having shortness of breath. I am hoping that by starting Asmanex now, you will need to use Albuterol way less often than you do now. Come back and see me in 4-6 weeks!!!! I want to make sure this is working well for you.   Oral candidiasis (thrush) and dysphonia (hoarse voice) are the most common side effects of low-dose inhaled glucocorticoids.   Thank you for coming in today. I hope you feel we met your needs.  Feel free to call PCP if you have any questions or further requests.  Please consider signing up for MyChart if you do not already have it, as this is a great way to communicate with me.  Best,  Whitney McVey, PA-C  IF you received an x-ray today, you will receive an invoice from [facility]. Please contact [facility] at [phone] with questions or concerns regarding your invoice.   IF you received labwork today, you will receive an invoice from [facility]. Please contact [facility] at [phone] with questions or concerns regarding your invoice.   Our billing staff will not be able to assist you with questions regarding bills from these companies.  You will be contacted with the lab results as soon as they are available. The fastest way to get your results is to activate your My Chart account. Instructions are located on the last page of this paperwork. If you have not heard from [place] regarding the results in 2 weeks, please contact this office.

## 2017-11-09 ENCOUNTER — Ambulatory Visit: Payer: Managed Care, Other (non HMO) | Admitting: Physician Assistant

## 2018-01-31 ENCOUNTER — Other Ambulatory Visit: Payer: Self-pay

## 2018-01-31 ENCOUNTER — Encounter (HOSPITAL_COMMUNITY): Payer: Self-pay | Admitting: *Deleted

## 2018-01-31 ENCOUNTER — Emergency Department (HOSPITAL_COMMUNITY)
Admission: EM | Admit: 2018-01-31 | Discharge: 2018-01-31 | Disposition: A | Discharge status: Home / Self Care | Payer: Managed Care, Other (non HMO) | Attending: Emergency Medicine | Admitting: Emergency Medicine

## 2018-01-31 ENCOUNTER — Emergency Department (HOSPITAL_COMMUNITY): Payer: Managed Care, Other (non HMO)

## 2018-01-31 DIAGNOSIS — K611 Rectal abscess: Secondary | ICD-10-CM | POA: Diagnosis not present

## 2018-01-31 DIAGNOSIS — F17210 Nicotine dependence, cigarettes, uncomplicated: Secondary | ICD-10-CM | POA: Insufficient documentation

## 2018-01-31 DIAGNOSIS — K6289 Other specified diseases of anus and rectum: Secondary | ICD-10-CM | POA: Diagnosis present

## 2018-01-31 DIAGNOSIS — J45909 Unspecified asthma, uncomplicated: Secondary | ICD-10-CM | POA: Diagnosis not present

## 2018-01-31 LAB — CBC WITH DIFFERENTIAL/PLATELET
Basophils Absolute: 0 10*3/uL (ref 0.0–0.1)
Basophils Relative: 0 %
EOS ABS: 0 10*3/uL (ref 0.0–0.7)
EOS PCT: 0 %
HCT: 36.6 % — ABNORMAL LOW (ref 39.0–52.0)
HEMOGLOBIN: 12.7 g/dL — AB (ref 13.0–17.0)
LYMPHS ABS: 1.5 10*3/uL (ref 0.7–4.0)
LYMPHS PCT: 20 %
MCH: 30.8 pg (ref 26.0–34.0)
MCHC: 34.7 g/dL (ref 30.0–36.0)
MCV: 88.6 fL (ref 78.0–100.0)
MONOS PCT: 10 %
Monocytes Absolute: 0.8 10*3/uL (ref 0.1–1.0)
Neutro Abs: 5.3 10*3/uL (ref 1.7–7.7)
Neutrophils Relative %: 70 %
PLATELETS: 277 10*3/uL (ref 150–400)
RBC: 4.13 MIL/uL — AB (ref 4.22–5.81)
RDW: 12.5 % (ref 11.5–15.5)
WBC: 7.7 10*3/uL (ref 4.0–10.5)

## 2018-01-31 LAB — BASIC METABOLIC PANEL
Anion gap: 8 (ref 5–15)
BUN: 14 mg/dL (ref 6–20)
CHLORIDE: 106 mmol/L (ref 101–111)
CO2: 25 mmol/L (ref 22–32)
CREATININE: 1.17 mg/dL (ref 0.61–1.24)
Calcium: 9.1 mg/dL (ref 8.9–10.3)
GFR calc Af Amer: >60 mL/min (ref >60)
GFR calc non Af Amer: >60 mL/min (ref >60)
GLUCOSE: 90 mg/dL (ref 65–99)
POTASSIUM: 4 mmol/L (ref 3.5–5.1)
Sodium: 139 mmol/L (ref 135–145)

## 2018-01-31 MED ORDER — ONDANSETRON HCL 4 MG/2ML IJ SOLN
4.0000 mg | Freq: Once | INTRAMUSCULAR | Status: AC
  Administered 2018-01-31: 4 mg via INTRAVENOUS
  Filled 2018-01-31: qty 2

## 2018-01-31 MED ORDER — AMOXICILLIN-POT CLAVULANATE 875-125 MG PO TABS
1.0000 | ORAL_TABLET | Freq: Once | ORAL | Status: AC
Start: 2018-01-31 — End: 2018-01-31
  Administered 2018-01-31: 1 via ORAL
  Filled 2018-01-31: qty 1

## 2018-01-31 MED ORDER — IOPAMIDOL (ISOVUE-300) INJECTION 61%
100.0000 mL | Freq: Once | INTRAVENOUS | Status: AC | PRN
  Administered 2018-01-31: 100 mL via INTRAVENOUS

## 2018-01-31 MED ORDER — IOPAMIDOL (ISOVUE-300) INJECTION 61%
INTRAVENOUS | Status: AC
  Filled 2018-01-31: qty 100

## 2018-01-31 MED ORDER — SODIUM CHLORIDE 0.9 % IJ SOLN
INTRAMUSCULAR | Status: AC
  Filled 2018-01-31: qty 50

## 2018-01-31 MED ORDER — HYDROMORPHONE HCL 1 MG/ML IJ SOLN
1.0000 mg | Freq: Once | INTRAMUSCULAR | Status: AC
  Administered 2018-01-31: 1 mg via INTRAVENOUS
  Filled 2018-01-31: qty 1

## 2018-01-31 MED ORDER — OXYCODONE-ACETAMINOPHEN 5-325 MG PO TABS
1.0000 | ORAL_TABLET | Freq: Once | ORAL | Status: AC
  Administered 2018-01-31: 1 via ORAL
  Filled 2018-01-31: qty 1

## 2018-01-31 MED ORDER — AMOXICILLIN-POT CLAVULANATE 875-125 MG PO TABS
1.0000 | ORAL_TABLET | Freq: Two times a day (BID) | ORAL | 0 refills | Status: DC
Start: 2018-01-31 — End: 2018-05-16

## 2018-01-31 MED ORDER — SODIUM CHLORIDE 0.9 % IV SOLN
INTRAVENOUS | Status: DC
  Administered 2018-01-31: 20:00:00 via INTRAVENOUS

## 2018-01-31 MED ORDER — OXYCODONE-ACETAMINOPHEN 5-325 MG PO TABS: 1.0000 | ORAL_TABLET | ORAL | 0 refills | Status: DC | PRN

## 2018-01-31 NOTE — Discharge Instructions (Signed)
I spoke with Dr. Magnus IvanBlackman, general surgery, and he wants you to call the office first thing in the morning and tell them you need to be seen in the Urgent Clinic there in the office for incision and drainage of your perirectal abscess.  Take your pain medication and antibiotic and sit in warm tubs of water.

## 2018-01-31 NOTE — ED Provider Notes (Signed)
[facility]-EMERGENCY DEPT Provider Note   CSN: [id number] Arrival date & time: 01/31/18  1756     History   Chief Complaint Chief Complaint  Patient presents with  . Abscess    HPI Victor Burns is a 27 y.o. male who presents to the ED with rectal pain. Patient reports having difficulty with BM's due to pain. Patient reports that the pain started 3 days ago and has gotten worse. Pain is more on the left side and the rectal area. Patient reports he had similar problem a few years ago but not as bad. Patient reports he has not taken pain medication.   HPI  Past Medical History:  Diagnosis Date  . Abscess   . Asthma     Patient Active Problem List   Diagnosis Date Noted  . Mild persistent asthma 10/07/2017    Past Surgical History:  Procedure Laterality Date  . KNEE ARTHROSCOPY         Home Medications    Prior to Admission medications   Medication Sig Start Date End Date Taking? Authorizing Provider  albuterol (PROVENTIL HFA;VENTOLIN HFA) 108 (90 Base) MCG/ACT inhaler Inhale 2 puffs into the lungs every 4 (four) hours as needed for wheezing or shortness of breath. 04/03/16   Collene Gobbleaub, Steven A, MD  amoxicillin-clavulanate (AUGMENTIN) 875-125 MG tablet Take 1 tablet by mouth every 12 (twelve) hours. 01/31/18   Janne NapoleonNeese, Hope M, NP  ibuprofen (ADVIL,MOTRIN) 600 MG tablet Take 1 tablet (600 mg total) by mouth every 6 (six) hours as needed. 09/29/17   Maczis, Elmer SowMichael M, PA-C  mometasone [facility](ASMANEX) 220 MCG/INH inhaler Inhale 2 puffs into the lungs daily. 10/07/17   McVey, Madelaine BhatElizabeth Whitney, PA-C  oxyCODONE-acetaminophen (PERCOCET) 5-325 MG tablet Take 1 tablet by mouth every 4 (four) hours as needed for severe pain. 01/31/18   Janne NapoleonNeese, Hope M, NP  predniSONE (DELTASONE) 10 MG tablet Take 4 tablets (40 mg total) by mouth daily. 09/29/17   Maczis, Elmer SowMichael M, PA-C    Family History No family history on file.  Social History Social History   Tobacco Use  .  Smoking status: Current Every Day Smoker    Packs/day: 0.30    Types: Cigarettes  . Smokeless tobacco: Never Used  Substance Use Topics  . Alcohol use: Yes    Alcohol/week: 0.0 oz    Comment: at least a beer a day  . Drug use: No     Allergies   Bee venom and Shellfish allergy   Review of Systems Review of Systems  Constitutional: Negative for chills and fever.  Gastrointestinal: Positive for rectal pain.  Skin:       abscess  All other systems reviewed and are negative.    Physical Exam Updated Vital Signs BP 115/62 (BP Location: Left Arm)   Pulse 86   Temp 98.9 F (37.2 C) (Oral)   Resp 16   Ht 5\' 11"  (1.803 m)   Wt 88.5 kg (195 lb)   SpO2 100%   BMI 27.20 kg/m   Physical Exam  Constitutional: He appears well-developed and well-nourished. No distress.  HENT:  Head: Normocephalic.  Eyes: EOM are normal.  Neck: Neck supple.  Cardiovascular: Normal rate.  Pulmonary/Chest: Effort normal.  Genitourinary: Rectal exam shows tenderness.     Genitourinary Comments: Anal area and area to the left of the anus tender raised. Patient unable to tolerate rectal exam due to pain.   Musculoskeletal: Normal range of motion.  Neurological: He is alert.  Skin:  Skin is warm and dry.  Psychiatric: He has a normal mood and affect.  Nursing note and vitals reviewed.    ED Treatments / Results  Labs (all labs ordered are listed, but only abnormal results are displayed) Labs Reviewed  CBC WITH DIFFERENTIAL/PLATELET - Abnormal; Notable for the following components:      Result Value   RBC 4.13 (*)    Hemoglobin 12.7 (*)    HCT 36.6 (*)    All other components within normal limits  BASIC METABOLIC PANEL   Radiology Ct Pelvis W Contrast  Result Date: 01/31/2018 CLINICAL DATA:  27 year old male with abscesses in the left buttock and perirectal region. EXAM: CT PELVIS WITH CONTRAST TECHNIQUE: Multidetector CT imaging of the pelvis was performed using the standard  protocol following the bolus administration of intravenous contrast. CONTRAST:  ISOVUE-300 IOPAMIDOL (ISOVUE-300) INJECTION 61%, <See Chart> ISOVUE-300 IOPAMIDOL (ISOVUE-300) INJECTION 61% COMPARISON:  CT of the pelvis dated 01/03/2015 FINDINGS: Urinary Tract: The visualized distal ureters and urinary bladder appear unremarkable. Bowel: No bowel dilatation or active inflammation within the pelvis. The appendix is normal. Vascular/Lymphatic: No pathologically enlarged lymph nodes. No significant vascular abnormality seen. Reproductive: The prostate and seminal vesicles are grossly unremarkable. No pelvic mass. Other: Small fat containing umbilical hernia. There is a 4.5 x 2.7 cm fluid collection with surrounding induration and inflammatory changes in the subcutaneous soft tissues of the inferior medial left gluteal region along the intergluteal cleft most consistent with a phlegmon or abscess. Ultrasound may provide better evaluation. Musculoskeletal: No suspicious bone lesions identified. IMPRESSION: Left gluteal/perirectal phlegmon/abscess. Electronically Signed   By: Elgie Collard M.D.   On: 01/31/2018 21:32    Procedures Procedures (including critical care time)  Medications Ordered in ED Medications  0.9 %  sodium chloride infusion ( Intravenous Stopped 01/31/18 2216)  iopamidol (ISOVUE-300) 61 % injection (  Canceled Entry 01/31/18 2115)  sodium chloride 0.9 % injection (not administered)  HYDROmorphone (DILAUDID) injection 1 mg (1 mg Intravenous Given 01/31/18 2017)  ondansetron (ZOFRAN) injection 4 mg (4 mg Intravenous Given 01/31/18 2017)  iopamidol (ISOVUE-300) 61 % injection 100 mL (100 mLs Intravenous Contrast Given 01/31/18 2110)  oxyCODONE-acetaminophen (PERCOCET/ROXICET) 5-325 MG per tablet 1 tablet (1 tablet Oral Given 01/31/18 2151)  amoxicillin-clavulanate (AUGMENTIN) 875-125 MG per tablet 1 tablet (1 tablet Oral Given 01/31/18 2204)   Consult with Dr. Magnus Ivan, general surgery,  patient to call the office in the morning and plan to come to the urgent walk in for I&D of the abscess.   Initial Impression / Assessment and Plan / ED Course  I have reviewed the triage vital signs and the nursing notes. 27 y.o. male with rectal pain and CT that shows a perirectal abscess stable for d/c with pain management and antibiotics and f/u with general surgeon in the morning. Discussed with the patient plan of care and he agrees.  Pertinent labs & imaging results that were available during my care of the patient were reviewed by me and considered in my medical decision making.   Final Clinical Impressions(s) / ED Diagnoses   Final diagnoses:  Perirectal abscess    ED Discharge Orders        Ordered    oxyCODONE-acetaminophen (PERCOCET) 5-325 MG tablet  Every 4 hours PRN     01/31/18 2156    amoxicillin-clavulanate (AUGMENTIN) 875-125 MG tablet  Every 12 hours     01/31/18 2156       Kerrie Buffalo [place], [place] 01/31/18 2255  Alvira Monday, MD 02/02/18 [phone]

## 2018-01-31 NOTE — ED Notes (Signed)
Pt and partner understand discharge instructions and to call Dr Vevelyn RoyalsBlackmans office in the am to be seen

## 2018-01-31 NOTE — ED Triage Notes (Signed)
Pt states he has a ? Boil/abscess near rectum left buttocks. Noted tenderness without redness or formed head.

## 2018-05-16 ENCOUNTER — Other Ambulatory Visit: Payer: Self-pay

## 2018-05-16 ENCOUNTER — Ambulatory Visit: Payer: Managed Care, Other (non HMO) | Admitting: Physician Assistant

## 2018-05-16 ENCOUNTER — Encounter: Payer: Self-pay | Admitting: Physician Assistant

## 2018-05-16 ENCOUNTER — Ambulatory Visit (INDEPENDENT_AMBULATORY_CARE_PROVIDER_SITE_OTHER): Payer: Managed Care, Other (non HMO)

## 2018-05-16 VITALS — BP 130/82 | HR 60 | Temp 98.1°F | Resp 20 | Ht 71.81 in | Wt 195.0 lb

## 2018-05-16 DIAGNOSIS — R42 Dizziness and giddiness: Secondary | ICD-10-CM

## 2018-05-16 DIAGNOSIS — K5900 Constipation, unspecified: Secondary | ICD-10-CM | POA: Diagnosis not present

## 2018-05-16 DIAGNOSIS — R519 Headache, unspecified: Secondary | ICD-10-CM

## 2018-05-16 DIAGNOSIS — J45909 Unspecified asthma, uncomplicated: Secondary | ICD-10-CM

## 2018-05-16 DIAGNOSIS — R9431 Abnormal electrocardiogram [ECG] [EKG]: Secondary | ICD-10-CM | POA: Diagnosis not present

## 2018-05-16 DIAGNOSIS — R079 Chest pain, unspecified: Secondary | ICD-10-CM

## 2018-05-16 DIAGNOSIS — R51 Headache: Secondary | ICD-10-CM

## 2018-05-16 DIAGNOSIS — R1032 Left lower quadrant pain: Secondary | ICD-10-CM

## 2018-05-16 LAB — POCT URINALYSIS DIP (MANUAL ENTRY)
Bilirubin, UA: NEGATIVE
Blood, UA: NEGATIVE
Glucose, UA: NEGATIVE mg/dL
Ketones, POC UA: NEGATIVE mg/dL
Leukocytes, UA: NEGATIVE
Nitrite, UA: NEGATIVE
Protein Ur, POC: NEGATIVE mg/dL
Spec Grav, UA: 1.025 (ref 1.010–1.025)
Urobilinogen, UA: 0.2 U/dL
pH, UA: 6 (ref 5.0–8.0)

## 2018-05-16 LAB — POCT CBC
Granulocyte percent: 53.4 % (ref 37–80)
HCT, POC: 44.2 % (ref 43.5–53.7)
Hemoglobin: 14.4 g/dL (ref 14.1–18.1)
Lymph, poc: 1.6 (ref 0.6–3.4)
MCH, POC: 29.3 pg (ref 27–31.2)
MCHC: 32.6 g/dL (ref 31.8–35.4)
MCV: 89.9 fL (ref 80–97)
MID (cbc): 0.1 (ref 0–0.9)
MPV: 6.7 fL (ref 0–99.8)
POC Granulocyte: 1.9 — AB (ref 2–6.9)
POC LYMPH PERCENT: 43.1 %L (ref 10–50)
POC MID %: 3.5 % (ref 0–12)
Platelet Count, POC: 316 10*3/uL (ref 142–424)
RBC: 4.92 M/uL (ref 4.69–6.13)
RDW, POC: 13.4 %
WBC: 3.6 10*3/uL — AB (ref 4.6–10.2)

## 2018-05-16 LAB — CMP14+EGFR
ALT: 17 IU/L (ref 0–44)
AST: 21 IU/L (ref 0–40)
Albumin/Globulin Ratio: 1.6 (ref 1.2–2.2)
Albumin: 4.8 g/dL (ref 3.5–5.5)
Alkaline Phosphatase: 66 IU/L (ref 39–117)
BUN/Creatinine Ratio: 13 (ref 9–20)
BUN: 16 mg/dL (ref 6–20)
Bilirubin Total: 0.4 mg/dL (ref 0.0–1.2)
CO2: 27 mmol/L (ref 20–29)
Calcium: 10.2 mg/dL (ref 8.7–10.2)
Chloride: 101 mmol/L (ref 96–106)
Creatinine, Ser: 1.28 mg/dL — ABNORMAL HIGH (ref 0.76–1.27)
GFR calc Af Amer: 89 mL/min/{1.73_m2} (ref >59)
GFR calc non Af Amer: 77 mL/min/{1.73_m2} (ref >59)
Globulin, Total: 3 g/dL (ref 1.5–4.5)
Glucose: 97 mg/dL (ref 65–99)
Potassium: 4.6 mmol/L (ref 3.5–5.2)
Sodium: 138 mmol/L (ref 134–144)
Total Protein: 7.8 g/dL (ref 6.0–8.5)

## 2018-05-16 LAB — GLUCOSE, POCT (MANUAL RESULT ENTRY): POC Glucose: 78 mg/dl (ref 70–99)

## 2018-05-16 MED ORDER — POLYETHYLENE GLYCOL 3350 17 GM/SCOOP PO POWD: 17.0000 g | Freq: Two times a day (BID) | ORAL | 1 refills | Status: DC | PRN

## 2018-05-16 MED ORDER — BUTALBITAL-APAP-CAFFEINE 50-325-40 MG PO TABS
1.0000 | ORAL_TABLET | Freq: Four times a day (QID) | ORAL | 0 refills | Status: AC | PRN
Start: 2018-05-16 — End: 2019-05-16

## 2018-05-16 MED ORDER — ALBUTEROL SULFATE HFA 108 (90 BASE) MCG/ACT IN AERS: 2.0000 | INHALATION_SPRAY | RESPIRATORY_TRACT | 4 refills | Status: DC | PRN

## 2018-05-16 NOTE — Patient Instructions (Addendum)
So far, all of your labs and x-rays look okay.  WBC count (see back page) is a little low, suggesting a viral infection.  Come back and see me in 2 days to discuss results and make a plan.  Come back sooner if your symptoms worsen.   For constipation  Look up "6 Reasons to Care about [place]" at [place] ([email or web address])  1) Water: Make sure you are drinking enough water daily - about 1-3 liters. 2) Fiber: Make sure you are getting enough fiber in your diet - this will make you regular - you can eat high fiber foods or use metamucil as a supplement - it is really important to drink enough water when using fiber supplements. Foods that have a lot of fiber include vegetables, fruits, beans, nuts, oatmeal, and some breads and cereals. You can tell how much fiber is in a food by reading the nutrition label. Doctors recommend eating 25 to 36 grams of fiber each day. 3) Fitness: Increasing your physical activity will help increase the natural movement of your bowels. Try to get 20-30 minutes of exercise daily.  4) Use a 9" stool in front of your toilet to rest your feet on while you have a bowel movement. This will loosen your rectal muscles to help your stool come out easier and prevent straining.   If your stools are hard or are formed balls or you have to strain a stool softener will help - use colace 2-3 capsule a day  Option 1) For gentle treatment of constipation Use Miralax 1-2 capfuls a day until your stools are soft and regular and then decrease the usage - you can use this daily  Option 2) For more aggressive treatment of constipation Use 4 capfuls of Colace and 6 doses of Miralax and drink it in 2 hours - this should result in several watery stools - if it does not repeat the next day and then go to daily miralax for a week to make sure your bowels are clean and retrained to work properly  Option 3) For the most aggressive treatment of  constipation Use 14 capfuls of Miralax in 1 gallon of fluid (gatoraid or water work well or a combination of the two) and drink over 12h - it is ok to eat during this time and then use Miralax 1 capful daily for about 2 weeks to prevent the constipation from returning   Thank you for coming in today. I hope you feel we met your needs.  Feel free to call PCP if you have any questions or further requests.  Please consider signing up for MyChart if you do not already have it, as this is a great way to communicate with me.  Best,  Whitney McVey, PA-C  IF you received an x-ray today, you will receive an invoice from [facility]. Please contact [facility] at [phone] with questions or concerns regarding your invoice.   IF you received labwork today, you will receive an invoice from [facility]. Please contact [facility] at [phone] with questions or concerns regarding your invoice.   Our billing staff will not be able to assist you with questions regarding bills from these companies.  You will be contacted with the lab results as soon as they are available. The fastest way to get your results is to activate your My Chart account. Instructions are located on the last page of this paperwork. If you have not heard from [place] regarding the results in 2 weeks,  please contact this office.

## 2018-05-16 NOTE — Progress Notes (Signed)
Victor Burns  MRN: [id number] DOB: 14-Nov-1991  PCP: Patient, No Pcp Per  Subjective:  Pt is a pleasant 27 year old male who presents to clinic for several complaints.   Headaches x 2-3 days. Feels like an "electric shock" on the right and left side of the top of his head. HA have woken him up from sleep at night. Occasionally happen throughout the day. HA last about 30 sec - 1 min. Nothing makes it better. Endorses occasional abnormal sensation of left arm.  Denies vision changes, jaw pain, throbbing pain, thunder clap HA.   Transient episodes of dizziness x 3-4 days. Episodes start with dizziness, then he gets sweaty and feels like he is about to fall over. Sometimes he cannot recall where he is or what he is doing. Episodes last about 30 seconds. Nothing makes it better. Denies syncope, seizures, difficulty with speech or phonation, slurred speech, n/t, weakness.  He had seizures when he was a small child (per pt)   Chest pain x 1 week. Pain is of left lower chest. Is better with pushing shoulders back and "cracking" his sternum. Pain is worse with deep breaths. Pain is intermittent. Pain does not radiate. Denies reflux symptoms, shob, neck pain.   Left lower quadrant pain x 2-3 weeks.  Constipation x 2-3 weeks. He used to have 3-4 BM's/week. He is now having one BM/week. He has not taken anything to make this better. He is staying well hydrated.   H/o asthma - he has been out of his inhaler x 2 weeks.   He moved into a new house about 9 months ago. He was recently informed that the house has a long history of a mold problem. He has not investigated this. He works from home.   Denies new medications or supplements.  Denies increased exercise regimen or weight lifting.  Admits to increased stressors with his family, however nothing significantly out of the ordinary.  Tested negative for HIV about 6 months ago.   Review of Systems  Constitutional: Positive for diaphoresis. Negative  for chills.  Respiratory: Negative for cough, chest tightness, shortness of breath and wheezing.   Cardiovascular: Positive for chest pain. Negative for palpitations and leg swelling.  Gastrointestinal: Positive for abdominal pain (LLQ) and nausea. Negative for diarrhea and vomiting.  Neurological: Positive for dizziness and headaches. Negative for syncope and light-headedness.  Psychiatric/Behavioral: Positive for confusion and sleep disturbance. The patient is nervous/anxious.     Patient Active Problem List   Diagnosis Date Noted  . Mild persistent asthma 10/07/2017    Current Outpatient Medications on File Prior to Visit  Medication Sig Dispense Refill  . albuterol (PROVENTIL HFA;VENTOLIN HFA) 108 (90 Base) MCG/ACT inhaler Inhale 2 puffs into the lungs every 4 (four) hours as needed for wheezing or shortness of breath. 1 Inhaler 4  . mometasone (ASMANEX) 220 MCG/INH inhaler Inhale 2 puffs into the lungs daily. 1 Inhaler 12  . amoxicillin-clavulanate (AUGMENTIN) 875-125 MG tablet Take 1 tablet by mouth every 12 (twelve) hours. (Patient not taking: Reported on 05/16/2018) 14 tablet 0  . ibuprofen (ADVIL,MOTRIN) 600 MG tablet Take 1 tablet (600 mg total) by mouth every 6 (six) hours as needed. (Patient not taking: Reported on 05/16/2018) 30 tablet 0  . oxyCODONE-acetaminophen (PERCOCET) 5-325 MG tablet Take 1 tablet by mouth every 4 (four) hours as needed for severe pain. (Patient not taking: Reported on 05/16/2018) 10 tablet 0  . predniSONE (DELTASONE) 10 MG tablet Take 4 tablets (40 mg  total) by mouth daily. (Patient not taking: Reported on 05/16/2018) 16 tablet 0   No current facility-administered medications on file prior to visit.     Allergies  Allergen Reactions  . Bee Venom Anaphylaxis  . Shellfish Allergy Anaphylaxis     Objective:  BP 130/82   Pulse 60   Temp 98.1 F (36.7 C)   Resp 20   Ht 5' 11.81" (1.824 m)   Wt 195 lb (88.5 kg)   SpO2 100%   BMI 26.59 kg/m    Orthostatic VS for the past 24 hrs:  BP- Lying Pulse- Lying BP- Sitting Pulse- Sitting BP- Standing at 0 minutes Pulse- Standing at 0 minutes  05/16/18 1134 128/78 50 141/87 52 145/89 56   Peak flow is 460L/min  Physical Exam  Constitutional: He is oriented to person, place, and time. He appears well-developed and well-nourished.  Non-toxic appearance. He does not appear ill. No distress.  Eyes: Pupils are equal, round, and reactive to light. Conjunctivae and EOM are normal.  Neck: Normal range of motion and full passive range of motion without pain. Neck supple. No muscular tenderness present. No neck rigidity.  Cardiovascular: Normal rate, regular rhythm, intact distal pulses and normal pulses.  Pulmonary/Chest: Effort normal. No respiratory distress.  Abdominal: Soft. Normal appearance and bowel sounds are normal. There is no tenderness.  Neurological: He is alert and oriented to person, place, and time. He has normal strength. No cranial nerve deficit or sensory deficit. GCS eye subscore is 4. GCS verbal subscore is 5. GCS motor subscore is 6.  Skin: Skin is warm and dry.  Psychiatric: He has a normal mood and affect. His behavior is normal. Judgment and thought content normal.  Vitals reviewed. Dg Chest 2 View  Result Date: 05/16/2018 CLINICAL DATA:  Chest pain.  Dizziness. EXAM: CHEST - 2 VIEW COMPARISON:  09/29/2017 FINDINGS: Heart size is normal. Mediastinal shadows are normal. The lungs are clear. No bronchial thickening. No infiltrate, mass, effusion or collapse. Pulmonary vascularity is normal. No bony abnormality. IMPRESSION: Normal chest Electronically Signed   By: Nelson Chimes M.D.   On: 05/16/2018 11:29   Dg Abd 2 Views  Result Date: 05/16/2018 CLINICAL DATA:  Difficulty with bowel movements. EXAM: ABDOMEN - 2 VIEW COMPARISON:  CT pelvis 01/31/2018 FINDINGS: There is no evidence of intraperitoneal free air. Gas and an ordinary amount of stool are present in nondilated colon.  There is no evidence of bowel obstruction. The visualized lung bases are clear. The osseous structures are unremarkable. IMPRESSION: Negative. Electronically Signed   By: Logan Bores M.D.   On: 05/16/2018 10:41    Results for orders placed or performed in visit on 05/16/18  POCT CBC  Result Value Ref Range   WBC 3.6 (A) 4.6 - 10.2 K/uL   Lymph, poc 1.6 0.6 - 3.4   POC LYMPH PERCENT 43.1 10 - 50 %L   MID (cbc) 0.1 0 - 0.9   POC MID % 3.5 0 - 12 %M   POC Granulocyte 1.9 (A) 2 - 6.9   Granulocyte percent 53.4 37 - 80 %G   RBC 4.92 4.69 - 6.13 M/uL   Hemoglobin 14.4 14.1 - 18.1 g/dL   HCT, POC 44.2 43.5 - 53.7 %   MCV 89.9 80 - 97 fL   MCH, POC 29.3 27 - 31.2 pg   MCHC 32.6 31.8 - 35.4 g/dL   RDW, POC 13.4 %   Platelet Count, POC 316 142 - 424 K/uL  MPV 6.7 0 - 99.8 fL  POCT urinalysis dipstick  Result Value Ref Range   Color, UA yellow yellow   Clarity, UA clear clear   Glucose, UA negative negative mg/dL   Bilirubin, UA negative negative   Ketones, POC UA negative negative mg/dL   Spec Grav, UA 1.025 1.010 - 1.025   Blood, UA negative negative   pH, UA 6.0 5.0 - 8.0   Protein Ur, POC negative negative mg/dL   Urobilinogen, UA 0.2 0.2 or 1.0 E.U./dL   Nitrite, UA Negative Negative   Leukocytes, UA Negative Negative  POCT glucose (manual entry)  Result Value Ref Range   POC Glucose 78 70 - 99 mg/dl   EKG shows peaked anterior T-waves.  Assessment and Plan :  This case was discussed with Dr. Brigitte Pulse.   1. Chest pain, unspecified type - Pt presents with an interesting constellation of symptoms including intermittent "electric shock" HA, episodes of dizziness and confusion, intermittent chest pain, and constipation. UA, CBC and glucose are negative. WBC count is decreased at 3.6. Chest and abdominal x-rays are negative. He is not orthostatic. Peak flow is 460L/min. Other labs are pending. RTC in 2 days to follow-up and discuss lab work and recheck symptoms. In the meantime, will  treat for constipation with Miralax, HA with Fioricet.  - EKG 12-Lead - CMP14+EGFR - POCT CBC - POCT urinalysis dipstick - Sedimentation Rate - HIV antibody - C-reactive protein - DG Chest 2 View; Future - Peak flow meter  2. LLQ pain - DG Abd 2 Views; Future  3. Dizziness - POCT glucose (manual entry) - Sedimentation Rate - HIV antibody - C-reactive protein - TSH - DG Chest 2 View; Future - Peak flow meter - Orthostatic vital signs  4. Constipation, unspecified constipation type - TSH - polyethylene glycol powder (GLYCOLAX/MIRALAX) powder; Take 17 g by mouth 2 (two) times daily as needed.  Dispense: 578 g; Refill: 1  5. Nonspecific abnormal electrocardiogram (ECG) (EKG) - Magnesium  6. Acute nonintractable headache, unspecified headache type - butalbital-acetaminophen-caffeine (FIORICET, ESGIC) 50-325-40 MG tablet; Take 1-2 tablets by mouth every 6 (six) hours as needed for headache.  Dispense: 20 tablet; Refill: 0  7. Uncomplicated asthma, unspecified asthma severity, unspecified whether persistent - albuterol (PROVENTIL HFA;VENTOLIN HFA) 108 (90 Base) MCG/ACT inhaler; Inhale 2 puffs into the lungs every 4 (four) hours as needed for wheezing or shortness of breath.  Dispense: 1 Inhaler; Refill: 4  Whitney Olusegun Gerstenberger, PA-C  [facility] [place] 05/16/2018 10:00 AM

## 2018-05-17 LAB — C-REACTIVE PROTEIN: CRP: 0.3 mg/L (ref 0.0–4.9)

## 2018-05-17 LAB — TSH: TSH: 1.11 u[IU]/mL (ref 0.450–4.500)

## 2018-05-17 LAB — HIV ANTIBODY (ROUTINE TESTING W REFLEX): HIV Screen 4th Generation wRfx: NONREACTIVE

## 2018-05-17 LAB — MAGNESIUM: Magnesium: 2.2 mg/dL (ref 1.6–2.3)

## 2018-05-17 LAB — SEDIMENTATION RATE: Sed Rate: 8 mm/hr (ref 0–15)

## 2018-05-18 ENCOUNTER — Emergency Department (HOSPITAL_COMMUNITY)
Admission: EM | Admit: 2018-05-18 | Discharge: 2018-05-18 | Disposition: A | Discharge status: Home / Self Care | Payer: Managed Care, Other (non HMO) | Attending: Emergency Medicine | Admitting: Emergency Medicine

## 2018-05-18 ENCOUNTER — Emergency Department (HOSPITAL_COMMUNITY): Payer: Managed Care, Other (non HMO)

## 2018-05-18 ENCOUNTER — Encounter (HOSPITAL_COMMUNITY): Payer: Self-pay | Admitting: Emergency Medicine

## 2018-05-18 DIAGNOSIS — J45909 Unspecified asthma, uncomplicated: Secondary | ICD-10-CM | POA: Insufficient documentation

## 2018-05-18 DIAGNOSIS — R51 Headache: Secondary | ICD-10-CM | POA: Diagnosis not present

## 2018-05-18 DIAGNOSIS — F17210 Nicotine dependence, cigarettes, uncomplicated: Secondary | ICD-10-CM | POA: Insufficient documentation

## 2018-05-18 DIAGNOSIS — R519 Headache, unspecified: Secondary | ICD-10-CM

## 2018-05-18 DIAGNOSIS — R200 Anesthesia of skin: Secondary | ICD-10-CM | POA: Diagnosis not present

## 2018-05-18 LAB — CBC WITH DIFFERENTIAL/PLATELET
Basophils Absolute: 0 10*3/uL (ref 0.0–0.1)
Basophils Relative: 0 %
EOS ABS: 0 10*3/uL (ref 0.0–0.7)
EOS PCT: 0 %
HCT: 42.8 % (ref 39.0–52.0)
Hemoglobin: 14.6 g/dL (ref 13.0–17.0)
LYMPHS PCT: 35 %
Lymphs Abs: 1.9 10*3/uL (ref 0.7–4.0)
MCH: 30.5 pg (ref 26.0–34.0)
MCHC: 34.1 g/dL (ref 30.0–36.0)
MCV: 89.5 fL (ref 78.0–100.0)
MONO ABS: 0.4 10*3/uL (ref 0.1–1.0)
Monocytes Relative: 7 %
Neutro Abs: 3.2 10*3/uL (ref 1.7–7.7)
Neutrophils Relative %: 58 %
PLATELETS: 279 10*3/uL (ref 150–400)
RBC: 4.78 MIL/uL (ref 4.22–5.81)
RDW: 13.1 % (ref 11.5–15.5)
WBC: 5.5 10*3/uL (ref 4.0–10.5)

## 2018-05-18 LAB — I-STAT TROPONIN, ED
TROPONIN I, POC: 0 ng/mL (ref 0.00–0.08)
Troponin i, poc: 0 ng/mL (ref 0.00–0.08)

## 2018-05-18 MED ORDER — SUMATRIPTAN SUCCINATE 6 MG/0.5ML ~~LOC~~ SOLN
6.0000 mg | Freq: Once | SUBCUTANEOUS | Status: AC
  Administered 2018-05-18: 6 mg via SUBCUTANEOUS
  Filled 2018-05-18: qty 0.5

## 2018-05-18 NOTE — ED Provider Notes (Signed)
[facility]-EMERGENCY DEPT Provider Note  CSN: [id number] Arrival date & time: 05/18/18  1032  History   Chief Complaint Chief Complaint  Patient presents with  . Headache  . arm tingling/numbness   HPI Rolena InfanteUrban Wyke is a [age] y.o. male with a medical history of asthma who presented to the ED for headache and left arm numbness. Patient describes bilateral parietal headache x2 weeks that is sharp and feels "like an electric shock." Associated symptoms: scotoma and chest pain. Denies changes in visual acuity, eye pain, N/V, otalgia, facial pain, facial droop, slurred speech or paresthesias during an episode. Patient has tried ibuprofen and Fiorcet prior to coming to the ED with minimal relief. He states he has had headaches in the past, but none like this.  Patient also complains of intermittent left forearm and hand x1 week. The numbness does not coincide with the headaches. Patient denies injury or trauma to the area. Denies pain, swelling or weakness.  Additional history obtained by medical chart. Patient seen by his PCP on 05/16/18 for multiple physical complaints. Labs from that visit were all normal. Abdominal and chest x-ray taken then were also normal.   Past Medical History:  Diagnosis Date  . Abscess   . Asthma     Patient Active Problem List   Diagnosis Date Noted  . Mild persistent asthma 10/07/2017    Past Surgical History:  Procedure Laterality Date  . KNEE ARTHROSCOPY          Home Medications    Prior to Admission medications   Medication Sig Start Date End Date Taking? Authorizing Provider  albuterol (PROVENTIL HFA;VENTOLIN HFA) 108 (90 Base) MCG/ACT inhaler Inhale 2 puffs into the lungs every 4 (four) hours as needed for wheezing or shortness of breath. 05/16/18  Yes McVey, Madelaine BhatElizabeth Whitney, PA-C  butalbital-acetaminophen-caffeine (FIORICET, [place]) [phone] MG tablet Take 1-2 tablets by mouth every 6 (six) hours as needed for headache.  05/16/18 05/16/19 Yes McVey, Madelaine BhatElizabeth Whitney, PA-C  ibuprofen (ADVIL,MOTRIN) 600 MG tablet Take 1 tablet (600 mg total) by mouth every 6 (six) hours as needed. 09/29/17  Yes Maczis, Elmer SowMichael M, PA-C  polyethylene glycol powder (GLYCOLAX/MIRALAX) powder Take 17 g by mouth 2 (two) times daily as needed. Patient taking differently: Take 17 g by mouth 2 (two) times daily as needed for mild constipation.  05/16/18  Yes McVey, Madelaine BhatElizabeth Whitney, PA-C    Family History No family history on file.  Social History Social History   Tobacco Use  . Smoking status: Current Every Day Smoker    Packs/day: 0.30    Types: Cigarettes  . Smokeless tobacco: Never Used  Substance Use Topics  . Alcohol use: Yes    Alcohol/week: 0.0 oz    Comment: at least a beer a day  . Drug use: No     Allergies   Bee venom and Shellfish allergy   Review of Systems Review of Systems  Constitutional: Negative for activity change, appetite change, chills, diaphoresis, fatigue and fever.  Eyes: Positive for visual disturbance. Negative for photophobia and pain.  Respiratory: Negative for cough, chest tightness, shortness of breath and wheezing.   Cardiovascular: Positive for chest pain. Negative for palpitations and leg swelling.  Gastrointestinal: Negative for abdominal pain, constipation, diarrhea, nausea and vomiting.  Endocrine: Negative.   Genitourinary: Negative for dysuria and hematuria.  Skin: Negative.   Neurological: Positive for dizziness, light-headedness, numbness and headaches. Negative for syncope.  Hematological: Negative.      Physical Exam Updated  Vital Signs BP (!) 144/89   Pulse 83   Temp 98.3 F (36.8 C)   Resp 10   SpO2 100%   Physical Exam  Constitutional: He appears well-developed and well-nourished. He is cooperative.  Non-toxic appearance. He does not have a sickly appearance. No distress.  HENT:  Head: Normocephalic and atraumatic.  Eyes: Pupils are equal, round, and reactive  to light. Conjunctivae and EOM are normal.  Neck: Normal range of motion. Neck supple.  Cardiovascular: Normal rate, regular rhythm, normal heart sounds and intact distal pulses.  Pulmonary/Chest: Effort normal and breath sounds normal.  Abdominal: Soft. Bowel sounds are normal.  Musculoskeletal: Normal range of motion.  Neurological: He is alert. He has normal strength. He displays normal reflexes. No cranial nerve deficit or sensory deficit. GCS eye subscore is 4. GCS verbal subscore is 5. GCS motor subscore is 6.  Skin: Skin is warm and dry. Capillary refill takes less than 2 seconds.  Nursing note and vitals reviewed.    ED Treatments / Results  Labs (all labs ordered are listed, but only abnormal results are displayed) Labs Reviewed  CBC WITH DIFFERENTIAL/PLATELET  I-STAT TROPONIN, ED  I-STAT TROPONIN, ED    EKG EKG Interpretation  Date/Time:  Thursday May 18 2018 14:37:44 EDT Ventricular Rate:  54 PR Interval:    QRS Duration: 91 QT Interval:  427 QTC Calculation: 405 R Axis:   74 Text Interpretation:  Sinus rhythm Inferior infarct, acute (LCx) ST elevation, consider anterolateral injury Confirmed by Donnetta Hutching ([id number]) on 05/18/2018 2:57:39 PM   Radiology Ct Head Wo Contrast  Result Date: 05/18/2018 CLINICAL DATA:  Headache, acute, severe, worst HA of life EXAM: CT HEAD WITHOUT CONTRAST TECHNIQUE: Contiguous axial images were obtained from the base of the skull through the vertex without intravenous contrast. COMPARISON:  08/10/2016 FINDINGS: Brain: No evidence of acute infarction, hemorrhage, hydrocephalus, extra-axial collection or mass lesion/mass effect. Vascular: No hyperdense vessel or unexpected calcification. Skull: No osseous abnormality. Sinuses/Orbits: Visualized paranasal sinuses are clear. Visualized mastoid sinuses are clear. Visualized orbits demonstrate no focal abnormality. Other: None IMPRESSION: 1. No acute intracranial pathology. Electronically Signed    By: Elige Ko   On: 05/18/2018 17:21    Procedures Procedures (including critical care time)  Medications Ordered in ED Medications  SUMAtriptan (IMITREX) injection 6 mg (6 mg Subcutaneous Given 05/18/18 1449)     Initial Impression / Assessment and Plan / ED Course  Triage vital signs and the nursing notes have been reviewed.  Pertinent labs & imaging results that were available during care of the patient were reviewed and considered in medical decision making (see chart for details).  Clinical Course as of May 18 1734  Thu May 18, 2018  1455 Imitrex x1 given for headache.   [GM]  1532 Labs are normal.   [GM]  1532 EKG has unusual ST elevations, but appears to be repolarization activity. EKG looks similar to past EKGs and in conjunction with negative troponin, acute cardiac event is ruled out.   [GM]  1729 CT Head was normal. Showed no masses, hemorrhages, infarcts or other abnormalities that could be contributing to pt's headache   [GM]    Clinical Course User Index [GM] Mortis, Sharyon Medicus, PA-C   Patient presents in no acute distress. Despite his most physical complaints today, patient is well appearing. Patient has no significant medical history or risk factors. His labs, physical exam and imaging are reassuring and useful in ruling out an acute neuro, cardiac  or pulmonary process. Patient has no focal neuro deficits. Headache is likely idiopathic or migraine in nature. Education provided on appropriate OTC and supportive treatment.   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 No bony tenderness, deformity or swelling noted that would warrant imaging at this time. On exam, patient has full active and passive ROM, sensation and strength. Education provided on OTC and supportive treatment options.   Referral to neurology given if pt continues to have headache and left arm numbness despite OTC  and supportive treatments. Education provided on s/s that warrant return to the ED.  Final Clinical Impressions(s) / ED Diagnoses  1. Headache. Idiopathic or migraine etiology. Pt did not like Imitrex that was administered in the ED. He was advised the NSAIDs or combination medications like Fiorcet which he already prescribed are appropriate options. Advised to follow-up with PCP if headache persists > 4 weeks. 2. Left Arm Numbness. Possible nerve impingement syndrome. OTC and supportive treatment education given.    Dispo: Home. After thorough clinical evaluation, this patient is determined to be medically stable and can be safely discharged with the previously mentioned treatment and/or outpatient follow-up/referral(s). At this time, there are no other apparent medical conditions that require further screening, evaluation or treatment.  Final diagnoses:  Acute nonintractable headache, unspecified headache type    ED Discharge Orders    None        Reva Bores 05/18/18 1735    Donnetta Hutching, MD 05/19/18 1023

## 2018-05-18 NOTE — ED Triage Notes (Signed)
Per pt, states headache and left arm numbness and tingling for 1 1/2 weeks-states he saw PCP on Tuesday and had lab work done and was prescribed med for headache but forgot the name-states no relief-states he also might be getting a boil on his buttocks-no CP. No SOB

## 2018-05-18 NOTE — Discharge Instructions (Addendum)
Your Head CT was normal.  You may continue using the Fiorcet or OTC pain medications (Tylenol, Ibuprofen, Naproxen, etc.) for your headache. Keep your appointment with your PCP to discuss this further.  I have put information for a neurologist that you can call if the headache and left arm numbness become more persistent and severe.  Reasons to return to the ED, urgent care or PCP: worst headache of your life (thunderclap quality), slurred speech, facial droop, blurry or loss of vision, dizziness or passing out.

## 2018-05-19 ENCOUNTER — Ambulatory Visit: Payer: Managed Care, Other (non HMO) | Admitting: Physician Assistant

## 2018-05-25 ENCOUNTER — Encounter: Payer: Self-pay | Admitting: Physician Assistant

## 2018-06-16 ENCOUNTER — Emergency Department (HOSPITAL_COMMUNITY): Payer: Managed Care, Other (non HMO)

## 2018-06-16 ENCOUNTER — Encounter (HOSPITAL_COMMUNITY): Payer: Self-pay | Admitting: Emergency Medicine

## 2018-06-16 ENCOUNTER — Observation Stay (HOSPITAL_COMMUNITY): Payer: Managed Care, Other (non HMO) | Admitting: Anesthesiology

## 2018-06-16 ENCOUNTER — Encounter (HOSPITAL_COMMUNITY)
Admission: EM | Disposition: A | Discharge status: Home / Self Care | Payer: Self-pay | Source: Home / Self Care | Attending: Emergency Medicine

## 2018-06-16 ENCOUNTER — Observation Stay (HOSPITAL_COMMUNITY)
Admission: EM | Admit: 2018-06-16 | Discharge: 2018-06-17 | Disposition: A | Discharge status: Home / Self Care | Payer: Managed Care, Other (non HMO) | Attending: General Surgery | Admitting: General Surgery

## 2018-06-16 ENCOUNTER — Other Ambulatory Visit: Payer: Self-pay

## 2018-06-16 DIAGNOSIS — F17200 Nicotine dependence, unspecified, uncomplicated: Secondary | ICD-10-CM | POA: Diagnosis not present

## 2018-06-16 DIAGNOSIS — K611 Rectal abscess: Secondary | ICD-10-CM | POA: Diagnosis not present

## 2018-06-16 DIAGNOSIS — M1990 Unspecified osteoarthritis, unspecified site: Secondary | ICD-10-CM | POA: Diagnosis not present

## 2018-06-16 DIAGNOSIS — D649 Anemia, unspecified: Secondary | ICD-10-CM | POA: Insufficient documentation

## 2018-06-16 DIAGNOSIS — F419 Anxiety disorder, unspecified: Secondary | ICD-10-CM | POA: Diagnosis not present

## 2018-06-16 DIAGNOSIS — L0231 Cutaneous abscess of buttock: Secondary | ICD-10-CM

## 2018-06-16 DIAGNOSIS — F1010 Alcohol abuse, uncomplicated: Secondary | ICD-10-CM | POA: Diagnosis not present

## 2018-06-16 DIAGNOSIS — E876 Hypokalemia: Secondary | ICD-10-CM | POA: Diagnosis present

## 2018-06-16 HISTORY — DX: Unspecified osteoarthritis, unspecified site: M19.90

## 2018-06-16 HISTORY — PX: INCISION AND DRAINAGE PERIRECTAL ABSCESS: SHX1804

## 2018-06-16 LAB — URINALYSIS, COMPLETE (UACMP) WITH MICROSCOPIC
BACTERIA UA: NONE SEEN
Bilirubin Urine: NEGATIVE
Glucose, UA: NEGATIVE mg/dL
HGB URINE DIPSTICK: NEGATIVE
KETONES UR: 20 mg/dL — AB
Leukocytes, UA: NEGATIVE
NITRITE: NEGATIVE
PROTEIN: NEGATIVE mg/dL
Specific Gravity, Urine: 1.024 (ref 1.005–1.030)
pH: 6 (ref 5.0–8.0)

## 2018-06-16 LAB — CBC WITH DIFFERENTIAL/PLATELET
BASOS ABS: 0 10*3/uL (ref 0.0–0.1)
BASOS PCT: 0 %
EOS ABS: 0 10*3/uL (ref 0.0–0.7)
EOS PCT: 0 %
HCT: 37 % — ABNORMAL LOW (ref 39.0–52.0)
Hemoglobin: 12.9 g/dL — ABNORMAL LOW (ref 13.0–17.0)
Lymphocytes Relative: 13 %
Lymphs Abs: 1.4 10*3/uL (ref 0.7–4.0)
MCH: 30 pg (ref 26.0–34.0)
MCHC: 34.9 g/dL (ref 30.0–36.0)
MCV: 86 fL (ref 78.0–100.0)
MONO ABS: 1.2 10*3/uL — AB (ref 0.1–1.0)
MONOS PCT: 11 %
NEUTROS ABS: 8.5 10*3/uL — AB (ref 1.7–7.7)
Neutrophils Relative %: 76 %
PLATELETS: 319 10*3/uL (ref 150–400)
RBC: 4.3 MIL/uL (ref 4.22–5.81)
RDW: 12.8 % (ref 11.5–15.5)
WBC: 11.2 10*3/uL — ABNORMAL HIGH (ref 4.0–10.5)

## 2018-06-16 LAB — HEPATIC FUNCTION PANEL
ALBUMIN: 3.5 g/dL (ref 3.5–5.0)
ALT: 16 U/L (ref 0–44)
AST: 20 U/L (ref 15–41)
Alkaline Phosphatase: 52 U/L (ref 38–126)
BILIRUBIN DIRECT: 0.1 mg/dL (ref 0.0–0.2)
Indirect Bilirubin: 0.7 mg/dL (ref 0.3–0.9)
Total Bilirubin: 0.8 mg/dL (ref 0.3–1.2)
Total Protein: 6.7 g/dL (ref 6.5–8.1)

## 2018-06-16 LAB — BASIC METABOLIC PANEL
ANION GAP: 13 (ref 5–15)
BUN: 12 mg/dL (ref 6–20)
CALCIUM: 9.5 mg/dL (ref 8.9–10.3)
CO2: 21 mmol/L — AB (ref 22–32)
CREATININE: 1.06 mg/dL (ref 0.61–1.24)
Chloride: 101 mmol/L (ref 98–111)
GFR calc Af Amer: >60 mL/min (ref >60)
GLUCOSE: 106 mg/dL — AB (ref 70–99)
Potassium: 2.9 mmol/L — ABNORMAL LOW (ref 3.5–5.1)
Sodium: 135 mmol/L (ref 135–145)

## 2018-06-16 LAB — PROTIME-INR
INR: 1.14
Prothrombin Time: 14.5 seconds (ref 11.4–15.2)

## 2018-06-16 LAB — MRSA PCR SCREENING: MRSA by PCR: NEGATIVE

## 2018-06-16 LAB — MAGNESIUM: MAGNESIUM: 1.8 mg/dL (ref 1.7–2.4)

## 2018-06-16 SURGERY — INCISION AND DRAINAGE, ABSCESS, PERIRECTAL
Anesthesia: General

## 2018-06-16 MED ORDER — LORAZEPAM 2 MG/ML IJ SOLN: 0.0000 mg | Freq: Four times a day (QID) | INTRAMUSCULAR | Status: DC

## 2018-06-16 MED ORDER — FENTANYL CITRATE (PF) 250 MCG/5ML IJ SOLN
INTRAMUSCULAR | Status: AC
Start: 2018-06-16 — End: ?
  Filled 2018-06-16: qty 5

## 2018-06-16 MED ORDER — IOPAMIDOL (ISOVUE-300) INJECTION 61%
100.0000 mL | Freq: Once | INTRAVENOUS | Status: AC | PRN
  Administered 2018-06-16: 100 mL via INTRAVENOUS

## 2018-06-16 MED ORDER — ONDANSETRON HCL 4 MG/2ML IJ SOLN
4.0000 mg | Freq: Once | INTRAMUSCULAR | Status: AC
  Administered 2018-06-16: 4 mg via INTRAVENOUS
  Filled 2018-06-16: qty 2

## 2018-06-16 MED ORDER — MIDAZOLAM HCL 2 MG/2ML IJ SOLN
INTRAMUSCULAR | Status: AC
  Filled 2018-06-16: qty 2

## 2018-06-16 MED ORDER — FOLIC ACID 1 MG PO TABS
1.0000 mg | ORAL_TABLET | Freq: Every day | ORAL | Status: DC
  Administered 2018-06-17: 1 mg via ORAL
  Filled 2018-06-16: qty 1

## 2018-06-16 MED ORDER — PROPOFOL 10 MG/ML IV BOLUS
INTRAVENOUS | Status: AC
  Filled 2018-06-16: qty 40

## 2018-06-16 MED ORDER — LACTATED RINGERS IV SOLN
INTRAVENOUS | Status: DC | PRN
  Administered 2018-06-16: 18:00:00 via INTRAVENOUS

## 2018-06-16 MED ORDER — MORPHINE SULFATE (PF) 4 MG/ML IV SOLN
4.0000 mg | Freq: Once | INTRAVENOUS | Status: AC
  Administered 2018-06-16: 4 mg via INTRAVENOUS
  Filled 2018-06-16: qty 1

## 2018-06-16 MED ORDER — LORAZEPAM 2 MG/ML IJ SOLN
1.0000 mg | Freq: Four times a day (QID) | INTRAMUSCULAR | Status: DC | PRN
  Administered 2018-06-16: 1 mg via INTRAVENOUS
  Filled 2018-06-16: qty 1

## 2018-06-16 MED ORDER — DIPHENHYDRAMINE HCL 12.5 MG/5ML PO ELIX: 12.5000 mg | ORAL_SOLUTION | Freq: Four times a day (QID) | ORAL | Status: DC | PRN

## 2018-06-16 MED ORDER — OXYCODONE-ACETAMINOPHEN 5-325 MG PO TABS
1.0000 | ORAL_TABLET | ORAL | Status: DC | PRN
  Administered 2018-06-17: 2 via ORAL
  Filled 2018-06-16: qty 2

## 2018-06-16 MED ORDER — POTASSIUM CHLORIDE CRYS ER 20 MEQ PO TBCR
40.0000 meq | EXTENDED_RELEASE_TABLET | Freq: Once | ORAL | Status: AC
  Administered 2018-06-16: 40 meq via ORAL
  Filled 2018-06-16: qty 2

## 2018-06-16 MED ORDER — KCL IN DEXTROSE-NACL 40-5-0.9 MEQ/L-%-% IV SOLN
INTRAVENOUS | Status: DC
  Administered 2018-06-16: 10:00:00 via INTRAVENOUS
  Filled 2018-06-16 (×2): qty 1000

## 2018-06-16 MED ORDER — LIDOCAINE 2% (20 MG/ML) 5 ML SYRINGE
INTRAMUSCULAR | Status: DC | PRN
  Administered 2018-06-16: 100 mg via INTRAVENOUS

## 2018-06-16 MED ORDER — POTASSIUM CHLORIDE CRYS ER 20 MEQ PO TBCR
20.0000 meq | EXTENDED_RELEASE_TABLET | Freq: Once | ORAL | Status: AC
  Administered 2018-06-16: 20 meq via ORAL
  Filled 2018-06-16: qty 1

## 2018-06-16 MED ORDER — MEPERIDINE HCL 50 MG/ML IJ SOLN: 6.2500 mg | INTRAMUSCULAR | Status: DC | PRN

## 2018-06-16 MED ORDER — FENTANYL CITRATE (PF) 100 MCG/2ML IJ SOLN
INTRAMUSCULAR | Status: DC | PRN
  Administered 2018-06-16: 50 ug via INTRAVENOUS
  Administered 2018-06-16: 100 ug via INTRAVENOUS
  Administered 2018-06-16: 50 ug via INTRAVENOUS

## 2018-06-16 MED ORDER — KETOROLAC TROMETHAMINE 30 MG/ML IJ SOLN: 30.0000 mg | Freq: Once | INTRAMUSCULAR | Status: DC | PRN

## 2018-06-16 MED ORDER — HYDROMORPHONE HCL 1 MG/ML IJ SOLN
INTRAMUSCULAR | Status: DC | PRN
  Administered 2018-06-16 (×2): 0.5 mg via INTRAVENOUS

## 2018-06-16 MED ORDER — HYDROMORPHONE HCL 1 MG/ML IJ SOLN
1.0000 mg | Freq: Once | INTRAMUSCULAR | Status: AC
  Administered 2018-06-16: 1 mg via INTRAVENOUS
  Filled 2018-06-16: qty 1

## 2018-06-16 MED ORDER — SODIUM CHLORIDE 0.9 % IV BOLUS
1000.0000 mL | Freq: Once | INTRAVENOUS | Status: AC
  Administered 2018-06-16: 1000 mL via INTRAVENOUS

## 2018-06-16 MED ORDER — HYDROMORPHONE HCL 1 MG/ML IJ SOLN: 0.2500 mg | INTRAMUSCULAR | Status: DC | PRN

## 2018-06-16 MED ORDER — VITAMIN B-1 100 MG PO TABS
100.0000 mg | ORAL_TABLET | Freq: Every day | ORAL | Status: DC
  Administered 2018-06-17: 100 mg via ORAL
  Filled 2018-06-16: qty 1

## 2018-06-16 MED ORDER — ADULT MULTIVITAMIN W/MINERALS CH
1.0000 | ORAL_TABLET | Freq: Every day | ORAL | Status: DC
  Administered 2018-06-17: 1 via ORAL
  Filled 2018-06-16: qty 1

## 2018-06-16 MED ORDER — NICOTINE 7 MG/24HR TD PT24
7.0000 mg | MEDICATED_PATCH | Freq: Every day | TRANSDERMAL | Status: DC
  Administered 2018-06-16 – 2018-06-17 (×2): 7 mg via TRANSDERMAL
  Filled 2018-06-16 (×2): qty 1

## 2018-06-16 MED ORDER — KETOROLAC TROMETHAMINE 30 MG/ML IJ SOLN
30.0000 mg | Freq: Three times a day (TID) | INTRAMUSCULAR | Status: DC
  Administered 2018-06-16 – 2018-06-17 (×4): 30 mg via INTRAVENOUS
  Filled 2018-06-16 (×4): qty 1

## 2018-06-16 MED ORDER — MIDAZOLAM HCL 5 MG/5ML IJ SOLN
INTRAMUSCULAR | Status: DC | PRN
  Administered 2018-06-16: 2 mg via INTRAVENOUS

## 2018-06-16 MED ORDER — HYDROMORPHONE HCL 2 MG/ML IJ SOLN
INTRAMUSCULAR | Status: AC
Start: 2018-06-16 — End: ?
  Filled 2018-06-16: qty 1

## 2018-06-16 MED ORDER — ONDANSETRON 4 MG PO TBDP
4.0000 mg | ORAL_TABLET | Freq: Four times a day (QID) | ORAL | Status: DC | PRN
Start: 2018-06-16 — End: 2018-06-17
  Administered 2018-06-17: 4 mg via ORAL
  Filled 2018-06-16: qty 1

## 2018-06-16 MED ORDER — PROMETHAZINE HCL 25 MG/ML IJ SOLN: 6.2500 mg | INTRAMUSCULAR | Status: DC | PRN

## 2018-06-16 MED ORDER — THIAMINE HCL 100 MG/ML IJ SOLN
100.0000 mg | Freq: Every day | INTRAMUSCULAR | Status: DC
  Administered 2018-06-16: 100 mg via INTRAVENOUS
  Filled 2018-06-16: qty 2

## 2018-06-16 MED ORDER — ARTIFICIAL TEARS OP OINT
TOPICAL_OINTMENT | OPHTHALMIC | Status: AC
  Filled 2018-06-16: qty 3.5

## 2018-06-16 MED ORDER — DEXAMETHASONE SODIUM PHOSPHATE 10 MG/ML IJ SOLN
INTRAMUSCULAR | Status: DC | PRN
  Administered 2018-06-16: 10 mg via INTRAVENOUS

## 2018-06-16 MED ORDER — LIDOCAINE-EPINEPHRINE (PF) 2 %-1:200000 IJ SOLN: 10.0000 mL | Freq: Once | INTRAMUSCULAR | Status: DC

## 2018-06-16 MED ORDER — TETANUS-DIPHTH-ACELL PERTUSSIS 5-2.5-18.5 LF-MCG/0.5 IM SUSP
0.5000 mL | Freq: Once | INTRAMUSCULAR | Status: AC
  Administered 2018-06-16: 0.5 mL via INTRAMUSCULAR
  Filled 2018-06-16: qty 0.5

## 2018-06-16 MED ORDER — DIPHENHYDRAMINE HCL 50 MG/ML IJ SOLN: 12.5000 mg | Freq: Four times a day (QID) | INTRAMUSCULAR | Status: DC | PRN

## 2018-06-16 MED ORDER — LORAZEPAM 2 MG/ML IJ SOLN: 0.0000 mg | Freq: Two times a day (BID) | INTRAMUSCULAR | Status: DC

## 2018-06-16 MED ORDER — LORAZEPAM 1 MG PO TABS: 1.0000 mg | ORAL_TABLET | Freq: Four times a day (QID) | ORAL | Status: DC | PRN

## 2018-06-16 MED ORDER — KCL IN DEXTROSE-NACL 40-5-0.9 MEQ/L-%-% IV SOLN
INTRAVENOUS | Status: DC
  Administered 2018-06-16: 1000 mL via INTRAVENOUS
  Filled 2018-06-16 (×2): qty 1000

## 2018-06-16 MED ORDER — MORPHINE SULFATE (PF) 2 MG/ML IV SOLN
2.0000 mg | INTRAVENOUS | Status: DC | PRN
  Administered 2018-06-16 (×2): 3 mg via INTRAVENOUS
  Administered 2018-06-17: 2 mg via INTRAVENOUS
  Filled 2018-06-16: qty 2
  Filled 2018-06-16: qty 1
  Filled 2018-06-16: qty 2

## 2018-06-16 MED ORDER — PIPERACILLIN-TAZOBACTAM 3.375 G IVPB
3.3750 g | Freq: Three times a day (TID) | INTRAVENOUS | Status: DC
  Administered 2018-06-16 – 2018-06-17 (×4): 3.375 g via INTRAVENOUS
  Filled 2018-06-16 (×4): qty 50

## 2018-06-16 MED ORDER — PROPOFOL 10 MG/ML IV BOLUS
INTRAVENOUS | Status: DC | PRN
  Administered 2018-06-16: 200 mg via INTRAVENOUS

## 2018-06-16 MED ORDER — IOPAMIDOL (ISOVUE-300) INJECTION 61%
INTRAVENOUS | Status: AC
  Filled 2018-06-16: qty 100

## 2018-06-16 MED ORDER — ONDANSETRON HCL 4 MG/2ML IJ SOLN
4.0000 mg | Freq: Four times a day (QID) | INTRAMUSCULAR | Status: DC | PRN
Start: 2018-06-16 — End: 2018-06-17

## 2018-06-16 MED ORDER — ONDANSETRON HCL 4 MG/2ML IJ SOLN
INTRAMUSCULAR | Status: DC | PRN
  Administered 2018-06-16: 4 mg via INTRAVENOUS

## 2018-06-16 MED ORDER — POTASSIUM CHLORIDE 10 MEQ/100ML IV SOLN
10.0000 meq | Freq: Once | INTRAVENOUS | Status: AC
  Administered 2018-06-16: 10 meq via INTRAVENOUS
  Filled 2018-06-16: qty 100

## 2018-06-16 SURGICAL SUPPLY — 18 items
BLADE SURG SZ20 CARB STEEL (BLADE) ×2 IMPLANT
COVER SURGICAL LIGHT HANDLE (MISCELLANEOUS) ×2 IMPLANT
DRAPE SHEET LG 3/4 BI-LAMINATE (DRAPES) ×4 IMPLANT
ELECT PENCIL ROCKER SW 15FT (MISCELLANEOUS) ×2 IMPLANT
GAUZE 4X4 16PLY RFD (DISPOSABLE) ×2 IMPLANT
GAUZE IODOFORM PACK 1/2 7832 (GAUZE/BANDAGES/DRESSINGS) ×2 IMPLANT
GAUZE SPONGE 4X4 12PLY STRL (GAUZE/BANDAGES/DRESSINGS) ×2 IMPLANT
GLOVE BIOGEL M 8.0 STRL (GLOVE) ×6 IMPLANT
GLOVE BIOGEL PI IND STRL 7.0 (GLOVE) ×1 IMPLANT
GLOVE BIOGEL PI INDICATOR 7.0 (GLOVE) ×1
GOWN SPEC L4 XLG W/TWL (GOWN DISPOSABLE) ×2 IMPLANT
GOWN STRL REUS W/TWL LRG LVL3 (GOWN DISPOSABLE) ×4 IMPLANT
GOWN STRL REUS W/TWL XL LVL3 (GOWN DISPOSABLE) ×6 IMPLANT
KIT BASIN OR (CUSTOM PROCEDURE TRAY) ×2 IMPLANT
PACK LITHOTOMY IV (CUSTOM PROCEDURE TRAY) ×2 IMPLANT
SWAB CULTURE ESWAB REG 1ML (MISCELLANEOUS) ×2 IMPLANT
TOWEL OR 17X26 10 PK STRL BLUE (TOWEL DISPOSABLE) ×2 IMPLANT
YANKAUER SUCT BULB TIP 10FT TU (MISCELLANEOUS) ×2 IMPLANT

## 2018-06-16 NOTE — Anesthesia Procedure Notes (Signed)
Procedure Name: LMA Insertion Date/Time: 06/16/2018 6:36 PM Performed by: Vanessa Durhamochran, Refujio Haymer Glenn, CRNA Pre-anesthesia Checklist: Emergency Drugs available, Patient identified, Suction available and Patient being monitored Patient Re-evaluated:Patient Re-evaluated prior to induction Oxygen Delivery Method: Circle system utilized Preoxygenation: Pre-oxygenation with 100% oxygen Induction Type: IV induction Ventilation: Mask ventilation without difficulty LMA: LMA inserted LMA Size: 4.0 Number of attempts: 1 Placement Confirmation: positive ETCO2 and breath sounds checked- equal and bilateral Tube secured with: Tape Dental Injury: Teeth and Oropharynx as per pre-operative assessment

## 2018-06-16 NOTE — Interval H&P Note (Signed)
History and Physical Interval Note:  06/16/2018 6:13 PM I have seen and evaluated patient. He needs anesthesia to drain recurrent perirectal abscess.  Understands he likely needs to have eua when not infected.  Will do I and d today Rolena InfanteUrban Syler  has presented today for surgery, with the diagnosis of perirectal abcess  The various methods of treatment have been discussed with the patient and family. After consideration of risks, benefits and other options for treatment, the patient has consented to  Procedure(s): EXAM UNDER ANESTHESIA IRRIGATION AND DEBRIDEMENT PERIRECTAL ABSCESS (N/A) as a surgical intervention .  The patient's history has been reviewed, patient examined, no change in status, stable for surgery.  I have reviewed the patient's chart and labs.  Questions were answered to the patient's satisfaction.     Emelia LoronMatthew Shian Goodnow

## 2018-06-16 NOTE — ED Notes (Signed)
ED TO INPATIENT HANDOFF REPORT  Name/Age/Gender Victor Burns 27 y.o. male  Code Status    Code Status Orders  (From admission, onward)        Start     Ordered   06/16/18 0824  Full code  Continuous     06/16/18 0835    Code Status History    This patient has a current code status but no historical code status.      Home/SNF/Other Home  Chief Complaint Abscess  Level of Care/Admitting Diagnosis ED Disposition    ED Disposition Condition Comment   Admit  Hospital Area: [facility] [[id number]]  Level of Care: Med-Surg [16]  Diagnosis: Perirectal abscess [017510]  Admitting Physician: CCS, [place]  Attending Physician: CCS, MD [[id number]  PT Class (Do Not Modify): Observation [104]  PT Acc Code (Do Not Modify): Observation [10022]       Medical History Past Medical History:  Diagnosis Date  . Arthritis     Allergies No Known Allergies  IV Location/Drains/Wounds Patient Lines/Drains/Airways Status   Active Line/Drains/Airways    None          Labs/Imaging Results for orders placed or performed during the hospital encounter of 06/16/18 (from the past 48 hour(s))  Basic metabolic panel     Status: Abnormal   Collection Time: 06/16/18  4:28 AM  Result Value Ref Range   Sodium 135 135 - 145 mmol/L   Potassium 2.9 (L) 3.5 - 5.1 mmol/L   Chloride 101 98 - 111 mmol/L    Comment: Please note change in reference range.   CO2 21 (L) 22 - 32 mmol/L   Glucose, Bld 106 (H) 70 - 99 mg/dL    Comment: Please note change in reference range.   BUN 12 6 - 20 mg/dL    Comment: Please note change in reference range.   Creatinine, Ser 1.06 0.61 - 1.24 mg/dL   Calcium 9.5 8.9 - 10.3 mg/dL   GFR calc non Af Amer >60 >60 mL/min   GFR calc Af Amer >60 >60 mL/min    Comment: (NOTE) The eGFR has been calculated using the CKD EPI equation. This calculation has not been validated in all clinical situations. eGFR's persistently <60 mL/min signify  possible Chronic Kidney Disease.    Anion gap 13 5 - 15    Comment: Performed at [facility], [place] [address] [place], [place] [address]  CBC with Differential     Status: Abnormal   Collection Time: 06/16/18  4:28 AM  Result Value Ref Range   WBC 11.2 (H) 4.0 - 10.5 K/uL   RBC 4.30 4.22 - 5.81 MIL/uL   Hemoglobin 12.9 (L) 13.0 - 17.0 g/dL   HCT 37.0 (L) 39.0 - 52.0 %   MCV 86.0 78.0 - 100.0 fL   MCH 30.0 26.0 - 34.0 pg   MCHC 34.9 30.0 - 36.0 g/dL   RDW 12.8 11.5 - 15.5 %   Platelets 319 150 - 400 K/uL   Neutrophils Relative % 76 %   Neutro Abs 8.5 (H) 1.7 - 7.7 K/uL   Lymphocytes Relative 13 %   Lymphs Abs 1.4 0.7 - 4.0 K/uL   Monocytes Relative 11 %   Monocytes Absolute 1.2 (H) 0.1 - 1.0 K/uL   Eosinophils Relative 0 %   Eosinophils Absolute 0.0 0.0 - 0.7 K/uL   Basophils Relative 0 %   Basophils Absolute 0.0 0.0 - 0.1 K/uL    Comment: Performed at  [facility], [place] [address], [place], [place] [address]   Ct Abdomen Pelvis W Contrast  Result Date: 06/16/2018 CLINICAL DATA:  27 year old male with perirectal abscess. EXAM: CT ABDOMEN AND PELVIS WITH CONTRAST TECHNIQUE: Multidetector CT imaging of the abdomen and pelvis was performed using the standard protocol following bolus administration of intravenous contrast. CONTRAST:  <See Chart> ISOVUE-300 IOPAMIDOL (ISOVUE-300) INJECTION 61% COMPARISON:  None. FINDINGS: Lower chest: The visualized lung bases are clear. No intra-abdominal free air or free fluid. Hepatobiliary: No focal liver abnormality is seen. No gallstones, gallbladder wall thickening, or biliary dilatation. Pancreas: Unremarkable. No pancreatic ductal dilatation or surrounding inflammatory changes. Spleen: Normal in size without focal abnormality. Adrenals/Urinary Tract: The adrenal glands are unremarkable. There is a 2 mm nonobstructing right renal upper pole calculus. Areas of linear hypodensity in the upper pole of the right  kidney and inferior pole of the left kidney noted which may represent scarring. There is no hydronephrosis on either side. The visualized ureters and urinary bladder appear unremarkable. Stomach/Bowel: There is no bowel obstruction or active inflammation. The appendix is normal. Vascular/Lymphatic: No significant vascular findings are present. No enlarged abdominal or pelvic lymph nodes. Reproductive: The prostate and seminal vesicles are grossly unremarkable. No pelvic mass. There is a 1.0 x 1.5 cm hypodense lesion in the left scrotum (series 2, image 95 and series 9, image 3) which is not well characterized but may represent an epididymal or testicular cyst. Further evaluation with testicular ultrasound recommended. Other: There is a 3.0 x 8.3 cm fluid collection containing pockets of air in the subcutaneous soft tissues of the medial left upper thigh most consistent with an abscess. There is associated stranding of the surrounding subcutaneous fat. Musculoskeletal: No acute or significant osseous findings. IMPRESSION: 1. Complex fluid collection/abscess in the subcutaneous soft tissues of the medial left upper thigh. 2. A small hypodense lesion in the left scrotum, indeterminate, possibly a testicular or epididymal cyst. Further evaluation with testicular ultrasound recommended. 3. A 2 mm nonobstructing right renal upper pole calculus. No hydronephrosis. Electronically Signed   By: Anner Crete M.D.   On: 06/16/2018 06:31   US Scrotum W/doppler  Result Date: 06/16/2018 CLINICAL DATA:  Questionable scrotal lesion on CT examination. EXAM: SCROTAL ULTRASOUND DOPPLER ULTRASOUND OF THE TESTICLES TECHNIQUE: Complete ultrasound examination of the testicles, epididymis, and other scrotal structures was performed. Color and spectral Doppler ultrasound were also utilized to evaluate blood flow to the testicles. COMPARISON:  CT abdomen and pelvis including upper thigh regions June 16, 2018 FINDINGS: Right testicle  Measurements: 4.5 x 2.1 x 2.9 cm. No mass or microlithiasis visualized. Left testicle Measurements: 4.1 x 2.2 x 2.5 cm. No mass or microlithiasis visualized. Right epididymis: Normal in size and appearance. No inflammatory focus evident. Left epididymis: There is a simple appearing cyst in the head of the epididymis on the left measuring 1.6 x 1.2 x 0.8 cm. No other extratesticular lesions seen on the left. No inflammatory focus. Hydrocele:  None visualized. Varicocele:  None visualized. Pulsed Doppler interrogation of both testes demonstrates normal low resistance arterial and venous waveforms bilaterally. No appreciable scrotal wall thickening or abscess within the scrotal sac region. IMPRESSION: Epididymal head cyst measuring 1.6 x 1.2 x 0.8 cm. This cyst corresponds to the lesion seen recent CT. Study otherwise unremarkable. No intratesticular mass or torsion on either side. No evident epididymitis. No appreciable hydroceles. Electronically Signed   By: Lowella Grip III M.D.   On: 06/16/2018 08:19  Pending Labs Unresulted Labs (From admission, onward)   Start     Ordered   06/17/18 0160  Basic metabolic panel  Tomorrow morning,   R     06/16/18 0835   06/17/18 0500  CBC  Tomorrow morning,   R     06/16/18 0835   06/16/18 0928  Protime-INR  Once,   R     06/16/18 0928   06/16/18 0831  Urinalysis, Complete w Microscopic  Once,   R     06/16/18 0835   06/16/18 0831  Urine culture  Once,   R     06/16/18 0835   06/16/18 0831  Magnesium  Add-on,   R     06/16/18 0835   06/16/18 0830  Hepatic function panel  Add-on,   R     06/16/18 0835   06/16/18 0824  HIV antibody (Routine Testing)  Once,   R     06/16/18 0835      Vitals/Pain Today's Vitals   06/16/18 0634 06/16/18 0634 06/16/18 0818 06/16/18 0830  BP: 136/82   133/89  Pulse: 98   84  Resp: 15     Temp:      SpO2: 99%   100%  PainSc:  7  7      Isolation Precautions No active isolations  Medications Medications   dextrose 5 % and 0.9 % NaCl with KCl 40 mEq/L infusion (has no administration in time range)  piperacillin-tazobactam (ZOSYN) IVPB 3.375 g (has no administration in time range)  morphine 2 MG/ML injection 2-3 mg (3 mg Intravenous Given 06/16/18 0930)  ketorolac (TORADOL) 30 MG/ML injection 30 mg (has no administration in time range)  diphenhydrAMINE (BENADRYL) 12.5 MG/5ML elixir 12.5 mg (has no administration in time range)    Or  diphenhydrAMINE (BENADRYL) injection 12.5 mg (has no administration in time range)  ondansetron (ZOFRAN-ODT) disintegrating tablet 4 mg (has no administration in time range)    Or  ondansetron (ZOFRAN) injection 4 mg (has no administration in time range)  nicotine (NICODERM CQ - dosed in mg/24 hr) patch 7 mg (has no administration in time range)  potassium chloride SA (K-DUR,KLOR-CON) CR tablet 20 mEq (has no administration in time range)  LORazepam (ATIVAN) tablet 1 mg (has no administration in time range)    Or  LORazepam (ATIVAN) injection 1 mg (has no administration in time range)  thiamine (VITAMIN B-1) tablet 100 mg (has no administration in time range)    Or  thiamine (B-1) injection 100 mg (has no administration in time range)  folic acid (FOLVITE) tablet 1 mg (has no administration in time range)  multivitamin with minerals tablet 1 tablet (has no administration in time range)  LORazepam (ATIVAN) injection 0-4 mg (has no administration in time range)    Followed by  LORazepam (ATIVAN) injection 0-4 mg (has no administration in time range)  sodium chloride 0.9 % bolus 1,000 mL (0 mLs Intravenous Stopped 06/16/18 0600)  morphine 4 MG/ML injection 4 mg (4 mg Intravenous Given 06/16/18 0424)  ondansetron (ZOFRAN) injection 4 mg (4 mg Intravenous Given 06/16/18 0423)  potassium chloride 10 mEq in 100 mL IVPB (0 mEq Intravenous Stopped 06/16/18 0743)  potassium chloride SA (K-DUR,KLOR-CON) CR tablet 40 mEq (40 mEq Oral Given 06/16/18 0634)  iopamidol (ISOVUE-300)  61 % injection 100 mL (100 mLs Intravenous Contrast Given 06/16/18 0605)  HYDROmorphone (DILAUDID) injection 1 mg (1 mg Intravenous Given 06/16/18 0600)  Tdap (BOOSTRIX) injection 0.5 mL (0.5 mLs Intramuscular  Given 06/16/18 0807)  HYDROmorphone (DILAUDID) injection 1 mg (1 mg Intravenous Given 06/16/18 0743)    Mobility walks

## 2018-06-16 NOTE — Transfer of Care (Signed)
Immediate Anesthesia Transfer of Care Note  Patient: Victor InfanteUrban Burns  Procedure(s) Performed: EXAM UNDER ANESTHESIA IRRIGATION AND DEBRIDEMENT PERIRECTAL ABSCESS (N/A )  Patient Location: PACU  Anesthesia Type:General  Level of Consciousness: awake, alert , oriented and patient cooperative  Airway & Oxygen Therapy: Patient Spontanous Breathing and Patient connected to face mask oxygen  Post-op Assessment: Report given to RN, Post -op Vital signs reviewed and stable and Patient moving all extremities X 4  Post vital signs: stable  Last Vitals:  Vitals Value Taken Time  BP 139/82 06/16/2018  7:29 PM  Temp    Pulse 108 06/16/2018  7:34 PM  Resp 15 06/16/2018  7:34 PM  SpO2 100 % 06/16/2018  7:34 PM  Vitals shown include unvalidated device data.  Last Pain:  Vitals:   06/16/18 1631  TempSrc:   PainSc: Asleep      Patients Stated Pain Goal: 3 (06/16/18 1606)  Complications: No apparent anesthesia complications

## 2018-06-16 NOTE — ED Provider Notes (Signed)
[facility]-EMERGENCY DEPT Provider Note   CSN: [id number] Arrival date & time: 06/16/18  0210     History   Chief Complaint Chief Complaint  Patient presents with  . Abscess    HPI Victor Burns is a 27 y.o. male.  HPI   Victor Burns is a 27 y.o. male, with a history of arthritis, presenting to the ED with suspected abscess to the perianal and left buttock regions.  Pain began approximately 4 days ago and has worsened since onset.  Pain is sharp, severe, radiating into the rectum. States he had an abscess in the same location patient drained via general surgery about 3 months ago.  Denies fever/chills, abdominal pain, scrotal pain or swelling, N/V, drainage, or any other complaints.    Past Medical History:  Diagnosis Date  . Arthritis     There are no active problems to display for this patient.   History reviewed. No pertinent surgical history.      Home Medications    Prior to Admission medications   Not on File    Family History No family history on file.  Social History Social History   Tobacco Use  . Smoking status: Not on file  Substance Use Topics  . Alcohol use: Not Currently  . Drug use: Not Currently     Allergies   Patient has no known allergies.   Review of Systems Review of Systems  Constitutional: Negative for chills and fever.  Gastrointestinal: Negative for abdominal pain, diarrhea, nausea and vomiting.  Skin: Positive for color change.       Abscess to the left buttocks with pain into the rectum  All other systems reviewed and are negative.    Physical Exam Updated Vital Signs BP (!) 126/93 (BP Location: Right Arm)   Pulse (!) 126   Temp 98.5 F (36.9 C)   Resp 18   SpO2 95%   Physical Exam  Constitutional: He appears well-developed and well-nourished. No distress.  HENT:  Head: Normocephalic and atraumatic.  Eyes: Conjunctivae are normal.  Neck: Neck supple.  Cardiovascular: Normal  rate, regular rhythm, normal heart sounds and intact distal pulses.  Pulmonary/Chest: Effort normal and breath sounds normal. No respiratory distress.  Abdominal: Soft. There is no tenderness. There is no guarding.  Genitourinary:     Genitourinary Comments: Exquisite tenderness, erythema, induration, increased warmth to the areas as shown. No noted abnormalities extending to the scrotum. RN, Jeanice LimHolly, served as Biomedical engineerchaperone during exam.  Musculoskeletal: He exhibits no edema.  Lymphadenopathy:    He has no cervical adenopathy.  Neurological: He is alert.  Skin: Skin is warm and dry. He is not diaphoretic.  Psychiatric: He has a normal mood and affect. His behavior is normal.  Nursing note and vitals reviewed.    ED Treatments / Results  Labs (all labs ordered are listed, but only abnormal results are displayed) Labs Reviewed  BASIC METABOLIC PANEL - Abnormal; Notable for the following components:      Result Value   Potassium 2.9 (*)    CO2 21 (*)    Glucose, Bld 106 (*)    All other components within normal limits  CBC WITH DIFFERENTIAL/PLATELET - Abnormal; Notable for the following components:   WBC 11.2 (*)    Hemoglobin 12.9 (*)    HCT 37.0 (*)    Neutro Abs 8.5 (*)    Monocytes Absolute 1.2 (*)    All other components within normal limits    EKG None  Radiology Ct Abdomen Pelvis W Contrast  Result Date: 06/16/2018 CLINICAL DATA:  27 year old male with perirectal abscess. EXAM: CT ABDOMEN AND PELVIS WITH CONTRAST TECHNIQUE: Multidetector CT imaging of the abdomen and pelvis was performed using the standard protocol following bolus administration of intravenous contrast. CONTRAST:  <See Chart> ISOVUE-300 IOPAMIDOL (ISOVUE-300) INJECTION 61% COMPARISON:  None. FINDINGS: Lower chest: The visualized lung bases are clear. No intra-abdominal free air or free fluid. Hepatobiliary: No focal liver abnormality is seen. No gallstones, gallbladder wall thickening, or biliary  dilatation. Pancreas: Unremarkable. No pancreatic ductal dilatation or surrounding inflammatory changes. Spleen: Normal in size without focal abnormality. Adrenals/Urinary Tract: The adrenal glands are unremarkable. There is a 2 mm nonobstructing right renal upper pole calculus. Areas of linear hypodensity in the upper pole of the right kidney and inferior pole of the left kidney noted which may represent scarring. There is no hydronephrosis on either side. The visualized ureters and urinary bladder appear unremarkable. Stomach/Bowel: There is no bowel obstruction or active inflammation. The appendix is normal. Vascular/Lymphatic: No significant vascular findings are present. No enlarged abdominal or pelvic lymph nodes. Reproductive: The prostate and seminal vesicles are grossly unremarkable. No pelvic mass. There is a 1.0 x 1.5 cm hypodense lesion in the left scrotum (series 2, image 95 and series 9, image 3) which is not well characterized but may represent an epididymal or testicular cyst. Further evaluation with testicular ultrasound recommended. Other: There is a 3.0 x 8.3 cm fluid collection containing pockets of air in the subcutaneous soft tissues of the medial left upper thigh most consistent with an abscess. There is associated stranding of the surrounding subcutaneous fat. Musculoskeletal: No acute or significant osseous findings. IMPRESSION: 1. Complex fluid collection/abscess in the subcutaneous soft tissues of the medial left upper thigh. 2. A small hypodense lesion in the left scrotum, indeterminate, possibly a testicular or epididymal cyst. Further evaluation with testicular ultrasound recommended. 3. A 2 mm nonobstructing right renal upper pole calculus. No hydronephrosis. Electronically Signed   By: Elgie Collard M.D.   On: 06/16/2018 06:31    Procedures Procedures (including critical care time)  Medications Ordered in ED Medications  potassium chloride 10 mEq in 100 mL IVPB (10 mEq  Intravenous New Bag/Given 06/16/18 0634)  Tdap (BOOSTRIX) injection 0.5 mL (has no administration in time range)  sodium chloride 0.9 % bolus 1,000 mL (0 mLs Intravenous Stopped 06/16/18 0600)  morphine 4 MG/ML injection 4 mg (4 mg Intravenous Given 06/16/18 0424)  ondansetron (ZOFRAN) injection 4 mg (4 mg Intravenous Given 06/16/18 0423)  potassium chloride SA (K-DUR,KLOR-CON) CR tablet 40 mEq (40 mEq Oral Given 06/16/18 0634)  iopamidol (ISOVUE-300) 61 % injection 100 mL (100 mLs Intravenous Contrast Given 06/16/18 0605)  HYDROmorphone (DILAUDID) injection 1 mg (1 mg Intravenous Given 06/16/18 0600)     Initial Impression / Assessment and Plan / ED Course  I have reviewed the triage vital signs and the nursing notes.  Pertinent labs & imaging results that were available during my care of the patient were reviewed by me and considered in my medical decision making (see chart for details).  Clinical Course as of Jun 17 711  Fri Jun 16, 2018  0659 Spoke with Dr. Magnus Ivan, general surgeon. States someone from the day shift team will handle this patient.   [SJ]    Clinical Course User Index [SJ] Saskia Simerson C, PA-C    Patient presents with left buttock pain.  Suspicion for large abscess with concern for involvement of  the rectum.  Afebrile.  Initially tachycardic.  Leukocytosis of 11.2.  Hypokalemia at 2.9; patient not currently symptomatic to this. Large 8x3 cm abscess noted on CT.  Findings and plan of care discussed with Dr. Read Drivers.   End of shift patient care handoff report given to Glenford Bayley, PA-C. Plan: Surgery to see patient. Scrotal US pending.     Final Clinical Impressions(s) / ED Diagnoses   Final diagnoses:  Abscess of buttock, left  Hypokalemia    ED Discharge Orders    None       Concepcion Living 06/16/18 0717    Molpus, Jonny Ruiz, MD 06/16/18 [phone]

## 2018-06-16 NOTE — Anesthesia Preprocedure Evaluation (Signed)
Anesthesia Evaluation  Patient identified by MRN, date of birth, ID band Patient awake    Reviewed: Allergy & Precautions, NPO status , Patient's Chart, lab work & pertinent test results  Airway Mallampati: I       Dental no notable dental hx. (+) Teeth Intact   Pulmonary Current Smoker,    Pulmonary exam normal breath sounds clear to auscultation       Cardiovascular negative cardio ROS Normal cardiovascular exam Rhythm:Regular Rate:Normal     Neuro/Psych Anxiety    GI/Hepatic negative GI ROS, Neg liver ROS,   Endo/Other  negative endocrine ROS  Renal/GU negative Renal ROS     Musculoskeletal   Abdominal Normal abdominal exam  (+)   Peds  Hematology  (+) anemia ,   Anesthesia Other Findings   Reproductive/Obstetrics                             Anesthesia Physical Anesthesia Plan  ASA: II  Anesthesia Plan: General   Post-op Pain Management:    Induction: Intravenous  PONV Risk Score and Plan: 2 and Ondansetron and Dexamethasone  Airway Management Planned: LMA  Additional Equipment:   Intra-op Plan:   Post-operative Plan: Extubation in OR  Informed Consent: I have reviewed the patients History and Physical, chart, labs and discussed the procedure including the risks, benefits and alternatives for the proposed anesthesia with the patient or authorized representative who has indicated his/her understanding and acceptance.   Dental advisory given  Plan Discussed with: CRNA and Surgeon  Anesthesia Plan Comments:         Anesthesia Quick Evaluation

## 2018-06-16 NOTE — ED Triage Notes (Addendum)
Pt from home via ems with abscess on buttocks since Monday. Pt denies fever. Pt denies drainage

## 2018-06-16 NOTE — Op Note (Signed)
Preoperative diagnosis: Perirectal abscess, recurrent Postoperative diagnosis: Same as above Procedure: Incision and drainage of recurrent perirectal abscess Surgeon: Dr. Harden MoMatt Gareth Burns Anesthesia: General Estimated blood loss: 10 cc Specimens: Cultures to microbiology Complications: None Drains: Penrose to abscess cavity Sponge needle count correct x2 at end of operation Disposition to recovery in stable condition  Indications: This is a 27 year old male who states he has had multiple prior perirectal abscesses in the same location.  He is never really had this evaluated between episodes.  He has a recurrent episode and is unable to be examined while awake.  We discussed exam under anesthesia and incision and drainage of perirectal abscess.  Procedure: After informed consent was obtained the patient was taken to the operating room.  He was given antibiotics.  SCDs were in place.  He was then placed under general anesthesia without complication.  He was placed in lithotomy position and appropriately padded.  He was prepped and draped in standard sterile surgical fashion.  A surgical timeout was then performed.  I located this area to the left of the rectum.  He had old scars located there as well.  I made an elliptical incision in a radial fashion overlying the abscess and there was a large amount of purulence that immediately returned.  I then took cultures.  I evacuated this entire area.  I removed the paddle of skin to leave a hold for drainage.  I irrigated this copiously.  I then placed 1/2 inch Penrose and secured this with a nylon suture and packed the cavity with iodoform gauze as well.  Dressings were placed.  He tolerated this well.  He was extubated and transferred to recovery stable.

## 2018-06-16 NOTE — H&P (Addendum)
Randel Sonnenberg is an 27 y.o. male.    PCP:  [facility] Reason for Consult:Perirectal abscess Referring Physician: J Molpus  Izel Glasscock is an 27 y.o. male.  HPI: 27 year old male who presented to the ED with a recurrent perirectal abscess.  He was seen and underwent incision and drainage same area 02/01/2018.  Patient was transported via EMS to the ED for evaluation this a.m. Pt reports this has occurred several times and says he was drained in the ED in the past also.  Pain started on Monday, he has been taking ibuprofen and doing sitz baths frequently at home.  Pain became so bad he came to the ED this AM.  You cannot really touch his left buttocks.  He does not want to move.  He says he was told in our office he might have a rectal fistula.  I see no other records of I&D's he reports here in the hospital.    On admission his blood pressure was 126/93 with a heart rate of 93, repeat blood pressure was 84/60.  He subsequently stabilized with fluids.  Labs show BMP is normal except for potassium of 2.9 and a glucose of 106.  WBC is 11.2, hemoglobin 12.9, hematocrit 37 platelets are 319,000.  CT scan today shows a complex fluid collection abscess in the subcutaneous soft tissue in the median left upper thigh.  There is a small hypodense lesion in the left scrotum intermediate possible testicular or epididymal minimal cyst.  There is also a 2 mm nonobstructing right renal calculus.  No hydronephrosis we are asked to see. Ultrasound of the scrotum she has an epididymal head cyst measuring 1.6 x 1.2 x 0.8 cm.  Study is otherwise unremarkable there is no intratesticular mass, torsion on either side no evident epididymitis no appreciable hydroceles.     Past Medical History:  Diagnosis Date  . Arthritis  Hx of seizure as a child - he cannot remember when he had one last  Asthma - He has not used inhaler in some time  Migraines - He has medicine but does not use it.  He has not had  headache for some time.       History reviewed. No pertinent surgical history.  I&D of left perirectal abscess  No family history on file. Social History:  reports that he drank alcohol. He reports that he has current or past drug history. His tobacco history is not on file. 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 Tobacco - Ongoing since age 53 ETOH:  Weekend binging - ~ 1/2 gal per weekend - no hx of withdrawal symptoms  Drugs - Marijuana/cocaine - no Cocaine for a couple weeks  Allergies: No Known Allergies  Prior to Admission medications   He reports no medicines He has something for Migraines, but does not use it     Results for orders placed or performed during the hospital encounter of 06/16/18 (from the past 48 hour(s))  Basic metabolic panel     Status: Abnormal   Collection Time: 06/16/18  4:28 AM  Result Value Ref Range   Sodium 135 135 - 145 mmol/L   Potassium 2.9 (L) 3.5 - 5.1 mmol/L   Chloride 101 98 - 111 mmol/L    Comment: Please note change in reference range.   CO2 21 (L) 22 - 32 mmol/L   Glucose, Bld 106 (H) 70 - 99 mg/dL    Comment: Please note change in reference  range.   BUN 12 6 - 20 mg/dL    Comment: Please note change in reference range.   Creatinine, Ser 1.06 0.61 - 1.24 mg/dL   Calcium 9.5 8.9 - 10.3 mg/dL   GFR calc non Af Amer >60 >60 mL/min   GFR calc Af Amer >60 >60 mL/min    Comment: (NOTE) The eGFR has been calculated using the CKD EPI equation. This calculation has not been validated in all clinical situations. eGFR's persistently <60 mL/min signify possible Chronic Kidney Disease.    Anion gap 13 5 - 15    Comment: Performed at [facility], [place] [address] [place], [place] [address]  CBC with Differential     Status: Abnormal   Collection Time: 06/16/18  4:28 AM  Result Value Ref Range   WBC 11.2 (H) 4.0 - 10.5 K/uL   RBC 4.30 4.22 - 5.81 MIL/uL   Hemoglobin 12.9 (L) 13.0 - 17.0 g/dL    HCT 37.0 (L) 39.0 - 52.0 %   MCV 86.0 78.0 - 100.0 fL   MCH 30.0 26.0 - 34.0 pg   MCHC 34.9 30.0 - 36.0 g/dL   RDW 12.8 11.5 - 15.5 %   Platelets 319 150 - 400 K/uL   Neutrophils Relative % 76 %   Neutro Abs 8.5 (H) 1.7 - 7.7 K/uL   Lymphocytes Relative 13 %   Lymphs Abs 1.4 0.7 - 4.0 K/uL   Monocytes Relative 11 %   Monocytes Absolute 1.2 (H) 0.1 - 1.0 K/uL   Eosinophils Relative 0 %   Eosinophils Absolute 0.0 0.0 - 0.7 K/uL   Basophils Relative 0 %   Basophils Absolute 0.0 0.0 - 0.1 K/uL    Comment: Performed at [facility], [place] [address], [place], [place] [address]   Ct Abdomen Pelvis W Contrast  Result Date: 06/16/2018 CLINICAL DATA:  27 year old male with perirectal abscess. EXAM: CT ABDOMEN AND PELVIS WITH CONTRAST TECHNIQUE: Multidetector CT imaging of the abdomen and pelvis was performed using the standard protocol following bolus administration of intravenous contrast. CONTRAST:  <See Chart> ISOVUE-300 IOPAMIDOL (ISOVUE-300) INJECTION 61% COMPARISON:  None. FINDINGS: Lower chest: The visualized lung bases are clear. No intra-abdominal free air or free fluid. Hepatobiliary: No focal liver abnormality is seen. No gallstones, gallbladder wall thickening, or biliary dilatation. Pancreas: Unremarkable. No pancreatic ductal dilatation or surrounding inflammatory changes. Spleen: Normal in size without focal abnormality. Adrenals/Urinary Tract: The adrenal glands are unremarkable. There is a 2 mm nonobstructing right renal upper pole calculus. Areas of linear hypodensity in the upper pole of the right kidney and inferior pole of the left kidney noted which may represent scarring. There is no hydronephrosis on either side. The visualized ureters and urinary bladder appear unremarkable. Stomach/Bowel: There is no bowel obstruction or active inflammation. The appendix is normal. Vascular/Lymphatic: No significant vascular findings are present. No enlarged abdominal or  pelvic lymph nodes. Reproductive: The prostate and seminal vesicles are grossly unremarkable. No pelvic mass. There is a 1.0 x 1.5 cm hypodense lesion in the left scrotum (series 2, image 95 and series 9, image 3) which is not well characterized but may represent an epididymal or testicular cyst. Further evaluation with testicular ultrasound recommended. Other: There is a 3.0 x 8.3 cm fluid collection containing pockets of air in the subcutaneous soft tissues of the medial left upper thigh most consistent with an abscess. There is associated stranding of the surrounding subcutaneous fat. Musculoskeletal: No acute or significant osseous findings.  IMPRESSION: 1. Complex fluid collection/abscess in the subcutaneous soft tissues of the medial left upper thigh. 2. A small hypodense lesion in the left scrotum, indeterminate, possibly a testicular or epididymal cyst. Further evaluation with testicular ultrasound recommended. 3. A 2 mm nonobstructing right renal upper pole calculus. No hydronephrosis. Electronically Signed   By: Anner Crete M.D.   On: 06/16/2018 06:31    Review of Systems  Constitutional: Negative.   HENT: Negative.   Eyes: Negative.   Respiratory: Negative.   Cardiovascular: Positive for chest pain (occasional with exertion). Negative for palpitations, orthopnea, claudication, leg swelling and PND.  Gastrointestinal: Negative.   Genitourinary: Negative for dysuria, flank pain, frequency, hematuria and urgency.       Felt relief with voiding  Musculoskeletal: Negative.   Skin: Negative.   Neurological: Negative.   Endo/Heme/Allergies: Negative.   Psychiatric/Behavioral: The patient is nervous/anxious.     Blood pressure 136/82, pulse 98, temperature 98.5 F (36.9 C), resp. rate 15, SpO2 99 %. Physical Exam  Constitutional: He is oriented to person, place, and time. He appears well-developed and well-nourished. No distress.  Very anxious, won't let us touch or examine site  secondary to pain.  No drainage noted, some swelling evident on exam left side.  He won't turn over to side secondary to pain.  HENT:  Head: Normocephalic and atraumatic.  Mouth/Throat: Oropharynx is clear and moist. No oropharyngeal exudate.  Eyes: Right eye exhibits no discharge. Left eye exhibits no discharge. No scleral icterus.  Pupils are equal  Neck: Normal range of motion. Neck supple. No JVD present. No tracheal deviation present. No thyromegaly present.  Cardiovascular: Normal rate, regular rhythm, normal heart sounds and intact distal pulses.  No murmur heard. Respiratory: Effort normal and breath sounds normal. No respiratory distress. He has no wheezes. He has no rales. He exhibits no tenderness.  GI: Soft. Bowel sounds are normal. He exhibits no distension and no mass. There is no tenderness. There is no rebound and no guarding.  Genitourinary:  Genitourinary Comments: He just completed US of the testicles,  Left side of the rectum is tender and swollen  Musculoskeletal: He exhibits no edema or tenderness.  Lymphadenopathy:    He has no cervical adenopathy.  Neurological: He is alert and oriented to person, place, and time. No cranial nerve deficit.  Skin: Skin is warm and dry. No rash noted. He is not diaphoretic. No erythema. No pallor.  Psychiatric: He has a normal mood and affect. His behavior is normal. Judgment and thought content normal.     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  Exam under anesthesia, and I&D of left perirectal abscess.  With hx of heavy ETOH/some drug use, I will put him on the Rogers protocol.     Terrel Manalo, PA-C 06/16/2018, 8:02 AM

## 2018-06-16 NOTE — Progress Notes (Signed)
Patient taken down for his procedure/surgery. Lina SarBeth Aspen Lawrance, RN

## 2018-06-17 LAB — BASIC METABOLIC PANEL
Anion gap: 6 (ref 5–15)
BUN: 10 mg/dL (ref 6–20)
CALCIUM: 9 mg/dL (ref 8.9–10.3)
CO2: 25 mmol/L (ref 22–32)
CREATININE: 0.84 mg/dL (ref 0.61–1.24)
Chloride: 106 mmol/L (ref 98–111)
GFR calc non Af Amer: >60 mL/min (ref >60)
Glucose, Bld: 144 mg/dL — ABNORMAL HIGH (ref 70–99)
Potassium: 4.4 mmol/L (ref 3.5–5.1)
Sodium: 137 mmol/L (ref 135–145)

## 2018-06-17 LAB — CBC
HCT: 34.3 % — ABNORMAL LOW (ref 39.0–52.0)
Hemoglobin: 11.6 g/dL — ABNORMAL LOW (ref 13.0–17.0)
MCH: 29.9 pg (ref 26.0–34.0)
MCHC: 33.8 g/dL (ref 30.0–36.0)
MCV: 88.4 fL (ref 78.0–100.0)
PLATELETS: 332 10*3/uL (ref 150–400)
RBC: 3.88 MIL/uL — AB (ref 4.22–5.81)
RDW: 13.2 % (ref 11.5–15.5)
WBC: 12.4 10*3/uL — ABNORMAL HIGH (ref 4.0–10.5)

## 2018-06-17 LAB — URINE CULTURE: CULTURE: NO GROWTH

## 2018-06-17 LAB — HIV ANTIBODY (ROUTINE TESTING W REFLEX): HIV Screen 4th Generation wRfx: NONREACTIVE

## 2018-06-17 MED ORDER — AMOXICILLIN-POT CLAVULANATE 875-125 MG PO TABS: 1.0000 | ORAL_TABLET | Freq: Two times a day (BID) | ORAL | 0 refills | Status: DC

## 2018-06-17 MED ORDER — OXYCODONE-ACETAMINOPHEN 10-325 MG PO TABS: 1.0000 | ORAL_TABLET | Freq: Four times a day (QID) | ORAL | 0 refills | Status: AC | PRN

## 2018-06-17 NOTE — Discharge Summary (Signed)
Physician Discharge Summary  Patient ID: Victor InfanteUrban Kumpf MRN: [id number] DOB/AGE: 26/07/1991 26 y.o.  Admit date: 06/16/2018 Discharge date: 06/17/2018  Admission Diagnoses: Recurrent perirectal abscess  Discharge Diagnoses:  Principal Problem:   Perirectal abscess Active Problems:   Alcohol abuse   Discharged Condition: good  Hospital Course: [age] yom s/p I and d of recurrent perirectal abscess.  Removed packing today will dc home with penrose. Feels much better.  Discussed need for follow up for elective eua for likely fistula  Consults: None  Significant Diagnostic Studies: none  Treatments: surgery: I and d perirectal absces   Discharge Exam: Blood pressure 106/62, pulse (!) 53, temperature (!) 97.5 F (36.4 C), temperature source Oral, resp. rate 17, SpO2 99 %. Incision/Wound:clean with no erythema, minimal drainage  Disposition: Discharge disposition: 01-Home or Self Care        Allergies as of 06/17/2018   No Known Allergies     Medication List    TAKE these medications   amoxicillin-clavulanate 875-125 MG tablet Commonly known as:  AUGMENTIN Take 1 tablet by mouth 2 (two) times daily.   oxyCODONE-acetaminophen 10-325 MG tablet Commonly known as:  PERCOCET Take 1 tablet by mouth every 6 (six) hours as needed for pain.      Follow-up Information    [facility] In 1 week.   Specialty:  General Surgery Contact information: [address] [place] [id number] [phone]          Signed: Emelia LoronMatthew Tudor Chandley 06/17/2018, 10:51 AM

## 2018-06-17 NOTE — Progress Notes (Signed)
After shower, pt c/o vomiting episode. No vomitus noted or observed. Mild nausea remains. Sublingual Zofran given. Reassurance given. Reinforced to call MD after dc if NV recurs and becomes persistent with verbalized understanding.

## 2018-06-17 NOTE — Progress Notes (Signed)
Pt ambulatory to bathroom without difficulty without assistance.  Pt instructed to call for help if needed. Pt verbalized understanding.

## 2018-06-17 NOTE — Progress Notes (Signed)
Assessment unchanged. Pt and mother verbalized understanding of dc instructions through teach back including follow up care, when to call the doctor, as well as medications to resume. Pt showered prior to dc. Both mom and pt educated regarding penrose drain and redressing area PRN. Gauze, abd pads, and a sitz bath provided for home. Script x 2 given per MD. Nausea subsiding. Discharged via wc to front entrance accompanied by NT and mother.

## 2018-06-17 NOTE — Progress Notes (Signed)
Pt ambulatory to bathroom without difficulty without assistance.   

## 2018-06-17 NOTE — Discharge Instructions (Signed)
Incision and Drainage, Care After Refer to this sheet in the next few weeks. These instructions provide you with information about caring for yourself after your procedure. Your health care provider may also give you more specific instructions. Your treatment has been planned according to current medical practices, but problems sometimes occur. Call your health care provider if you have any problems or questions after your procedure. What can I expect after the procedure? After the procedure, it is common to have:  Pain or discomfort around your incision site.  Drainage from your incision.  Follow these instructions at home:  Take over-the-counter and prescription medicines only as told by your health care provider.  If you were prescribed an antibiotic medicine, take it as told by your health care provider.Do not stop taking the antibiotic even if you start to feel better.  Followinstructions from your health care provider about: ? How to take care of your incision. ? When and how you should change your packing and bandage (dressing). Wash your hands with soap and water before you change your dressing. If soap and water are not available, use hand sanitizer. ? When you should remove your dressing.  Do not take baths, swim, or use a hot tub until your health care provider approves.  Keep all follow-up visits as told by your health care provider. This is important.  Check your incision area every day for signs of infection. Check for: ? More redness, swelling, or pain. ? More fluid or blood. ? Warmth. ? Pus or a bad smell. Contact a health care provider if:  Your cyst or abscess returns.  You have a fever.  You have more redness, swelling, or pain around your incision.  You have more fluid or blood coming from your incision.  Your incision feels warm to the touch.  You have pus or a bad smell coming from your incision. Get help right away if:  You have severe pain or  bleeding.  You cannot eat or drink without vomiting.  You have decreased urine output.  You become short of breath.  You have chest pain.  You cough up blood.  The area where the incision and drainage occurred becomes numb or it tingles. This information is not intended to replace advice given to you by your health care provider. Make sure you discuss any questions you have with your health care provider. Document Released: 02/14/2012 Document Revised: 04/23/2016 Document Reviewed: 09/12/2015 Elsevier Interactive Patient Education  2018 Elsevier Inc[employer]  

## 2018-06-18 NOTE — Anesthesia Postprocedure Evaluation (Addendum)
Anesthesia Post Note  Patient: Victor InfanteUrban Fulwider  Procedure(s) Performed: EXAM UNDER ANESTHESIA IRRIGATION AND DEBRIDEMENT PERIRECTAL ABSCESS (N/A )     Patient location during evaluation: PACU Anesthesia Type: General Level of consciousness: sedated Pain management: pain level controlled Vital Signs Assessment: post-procedure vital signs reviewed and stable Respiratory status: spontaneous breathing Cardiovascular status: stable Postop Assessment: no apparent nausea or vomiting Anesthetic complications: no    Last Vitals:  Vitals:   06/16/18 2301 06/17/18 0422  BP: 120/69 106/62  Pulse: (!) 56 (!) 53  Resp: 16 17  Temp: 36.6 C (!) 36.4 C  SpO2: 100% 99%    Last Pain:  Vitals:   06/17/18 1400  TempSrc:   PainSc: 4    Pain Goal: Patients Stated Pain Goal: 2 (06/17/18 0256)               Pritesh Sobecki JR,JOHN Susann GivensFRANKLIN

## 2018-06-19 ENCOUNTER — Encounter (HOSPITAL_COMMUNITY): Payer: Self-pay | Admitting: Emergency Medicine

## 2018-06-19 ENCOUNTER — Encounter (HOSPITAL_COMMUNITY): Payer: Self-pay | Admitting: General Surgery

## 2018-06-22 LAB — AEROBIC/ANAEROBIC CULTURE (SURGICAL/DEEP WOUND)

## 2018-06-22 LAB — AEROBIC/ANAEROBIC CULTURE W GRAM STAIN (SURGICAL/DEEP WOUND)

## 2018-07-17 ENCOUNTER — Ambulatory Visit (INDEPENDENT_AMBULATORY_CARE_PROVIDER_SITE_OTHER): Payer: Managed Care, Other (non HMO)

## 2018-07-17 ENCOUNTER — Encounter (HOSPITAL_COMMUNITY): Payer: Self-pay | Admitting: Emergency Medicine

## 2018-07-17 ENCOUNTER — Ambulatory Visit (HOSPITAL_COMMUNITY)
Admission: EM | Admit: 2018-07-17 | Discharge: 2018-07-17 | Disposition: A | Discharge status: Home / Self Care | Payer: Managed Care, Other (non HMO) | Attending: Family Medicine | Admitting: Family Medicine

## 2018-07-17 DIAGNOSIS — M25562 Pain in left knee: Secondary | ICD-10-CM | POA: Diagnosis not present

## 2018-07-17 DIAGNOSIS — M25462 Effusion, left knee: Secondary | ICD-10-CM

## 2018-07-17 NOTE — ED Provider Notes (Signed)
[facility]    CSN: [id number] Arrival date & time: 07/17/18  1749     History   Chief Complaint Chief Complaint  Patient presents with  . Knee Pain    HPI Victor Burns is a 27 y.o. male.   HPI  Patient is here for left knee pain.  He states he had arthroscopic surgery on this left knee when he was about 27 years old.  After this was completely normal until this weekend.  He states that he was dancing late Saturday night when he twisted and fell.  The knee is been terribly painful ever since then.  He cannot comfortably bear weight.  He cannot fully extend or flex it.  He could not sleep last night because of the pain.  He is tried ice.  He had Percocet left over from his surgery a couple months back, and took 1 of these to sleep.  He states that when he puts full weight on the leg he feels as if it will give out.  He keeps his leg stiff in extension resisting any movement.  Past Medical History:  Diagnosis Date  . Abscess   . Arthritis   . Asthma     Patient Active Problem List   Diagnosis Date Noted  . Perirectal abscess s/p I&D 06/16/2018 06/16/2018  . Alcohol abuse 06/16/2018  . Mild persistent asthma 10/07/2017    Past Surgical History:  Procedure Laterality Date  . INCISION AND DRAINAGE PERIRECTAL ABSCESS N/A 06/16/2018   Procedure: EXAM UNDER ANESTHESIA IRRIGATION AND DEBRIDEMENT PERIRECTAL ABSCESS;  Surgeon: Emelia LoronWakefield, Matthew, MD;  Location: [facility];  Service: General;  Laterality: N/A;  . KNEE ARTHROSCOPY         Home Medications    Prior to Admission medications   Medication Sig Start Date End Date Taking? Authorizing Provider  albuterol (PROVENTIL HFA;VENTOLIN HFA) 108 (90 Base) MCG/ACT inhaler Inhale 2 puffs into the lungs every 4 (four) hours as needed for wheezing or shortness of breath. 05/16/18   McVey, Madelaine BhatElizabeth Whitney, PA-C  butalbital-acetaminophen-caffeine (FIORICET, ESGIC) [phone] MG tablet Take 1-2 tablets by mouth every 6 (six)  hours as needed for headache. 05/16/18 05/16/19  McVey, Madelaine BhatElizabeth Whitney, PA-C  ibuprofen (ADVIL,MOTRIN) 600 MG tablet Take 1 tablet (600 mg total) by mouth every 6 (six) hours as needed. 09/29/17   Maczis, Elmer SowMichael M, PA-C  oxyCODONE-acetaminophen (PERCOCET) 10-325 MG tablet Take 1 tablet by mouth every 6 (six) hours as needed for pain. 06/17/18 06/17/19  Emelia LoronWakefield, Matthew, MD    Family History History reviewed. No pertinent family history.  Social History Social History   Tobacco Use  . Smoking status: Current Every Day Smoker    Packs/day: 0.30    Types: Cigarettes  . Smokeless tobacco: Never Used  Substance Use Topics  . Alcohol use: Not Currently    Comment: at least a beer a day  . Drug use: Not Currently     Allergies   Bee venom and Shellfish allergy   Review of Systems Review of Systems  Constitutional: Negative for chills and fever.  HENT: Negative for ear pain and sore throat.   Eyes: Negative for pain and visual disturbance.  Respiratory: Negative for cough and shortness of breath.   Cardiovascular: Negative for chest pain and palpitations.  Gastrointestinal: Negative for abdominal pain and vomiting.  Genitourinary: Negative for dysuria and hematuria.  Musculoskeletal: Positive for arthralgias, gait problem and joint swelling. Negative for back pain.  Skin: Negative for color change and  rash.  Neurological: Negative for seizures and syncope.  All other systems reviewed and are negative.    Physical Exam Triage Vital Signs ED Triage Vitals [07/17/18 1905]  Enc Vitals Group     BP 140/84     Pulse Rate 65     Resp 18     Temp 98.2 F (36.8 C)     Temp Source Oral     SpO2 100 %     Weight      Height      Head Circumference      Peak Flow      Pain Score      Pain Loc      Pain Edu?      Excl. in GC?    No data found.  Updated Vital Signs BP 140/84 (BP Location: Right Arm)   Pulse 65   Temp 98.2 F (36.8 C) (Oral)   Resp 18   SpO2 100%    Visual Acuity Right Eye Distance:   Left Eye Distance:   Bilateral Distance:    Right Eye Near:   Left Eye Near:    Bilateral Near:     Physical Exam  Constitutional: He appears well-developed and well-nourished. No distress.  Appears uncomfortable.  HENT:  Head: Normocephalic and atraumatic.  Mouth/Throat: Oropharynx is clear and moist.  Eyes: Pupils are equal, round, and reactive to light. Conjunctivae are normal.  Neck: Normal range of motion.  Cardiovascular: Normal rate, regular rhythm and normal heart sounds.  Pulmonary/Chest: Effort normal and breath sounds normal. No respiratory distress.  Abdominal: Soft. He exhibits no distension.  Musculoskeletal: Normal range of motion. He exhibits no edema.  Patient holds left knee in extension.  There is no obvious large effusion.  There is tenderness to palpation medial greater than lateral joint line, and across the patella.  No instability testing performed due to his inability to move his knee.  No tenderness posterior knee, patellar tendon, or tibial tuberosity.  Neurological: He is alert.  Skin: Skin is warm and dry.     UC Treatments / Results  Labs (all labs ordered are listed, but only abnormal results are displayed) Labs Reviewed - No data to display  EKG None  Radiology Dg Knee Complete 4 Views Left  Result Date: 07/17/2018 CLINICAL DATA:  Twisting injury pain around the medial femur EXAM: LEFT KNEE - COMPLETE 4+ VIEW COMPARISON:  None. FINDINGS: Joint spaces are maintained. There is possible faint loose body in the subpatellar region on the lateral view. Suspect that there is knee effusion as well. Joint spaces are otherwise maintained. IMPRESSION: 18 mm suspected faint loose body in the subpatellar region, uncertain donor site. Possibility of osteochondral fracture/injury is raised. Further evaluation with MRI should be considered. Electronically Signed   By: Jasmine Pang M.D.   On: 07/17/2018 19:59     Procedures Procedures (including critical care time)  Medications Ordered in UC Medications - No data to display  Initial Impression / Assessment and Plan / UC Course  I have reviewed the triage vital signs and the nursing notes.  Pertinent labs & imaging results that were available during my care of the patient were reviewed by me and considered in my medical decision making (see chart for details).     Discussed with patient the x-ray findings of possible internal derangement.  I showed him  the x-rays with the possible retropatellar foreign body.  Discussed referral to an orthopedic.  Discussed this likely would require  another arthroscopic surgery. Final Clinical Impressions(s) / UC Diagnoses   Final diagnoses:  Acute pain of left knee  Effusion of left knee     Discharge Instructions     Use ice for 20 minutes every 2-4 hours Wear brace for support at all times that you are upright and walking Take ibuprofen 3 times a day with food.  This is to reduce pain and inflammation. If needed you may take oxycodone in addition to the ibuprofen. Follow-up with orthopedic in 1 to 2 weeks    ED Prescriptions    None     Controlled Substance Prescriptions [place] Controlled Substance Registry consulted? Not Applicable   Eustace MooreNelson, [employer] Sue, MD 07/17/18 2040

## 2018-07-17 NOTE — Discharge Instructions (Signed)
Use ice for 20 minutes every 2-4 hours Wear brace for support at all times that you are upright and walking Take ibuprofen 3 times a day with food.  This is to reduce pain and inflammation. If needed you may take oxycodone in addition to the ibuprofen. Follow-up with orthopedic in 1 to 2 weeks

## 2018-07-17 NOTE — ED Triage Notes (Signed)
Pt here for left knee pain after injuring while dancing on Saturday

## 2018-11-30 ENCOUNTER — Emergency Department (HOSPITAL_COMMUNITY)
Admission: EM | Admit: 2018-11-30 | Discharge: 2018-11-30 | Disposition: A | Discharge status: Home / Self Care | Payer: Self-pay | Attending: Emergency Medicine | Admitting: Emergency Medicine

## 2018-11-30 ENCOUNTER — Encounter (HOSPITAL_COMMUNITY): Payer: Self-pay

## 2018-11-30 ENCOUNTER — Emergency Department (HOSPITAL_COMMUNITY): Payer: Self-pay

## 2018-11-30 DIAGNOSIS — F17210 Nicotine dependence, cigarettes, uncomplicated: Secondary | ICD-10-CM | POA: Insufficient documentation

## 2018-11-30 DIAGNOSIS — Z79899 Other long term (current) drug therapy: Secondary | ICD-10-CM | POA: Insufficient documentation

## 2018-11-30 DIAGNOSIS — J45909 Unspecified asthma, uncomplicated: Secondary | ICD-10-CM | POA: Insufficient documentation

## 2018-11-30 DIAGNOSIS — M940 Chondrocostal junction syndrome [Tietze]: Secondary | ICD-10-CM | POA: Insufficient documentation

## 2018-11-30 LAB — POCT I-STAT TROPONIN I: Troponin i, poc: 0 ng/mL (ref 0.00–0.08)

## 2018-11-30 LAB — CBC
HCT: 40 % (ref 39.0–52.0)
HEMOGLOBIN: 12.8 g/dL — AB (ref 13.0–17.0)
MCH: 29.8 pg (ref 26.0–34.0)
MCHC: 32 g/dL (ref 30.0–36.0)
MCV: 93 fL (ref 80.0–100.0)
Platelets: 291 10*3/uL (ref 150–400)
RBC: 4.3 MIL/uL (ref 4.22–5.81)
RDW: 13.4 % (ref 11.5–15.5)
WBC: 6 10*3/uL (ref 4.0–10.5)
nRBC: 0 % (ref 0.0–0.2)

## 2018-11-30 LAB — BASIC METABOLIC PANEL
ANION GAP: 10 (ref 5–15)
BUN: 19 mg/dL (ref 6–20)
CALCIUM: 9.4 mg/dL (ref 8.9–10.3)
CO2: 25 mmol/L (ref 22–32)
Chloride: 105 mmol/L (ref 98–111)
Creatinine, Ser: 1.18 mg/dL (ref 0.61–1.24)
GFR calc Af Amer: >60 mL/min (ref >60)
Glucose, Bld: 101 mg/dL — ABNORMAL HIGH (ref 70–99)
POTASSIUM: 3.6 mmol/L (ref 3.5–5.1)
Sodium: 140 mmol/L (ref 135–145)

## 2018-11-30 MED ORDER — IBUPROFEN 800 MG PO TABS
800.0000 mg | ORAL_TABLET | Freq: Once | ORAL | Status: AC
  Administered 2018-11-30: 800 mg via ORAL
  Filled 2018-11-30: qty 1

## 2018-11-30 MED ORDER — ALBUTEROL SULFATE HFA 108 (90 BASE) MCG/ACT IN AERS
2.0000 | INHALATION_SPRAY | RESPIRATORY_TRACT | Status: DC | PRN
  Administered 2018-11-30: 2 via RESPIRATORY_TRACT
  Filled 2018-11-30: qty 6.7

## 2018-11-30 NOTE — ED Provider Notes (Signed)
[facility]-EMERGENCY DEPT Provider Note   CSN: [id number] Arrival date & time: 11/30/18  1959     History   Chief Complaint Chief Complaint  Patient presents with  . Chest Pain    HPI Victor Burns is a 27 y.o. male.  [age] yo male with 2 weeks of sharp and pressure chest pain constantly for 2 weeks. Worse with hugging somebody, coughing, stretching or sneezing.  No fever, no sob, no dvt, no leg swelling, no immobilization.   Past Medical History:  Diagnosis Date  . Abscess   . Arthritis   . Asthma     Patient Active Problem List   Diagnosis Date Noted  . Perirectal abscess s/p I&D 06/16/2018 06/16/2018  . Alcohol abuse 06/16/2018  . Mild persistent asthma 10/07/2017    Past Surgical History:  Procedure Laterality Date  . INCISION AND DRAINAGE PERIRECTAL ABSCESS N/A 06/16/2018   Procedure: EXAM UNDER ANESTHESIA IRRIGATION AND DEBRIDEMENT PERIRECTAL ABSCESS;  Surgeon: Emelia LoronWakefield, Matthew, MD;  Location: [facility];  Service: General;  Laterality: N/A;  . KNEE ARTHROSCOPY          Home Medications    Prior to Admission medications   Medication Sig Start Date End Date Taking? Authorizing Provider  ibuprofen (ADVIL,MOTRIN) 200 MG tablet Take 400 mg by mouth every 6 (six) hours as needed for moderate pain.    Yes [provider]  albuterol (PROVENTIL HFA;VENTOLIN HFA) 108 (90 Base) MCG/ACT inhaler Inhale 2 puffs into the lungs every 4 (four) hours as needed for wheezing or shortness of breath. 05/16/18   McVey, Madelaine BhatElizabeth Whitney, PA-C  butalbital-acetaminophen-caffeine (FIORICET, ESGIC) [phone] MG tablet Take 1-2 tablets by mouth every 6 (six) hours as needed for headache. Patient not taking: Reported on 11/30/2018 05/16/18 05/16/19  McVey, Madelaine BhatElizabeth Whitney, PA-C  ibuprofen (ADVIL,MOTRIN) 600 MG tablet Take 1 tablet (600 mg total) by mouth every 6 (six) hours as needed. Patient not taking: Reported on 11/30/2018 09/29/17   Maczis, Elmer SowMichael M,  PA-C  oxyCODONE-acetaminophen (PERCOCET) 10-325 MG tablet Take 1 tablet by mouth every 6 (six) hours as needed for pain. Patient not taking: Reported on 11/30/2018 06/17/18 06/17/19  Emelia LoronWakefield, Matthew, MD    Family History History reviewed. No pertinent family history.  Social History Social History   Tobacco Use  . Smoking status: Current Every Day Smoker    Packs/day: 0.30    Types: Cigarettes  . Smokeless tobacco: Never Used  Substance Use Topics  . Alcohol use: Not Currently    Comment: at least a beer a day  . Drug use: Not Currently     Allergies   Bee venom and Shellfish allergy   Review of Systems Review of Systems  All other systems reviewed and are negative.    Physical Exam Updated Vital Signs BP (!) 143/88 (BP Location: Left Arm)   Pulse 61   Temp 98.6 F (37 C) (Oral)   Resp 13   SpO2 98%   Physical Exam Vitals signs and nursing note reviewed.  Constitutional:      Appearance: He is well-developed.  HENT:     Head: Normocephalic and atraumatic.     Right Ear: External ear normal.     Left Ear: External ear normal.     Nose: Nose normal.  Eyes:     Conjunctiva/sclera: Conjunctivae normal.     Pupils: Pupils are equal, round, and reactive to light.  Neck:     Musculoskeletal: Normal range of motion and neck supple.  Cardiovascular:     Rate and Rhythm: Normal rate and regular rhythm.     Heart sounds: Normal heart sounds.  Pulmonary:     Effort: Pulmonary effort is normal. No respiratory distress.     Breath sounds: Normal breath sounds. No wheezing.  Chest:     Chest wall: No tenderness.  Abdominal:     General: Bowel sounds are normal. There is no distension.     Palpations: Abdomen is soft. There is no mass.     Tenderness: There is no abdominal tenderness. There is no guarding.  Musculoskeletal: Normal range of motion.  Skin:    General: Skin is warm and dry.  Neurological:     Mental Status: He is alert and oriented to person,  place, and time.     Motor: No abnormal muscle tone.     Coordination: Coordination normal.     Deep Tendon Reflexes: Reflexes are normal and symmetric.  Psychiatric:        Behavior: Behavior normal.        Thought Content: Thought content normal.        Judgment: Judgment normal.      ED Treatments / Results  Labs (all labs ordered are listed, but only abnormal results are displayed) Labs Reviewed  BASIC METABOLIC PANEL - Abnormal; Notable for the following components:      Result Value   Glucose, Bld 101 (*)    All other components within normal limits  CBC - Abnormal; Notable for the following components:   Hemoglobin 12.8 (*)    All other components within normal limits  I-STAT TROPONIN, ED  POCT I-STAT TROPONIN I    EKG None  Radiology Dg Chest 2 View  Result Date: 11/30/2018 CLINICAL DATA:  Chest pain EXAM: CHEST - 2 VIEW COMPARISON:  May 16, 2018 FINDINGS: There is no appreciable edema or consolidation. The heart size and pulmonary vascularity are normal. No adenopathy. No bone lesions. No pneumothorax IMPRESSION: No edema or consolidation. Electronically Signed   By: Bretta BangWilliam  Woodruff III M.D.   On: 11/30/2018 21:07    Procedures Procedures (including critical care time)  Medications Ordered in ED Medications - No data to display   Initial Impression / Assessment and Plan / ED Course  I have reviewed the triage vital signs and the nursing notes.  Pertinent labs & imaging results that were available during my care of the patient were reviewed by me and considered in my medical decision making (see chart for details).       Final Clinical Impressions(s) / ED Diagnoses   Final diagnoses:  Costochondritis, acute    ED Discharge Orders    None       Margarita Grizzleay, Eugenio Dollins, MD 11/30/18 2354

## 2018-11-30 NOTE — ED Provider Notes (Signed)
[facility]-EMER[facility] DEPT Provider Note   CSN: [id number] Arrival date & time: 11/30/18  1959     History   Chief Complaint Chief Complaint  Patient presents with  . Chest Pain    HPI Rolena InfanteUrban Beever is a 27 y.o. male.  HPI 27 year old male presents today complaining of sharp chest pain that began 2 weeks ago and has been constant in nature.  It is worse with hugging, coughing, and sneezing.  He is not dyspneic, no fever, no history of DVT or PE. Past Medical History:  Diagnosis Date  . Abscess   . Arthritis   . Asthma     Patient Active Problem List   Diagnosis Date Noted  . Perirectal abscess s/p I&D 06/16/2018 06/16/2018  . Alcohol abuse 06/16/2018  . Mild persistent asthma 10/07/2017    Past Surgical History:  Procedure Laterality Date  . INCISION AND DRAINAGE PERIRECTAL ABSCESS N/A 06/16/2018   Procedure: EXAM UNDER ANESTHESIA IRRIGATION AND DEBRIDEMENT PERIRECTAL ABSCESS;  Surgeon: Emelia LoronWakefield, Matthew, MD;  Location: [facility];  Service: General;  Laterality: N/A;  . KNEE ARTHROSCOPY          Home Medications    Prior to Admission medications   Medication Sig Start Date End Date Taking? Authorizing Provider  ibuprofen (ADVIL,MOTRIN) 200 MG tablet Take 400 mg by mouth every 6 (six) hours as needed for moderate pain.    Yes [provider]  albuterol (PROVENTIL HFA;VENTOLIN HFA) 108 (90 Base) MCG/ACT inhaler Inhale 2 puffs into the lungs every 4 (four) hours as needed for wheezing or shortness of breath. 05/16/18   McVey, Madelaine BhatElizabeth Whitney, PA-C  butalbital-acetaminophen-caffeine (FIORICET, ESGIC) [phone] MG tablet Take 1-2 tablets by mouth every 6 (six) hours as needed for headache. Patient not taking: Reported on 11/30/2018 05/16/18 05/16/19  McVey, Madelaine BhatElizabeth Whitney, PA-C  ibuprofen (ADVIL,MOTRIN) 600 MG tablet Take 1 tablet (600 mg total) by mouth every 6 (six) hours as needed. Patient not taking: Reported on 11/30/2018 09/29/17    Maczis, Elmer SowMichael M, PA-C  oxyCODONE-acetaminophen (PERCOCET) 10-325 MG tablet Take 1 tablet by mouth every 6 (six) hours as needed for pain. Patient not taking: Reported on 11/30/2018 06/17/18 06/17/19  Emelia LoronWakefield, Matthew, MD    Family History History reviewed. No pertinent family history.  Social History Social History   Tobacco Use  . Smoking status: Current Every Day Smoker    Packs/day: 0.30    Types: Cigarettes  . Smokeless tobacco: Never Used  Substance Use Topics  . Alcohol use: Not Currently    Comment: at least a beer a day  . Drug use: Not Currently     Allergies   Bee venom and Shellfish allergy   Review of Systems Review of Systems  All other systems reviewed and are negative.    Physical Exam Updated Vital Signs BP (!) 143/88 (BP Location: Left Arm)   Pulse 61   Temp 98.6 F (37 C) (Oral)   Resp 13   SpO2 98%   Physical Exam Vitals signs and nursing note reviewed.  HENT:     Head: Normocephalic.  Eyes:     Pupils: Pupils are equal, round, and reactive to light.  Neck:     Musculoskeletal: Normal range of motion.  Cardiovascular:     Rate and Rhythm: Normal rate and regular rhythm.     Heart sounds: Normal heart sounds.  Pulmonary:     Effort: Pulmonary effort is normal.     Breath sounds: Normal breath sounds.  Chest:     Chest wall: Tenderness present.    Abdominal:     Palpations: Abdomen is soft.  Musculoskeletal: Normal range of motion.        General: No tenderness.  Skin:    General: Skin is warm and dry.     Capillary Refill: Capillary refill takes less than 2 seconds.  Neurological:     General: No focal deficit present.     Mental Status: He is alert.  Psychiatric:        Mood and Affect: Mood normal.      ED Treatments / Results  Labs (all labs ordered are listed, but only abnormal results are displayed) Labs Reviewed  BASIC METABOLIC PANEL - Abnormal; Notable for the following components:      Result Value    Glucose, Bld 101 (*)    All other components within normal limits  CBC - Abnormal; Notable for the following components:   Hemoglobin 12.8 (*)    All other components within normal limits  I-STAT TROPONIN, ED  POCT I-STAT TROPONIN I    EKG EKG Interpretation  Date/Time:  Thursday November 30 2018 09:81:1920:08:24 EST Ventricular Rate:  64 PR Interval:    QRS Duration: 89 QT Interval:  387 QTC Calculation: 400 R Axis:   73 Text Interpretation:  Normal sinus rhythm Early repolarization (normal variant) No significant change since last tracing Confirmed by Margarita Grizzleay, Karthikeya Funke [phone]([id number]) on 11/30/2018 10:03:50 PM   Radiology Dg Chest 2 View  Result Date: 11/30/2018 CLINICAL DATA:  Chest pain EXAM: CHEST - 2 VIEW COMPARISON:  May 16, 2018 FINDINGS: There is no appreciable edema or consolidation. The heart size and pulmonary vascularity are normal. No adenopathy. No bone lesions. No pneumothorax IMPRESSION: No edema or consolidation. Electronically Signed   By: Bretta BangWilliam  Woodruff III M.D.   On: 11/30/2018 21:07    Procedures Procedures (including critical care time)  Medications Ordered in ED Medications - No data to display   Initial Impression / Assessment and Plan / ED Course  I have reviewed the triage vital signs and the nursing notes.  Pertinent labs & imaging results that were available during my care of the patient were reviewed by me and considered in my medical decision making (see chart for details).     27 yo male with chest wall pain reproducible on exam Plan ibuprofen  Final Clinical Impressions(s) / ED Diagnoses   Final diagnoses:  Costochondritis, acute    ED Discharge Orders    None       Margarita Grizzleay, Tona Qualley, MD 11/30/18 2206

## 2018-11-30 NOTE — ED Notes (Signed)
Pt verbalized very unhappy, demanded a prescription for ibprufen, not happy that he was told he can get that over  the counter.  Pt demanded to see doctor (was told provider currently busy and waiting will be a while.

## 2019-03-01 ENCOUNTER — Other Ambulatory Visit: Payer: Self-pay

## 2019-03-01 ENCOUNTER — Ambulatory Visit: Payer: Self-pay | Admitting: Family Medicine

## 2019-05-21 ENCOUNTER — Encounter (HOSPITAL_COMMUNITY): Payer: Self-pay

## 2019-05-21 ENCOUNTER — Emergency Department (HOSPITAL_COMMUNITY): Payer: BC Managed Care – PPO

## 2019-05-21 ENCOUNTER — Emergency Department (HOSPITAL_COMMUNITY)
Admission: EM | Admit: 2019-05-21 | Discharge: 2019-05-21 | Disposition: A | Discharge status: Home / Self Care | Payer: BC Managed Care – PPO | Attending: Emergency Medicine | Admitting: Emergency Medicine

## 2019-05-21 ENCOUNTER — Other Ambulatory Visit: Payer: Self-pay

## 2019-05-21 DIAGNOSIS — K611 Rectal abscess: Secondary | ICD-10-CM | POA: Diagnosis not present

## 2019-05-21 DIAGNOSIS — L539 Erythematous condition, unspecified: Secondary | ICD-10-CM | POA: Diagnosis present

## 2019-05-21 DIAGNOSIS — J45909 Unspecified asthma, uncomplicated: Secondary | ICD-10-CM | POA: Insufficient documentation

## 2019-05-21 DIAGNOSIS — F17210 Nicotine dependence, cigarettes, uncomplicated: Secondary | ICD-10-CM | POA: Diagnosis not present

## 2019-05-21 DIAGNOSIS — Z79899 Other long term (current) drug therapy: Secondary | ICD-10-CM | POA: Diagnosis not present

## 2019-05-21 DIAGNOSIS — Z23 Encounter for immunization: Secondary | ICD-10-CM | POA: Diagnosis not present

## 2019-05-21 LAB — CBC WITH DIFFERENTIAL/PLATELET
Abs Immature Granulocytes: 0.02 10*3/uL (ref 0.00–0.07)
Basophils Absolute: 0 10*3/uL (ref 0.0–0.1)
Basophils Relative: 0 %
Eosinophils Absolute: 0 10*3/uL (ref 0.0–0.5)
Eosinophils Relative: 0 %
HCT: 38.6 % — ABNORMAL LOW (ref 39.0–52.0)
Hemoglobin: 12.4 g/dL — ABNORMAL LOW (ref 13.0–17.0)
Immature Granulocytes: 0 %
Lymphocytes Relative: 21 %
Lymphs Abs: 1.4 10*3/uL (ref 0.7–4.0)
MCH: 29.5 pg (ref 26.0–34.0)
MCHC: 32.1 g/dL (ref 30.0–36.0)
MCV: 91.9 fL (ref 80.0–100.0)
Monocytes Absolute: 0.8 10*3/uL (ref 0.1–1.0)
Monocytes Relative: 12 %
Neutro Abs: 4.6 10*3/uL (ref 1.7–7.7)
Neutrophils Relative %: 67 %
Platelets: 308 10*3/uL (ref 150–400)
RBC: 4.2 MIL/uL — ABNORMAL LOW (ref 4.22–5.81)
RDW: 13.2 % (ref 11.5–15.5)
WBC: 6.9 10*3/uL (ref 4.0–10.5)
nRBC: 0 % (ref 0.0–0.2)

## 2019-05-21 LAB — URINALYSIS, ROUTINE W REFLEX MICROSCOPIC
Bilirubin Urine: NEGATIVE
Glucose, UA: NEGATIVE mg/dL
Hgb urine dipstick: NEGATIVE
Ketones, ur: NEGATIVE mg/dL
Leukocytes,Ua: NEGATIVE
Nitrite: NEGATIVE
Protein, ur: NEGATIVE mg/dL
Specific Gravity, Urine: >1.046 — ABNORMAL HIGH (ref 1.005–1.030)
pH: 5 (ref 5.0–8.0)

## 2019-05-21 LAB — COMPREHENSIVE METABOLIC PANEL
ALT: 28 U/L (ref 0–44)
AST: 23 U/L (ref 15–41)
Albumin: 4.3 g/dL (ref 3.5–5.0)
Alkaline Phosphatase: 62 U/L (ref 38–126)
Anion gap: 9 (ref 5–15)
BUN: 18 mg/dL (ref 6–20)
CO2: 25 mmol/L (ref 22–32)
Calcium: 9.2 mg/dL (ref 8.9–10.3)
Chloride: 102 mmol/L (ref 98–111)
Creatinine, Ser: 1.07 mg/dL (ref 0.61–1.24)
GFR calc Af Amer: >60 mL/min (ref >60)
GFR calc non Af Amer: >60 mL/min (ref >60)
Glucose, Bld: 102 mg/dL — ABNORMAL HIGH (ref 70–99)
Potassium: 3.9 mmol/L (ref 3.5–5.1)
Sodium: 136 mmol/L (ref 135–145)
Total Bilirubin: 0.4 mg/dL (ref 0.3–1.2)
Total Protein: 7.8 g/dL (ref 6.5–8.1)

## 2019-05-21 MED ORDER — IOHEXOL 300 MG/ML  SOLN
100.0000 mL | Freq: Once | INTRAMUSCULAR | Status: AC | PRN
  Administered 2019-05-21: 100 mL via INTRAVENOUS

## 2019-05-21 MED ORDER — SODIUM CHLORIDE 0.9 % IV BOLUS
1000.0000 mL | Freq: Once | INTRAVENOUS | Status: AC
  Administered 2019-05-21: 1000 mL via INTRAVENOUS

## 2019-05-21 MED ORDER — MORPHINE SULFATE (PF) 4 MG/ML IV SOLN
4.0000 mg | Freq: Once | INTRAVENOUS | Status: AC
  Administered 2019-05-21: 4 mg via INTRAVENOUS
  Filled 2019-05-21: qty 1

## 2019-05-21 MED ORDER — LIDOCAINE-EPINEPHRINE (PF) 2 %-1:200000 IJ SOLN
20.0000 mL | Freq: Once | INTRAMUSCULAR | Status: AC
  Administered 2019-05-21: 20 mL
  Filled 2019-05-21: qty 20

## 2019-05-21 MED ORDER — AMOXICILLIN-POT CLAVULANATE 875-125 MG PO TABS: 1.0000 | ORAL_TABLET | Freq: Two times a day (BID) | ORAL | 0 refills | Status: AC

## 2019-05-21 MED ORDER — SODIUM CHLORIDE (PF) 0.9 % IJ SOLN
INTRAMUSCULAR | Status: AC
  Filled 2019-05-21: qty 50

## 2019-05-21 MED ORDER — TETANUS-DIPHTH-ACELL PERTUSSIS 5-2.5-18.5 LF-MCG/0.5 IM SUSP
0.5000 mL | Freq: Once | INTRAMUSCULAR | Status: AC
  Administered 2019-05-21: 0.5 mL via INTRAMUSCULAR
  Filled 2019-05-21: qty 0.5

## 2019-05-21 MED ORDER — HYDROCODONE-ACETAMINOPHEN 5-325 MG PO TABS: 1.0000 | ORAL_TABLET | Freq: Four times a day (QID) | ORAL | 0 refills | Status: DC | PRN

## 2019-05-21 MED ORDER — ONDANSETRON HCL 4 MG/2ML IJ SOLN
4.0000 mg | Freq: Once | INTRAMUSCULAR | Status: AC
  Administered 2019-05-21: 4 mg via INTRAVENOUS
  Filled 2019-05-21: qty 2

## 2019-05-21 NOTE — ED Triage Notes (Addendum)
Patient c/o boil on left buttock/inner thigh.   Patient c/o sharp pain radiate from boil to groin area.   8/10 pain   Patient had it drained about 4 months ago.   Patient states he was given amoxicillin the last time but did not finish it due to drinking alcohol.   Patient started taking the amoxicillin again this past Saturday.    A/Ox4.  Ambulatory in triage.

## 2019-05-21 NOTE — ED Notes (Signed)
Pt reminded that urine sample was needed.

## 2019-05-21 NOTE — ED Provider Notes (Addendum)
[facility]-EMERGENCY DEPT Provider Note   CSN: [id number] Arrival date & time: 05/21/19  1702  History   Chief Complaint Chief Complaint  Patient presents with  . Recurrent Skin Infections   HPI Victor Burns is a 28 y.o. male with past medical history significant for perirectal abscess, asthma who presents for evaluation of abscess.  Patient states he developed left-sided perirectal abscess approximately 1 week ago.  Patient states increasingly gotten larger and more painful.  His current pain is an 8/10.  States he feels like his pain radiates into his perineal area.  He denies history of diabetes or prior history of gangrene.  States he did take 2 leftover pills of amoxicillin this past Saturday.  States he has had bedside drainages as well as OR drainages previously.  Denies fever, chills, nausea, vomiting, chest pain, shortness of breath, abdominal pain, dysuria, constipation or diarrhea. No prior history of IBD.  History of multiple prior drainages to same area.  Last PO intake at 1130am- [employer]  History obtained from patient past medical records.  No interpreter was used.    HPI  Past Medical History:  Diagnosis Date  . Abscess   . Arthritis   . Asthma     Patient Active Problem List   Diagnosis Date Noted  . Perirectal abscess s/p I&D 06/16/2018 06/16/2018  . Alcohol abuse 06/16/2018  . Mild persistent asthma 10/07/2017    Past Surgical History:  Procedure Laterality Date  . INCISION AND DRAINAGE PERIRECTAL ABSCESS N/A 06/16/2018   Procedure: EXAM UNDER ANESTHESIA IRRIGATION AND DEBRIDEMENT PERIRECTAL ABSCESS;  Surgeon: Emelia LoronWakefield, Matthew, MD;  Location: [facility];  Service: General;  Laterality: N/A;  . KNEE ARTHROSCOPY          Home Medications    Prior to Admission medications   Medication Sig Start Date End Date Taking? Authorizing Provider  albuterol (PROVENTIL HFA;VENTOLIN HFA) 108 (90 Base) MCG/ACT inhaler Inhale 2 puffs into  the lungs every 4 (four) hours as needed for wheezing or shortness of breath. 05/16/18  Yes McVey, Madelaine BhatElizabeth Whitney, PA-C  hydrocortisone cream 1 % Apply 1 application topically 3 (three) times daily as needed for itching.   Yes [provider]  ibuprofen (ADVIL,MOTRIN) 200 MG tablet Take 400 mg by mouth every 6 (six) hours as needed for moderate pain.    Yes [provider]  vitamin C (ASCORBIC ACID) 500 MG tablet Take 1,000 mg by mouth daily.   Yes [provider]  amoxicillin-clavulanate (AUGMENTIN) 875-125 MG tablet Take 1 tablet by mouth every 12 (twelve) hours for 7 days. 05/21/19 05/28/19  Denim Kalmbach A, PA-C  HYDROcodone-acetaminophen (NORCO/VICODIN) 5-325 MG tablet Take 1 tablet by mouth every 6 (six) hours as needed for severe pain. 05/21/19   Khyren Hing A, PA-C  ibuprofen (ADVIL,MOTRIN) 600 MG tablet Take 1 tablet (600 mg total) by mouth every 6 (six) hours as needed. Patient not taking: Reported on 11/30/2018 09/29/17   Maczis, Elmer SowMichael M, PA-C  oxyCODONE-acetaminophen (PERCOCET) 10-325 MG tablet Take 1 tablet by mouth every 6 (six) hours as needed for pain. Patient not taking: Reported on 11/30/2018 06/17/18 06/17/19  Emelia LoronWakefield, Matthew, MD    Family History No family history on file.  Social History Social History   Tobacco Use  . Smoking status: Current Every Day Smoker    Packs/day: 0.30    Types: Cigarettes  . Smokeless tobacco: Never Used  Substance Use Topics  . Alcohol use: Not Currently    Comment: at  least a beer a day  . Drug use: Not Currently     Allergies   Bee venom and Shellfish allergy   Review of Systems Review of Systems  Constitutional: Negative.   HENT: Negative.   Respiratory: Negative.   Cardiovascular: Negative.   Gastrointestinal: Positive for rectal pain.  Genitourinary: Negative.   Musculoskeletal: Negative.   Skin: Negative.   Neurological: Negative.   All other systems reviewed and are negative.     Physical Exam Updated Vital Signs BP 121/76   Pulse 68   Temp 98.8 F (37.1 C) (Oral)   Resp 18   SpO2 99%   Physical Exam Vitals signs and nursing note reviewed.  Constitutional:      General: He is not in acute distress.    Appearance: He is well-developed. He is not ill-appearing, toxic-appearing or diaphoretic.  HENT:     Head: Normocephalic and atraumatic.     Mouth/Throat:     Mouth: Mucous membranes are moist.     Pharynx: Oropharynx is clear.  Eyes:     Pupils: Pupils are equal, round, and reactive to light.  Neck:     Musculoskeletal: Normal range of motion and neck supple.  Cardiovascular:     Rate and Rhythm: Normal rate and regular rhythm.     Pulses: Normal pulses.     Heart sounds: Normal heart sounds. No murmur. No friction rub. No gallop.   Pulmonary:     Effort: Pulmonary effort is normal. No respiratory distress.     Breath sounds: Normal breath sounds. No stridor. No wheezing, rhonchi or rales.  Abdominal:     General: Bowel sounds are normal. There is no distension.     Palpations: Abdomen is soft. There is no mass.     Tenderness: There is no abdominal tenderness. There is no right CVA tenderness, left CVA tenderness, guarding or rebound.     Hernia: No hernia is present. There is no hernia in the left inguinal area or right inguinal area.  Genitourinary:    Scrotum/Testes: Normal. Cremasteric reflex is present.     Epididymis:     Right: Normal.     Left: Normal.       Comments: Approximately 3 cm area of fluctuance to left perirectal area approximately 1.5 inches from anal sphincter.  Approximately 5 cm area of induration surrounding area of fluctuance.  No surrounding erythema or warmth.  No Hemorrhoid. No anal fissure. No surrounding cellulitic changes. Musculoskeletal: Normal range of motion.     Comments: Moves all 4 extremities without difficulty.  Lymphadenopathy:     Lower Body: No right inguinal adenopathy. No left inguinal adenopathy.   Skin:    General: Skin is warm and dry.  Neurological:     Mental Status: He is alert.    ED Treatments / Results  Labs (all labs ordered are listed, but only abnormal results are displayed) Labs Reviewed  CBC WITH DIFFERENTIAL/PLATELET - Abnormal; Notable for the following components:      Result Value   RBC 4.20 (*)    Hemoglobin 12.4 (*)    HCT 38.6 (*)    All other components within normal limits  COMPREHENSIVE METABOLIC PANEL - Abnormal; Notable for the following components:   Glucose, Bld 102 (*)    All other components within normal limits  URINALYSIS, ROUTINE W REFLEX MICROSCOPIC - Abnormal; Notable for the following components:   Specific Gravity, Urine >1.046 (*)    All other components within normal limits  EKG None  Radiology Ct Abdomen Pelvis W Contrast  Result Date: 05/21/2019 CLINICAL DATA:  Left buttock pain. EXAM: CT ABDOMEN AND PELVIS WITH CONTRAST TECHNIQUE: Multidetector CT imaging of the abdomen and pelvis was performed using the standard protocol following bolus administration of intravenous contrast. CONTRAST:  OMNIPAQUE IOHEXOL 300 MG/ML  SOLN COMPARISON:  None. FINDINGS: Lower chest: No acute abnormality. Hepatobiliary: No focal liver abnormality is seen. No gallstones, gallbladder wall thickening, or biliary dilatation. Pancreas: Unremarkable. No pancreatic ductal dilatation or surrounding inflammatory changes. Spleen: Normal in size without focal abnormality. Adrenals/Urinary Tract: Adrenal glands are unremarkable. Kidneys are without focal lesion, or hydronephrosis. 2 mm nonobstructive calculus in the upper pole of the right kidney. Bladder is unremarkable. Stomach/Bowel: Stomach is within normal limits. Appendix appears normal. No evidence of bowel wall thickening, distention, or inflammatory changes. Vascular/Lymphatic: No significant vascular findings are present. No enlarged abdominal or pelvic lymph nodes. Reproductive: Prostate is unremarkable.  Other: There is a 5.4 x 2.1 cm superficial left buttock/perirectal phlegmon collection with associated surrounding inflammatory changes along the intergluteal cleft. Musculoskeletal: No acute or significant osseous findings. IMPRESSION: 1. 5.4 cm superficial left perirectal abscess. 2. No evidence of acute abnormalities within the solid abdominal organs. 3. 2 mm nonobstructive right renal calculus. Electronically Signed   By: Ted Mcalpine M.D.   On: 05/21/2019 19:53    Procedures .Marland KitchenIncision and Drainage  Date/Time: 05/22/2019 1:43 AM Performed by: Linwood Dibbles, PA-C Authorized by: Linwood Dibbles, PA-C   Consent:    Consent obtained:  Verbal   Consent given by:  Patient   Risks discussed:  Bleeding, incomplete drainage, pain, damage to other organs and infection   Alternatives discussed:  No treatment, delayed treatment, alternative treatment, observation and referral Universal protocol:    Procedure explained and questions answered to patient or proxy's satisfaction: yes     Relevant documents present and verified: yes     Test results available and properly labeled: yes     Imaging studies available: yes     Required blood products, implants, devices, and special equipment available: yes     Site/side marked: yes     Immediately prior to procedure a time out was called: yes     Patient identity confirmed:  Verbally with patient Location:    Type:  Abscess   Size:  5cm x 3cm   Location:  Anogenital   Anogenital location:  Perirectal Pre-procedure details:    Skin preparation:  Betadine Anesthesia (see MAR for exact dosages):    Anesthesia method:  Local infiltration   Local anesthetic:  Lidocaine 1% WITH epi Procedure type:    Complexity:  Complex Procedure details:    Incision types:  Single straight   Incision depth:  Subcutaneous   Scalpel blade:  11   Wound management:  Probed and deloculated, irrigated with saline and extensive cleaning   Drainage:   Purulent   Drainage amount:  Copious   Wound treatment:  Drain placed   Packing materials:  1/4 in gauze   Amount 1/4":  4 inches Post-procedure details:    Patient tolerance of procedure:  Tolerated well, no immediate complications   (including critical care time)  Medications Ordered in ED Medications  sodium chloride (PF) 0.9 % injection (0 mLs  Hold 05/21/19 2150)  ondansetron (ZOFRAN) injection 4 mg (4 mg Intravenous Given 05/21/19 1844)  morphine 4 MG/ML injection 4 mg (4 mg Intravenous Given 05/21/19 1844)  sodium chloride 0.9 % bolus 1,000  mL (0 mLs Intravenous Stopped 05/21/19 2000)  iohexol (OMNIPAQUE) 300 MG/ML solution 100 mL (100 mLs Intravenous Contrast Given 05/21/19 1933)  Tdap (BOOSTRIX) injection 0.5 mL (0.5 mLs Intramuscular Given 05/21/19 2147)  lidocaine-EPINEPHrine (XYLOCAINE W/EPI) 2 %-1:200000 (PF) injection 20 mL (20 mLs Infiltration Given by Other 05/21/19 2146)   Initial Impression / Assessment and Plan / ED Course  I have reviewed the triage vital signs and the nursing notes.  Pertinent labs & imaging results that were available during my care of the patient were reviewed by me and considered in my medical decision making (see chart for details).  28 year old male appears otherwise well presents for evaluation of abscess.  History of perirectal abscess.  Afebrile, nonseptic, non-ill-appearing. Approximately 3 cm area of fluctuance to left perirectal area approximately 1.5 inches from anal sphincter.  Approximately 5 cm area of induration surrounding area of fluctuance.  No surrounding erythema or warmth. No Hemorrhoids. No anal fissure. No surrounding cellulitic changes. Nontender testes without swelling or mass.  Pain radiates into his perineal area.  Will obtain CTA scan to assess for deep abscess and reevaluate.  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 Has seen [facility] multiple times for his recurrent perirectal  abscess.  Labs and imaging personally reviewed.  CBC without leukocytosis, metabolic panel without electrolyte, renal or liver abnormality, urinalysis negative. CT with 5.4 x 2.1 cm superficial left buttock/perirectal phlegmon collection with associated surrounding inflammatory changes along the intergluteal cleft.  Given prior recurrent OR surgical interventions, consulted with Dr. Gerrit FriendsGerkin, general surgery.  Recommends that bedside drainage and if unable to drain reconsult surgery.  Bedside drainage performed.  Extensive purulent drainage and washout performed.  Packing placed.  Patient voiced relief of pain with drainage.  Patient continues to have good rectal tone after procedure.  Patient to be DC'd home with antibiotics and pain medicine.  He is to follow-up with general surgery in 2 days for wound evaluation.  He does not appear septic or ill.  Ambulatory in ED and tolerating p.o. intake in ED prior to discharge.  On reevaluation abdomen soft, nontender with rebound or guarding.   The patient has been appropriately medically screened and/or stabilized in the ED. I have low suspicion for any other emergent medical condition which would require further screening, evaluation or treatment in the ED or require inpatient management.  Patient is hemodynamically stable and in no acute distress.  Patient able to ambulate in department prior to ED.  Evaluation does not show acute pathology that would require ongoing or additional emergent interventions while in the emergency department or further inpatient treatment.  I have discussed the diagnosis with the patient and answered all questions.  Pain is been managed while in the emergency department and patient has no further complaints prior to discharge.  Patient is comfortable with plan discussed in room and is stable for discharge at this time.  I have discussed strict return precautions for returning to the emergency department.  Patient was encouraged  to follow-up with PCP/specialist refer to at discharge.   ADDENDUM : To add procedure note     Final Clinical Impressions(s) / ED Diagnoses   Final diagnoses:  Perirectal abscess    ED Discharge Orders         Ordered    amoxicillin-clavulanate (AUGMENTIN) 875-125 MG tablet  Every 12 hours     05/21/19 2250    HYDROcodone-acetaminophen (NORCO/VICODIN) 5-325 MG tablet  Every 6 hours PRN  05/21/19 2250           Tajay Muzzy A, PA-C 05/21/19 2304    Daxtin Leiker A, PA-C 05/22/19 0146    Dorie Rank, MD 05/22/19 [phone]

## 2019-05-21 NOTE — ED Notes (Signed)
Pt transported to CT ?

## 2019-05-21 NOTE — Discharge Instructions (Signed)
Your evaluated today for abscess.  This is drained in the emergency department.  You need to follow-up with general surgery in 2 days for a wound evaluation.  They are expecting your phone call.  Take your antibiotics as prescribed.  Do not drink alcohol while taking these antibiotics.  Return to the emergency room for new or worsening symptoms such as increased redness or swelling as well as fever or nausea or vomiting.

## 2019-08-06 ENCOUNTER — Other Ambulatory Visit: Payer: Self-pay

## 2019-08-06 ENCOUNTER — Emergency Department (HOSPITAL_COMMUNITY)
Admission: EM | Admit: 2019-08-06 | Discharge: 2019-08-07 | Disposition: A | Discharge status: Home / Self Care | Payer: BC Managed Care – PPO | Source: Home / Self Care | Attending: Emergency Medicine | Admitting: Emergency Medicine

## 2019-08-06 ENCOUNTER — Emergency Department (HOSPITAL_COMMUNITY): Payer: BC Managed Care – PPO

## 2019-08-06 DIAGNOSIS — Z20828 Contact with and (suspected) exposure to other viral communicable diseases: Secondary | ICD-10-CM | POA: Insufficient documentation

## 2019-08-06 DIAGNOSIS — L0231 Cutaneous abscess of buttock: Secondary | ICD-10-CM | POA: Insufficient documentation

## 2019-08-06 DIAGNOSIS — J45909 Unspecified asthma, uncomplicated: Secondary | ICD-10-CM | POA: Insufficient documentation

## 2019-08-06 DIAGNOSIS — K6289 Other specified diseases of anus and rectum: Secondary | ICD-10-CM | POA: Diagnosis not present

## 2019-08-06 DIAGNOSIS — F17210 Nicotine dependence, cigarettes, uncomplicated: Secondary | ICD-10-CM | POA: Insufficient documentation

## 2019-08-06 DIAGNOSIS — Z79899 Other long term (current) drug therapy: Secondary | ICD-10-CM | POA: Insufficient documentation

## 2019-08-06 DIAGNOSIS — L0291 Cutaneous abscess, unspecified: Secondary | ICD-10-CM

## 2019-08-06 DIAGNOSIS — K611 Rectal abscess: Secondary | ICD-10-CM | POA: Diagnosis not present

## 2019-08-06 LAB — COMPREHENSIVE METABOLIC PANEL
ALT: 19 U/L (ref 0–44)
AST: 22 U/L (ref 15–41)
Albumin: 4.7 g/dL (ref 3.5–5.0)
Alkaline Phosphatase: 66 U/L (ref 38–126)
Anion gap: 13 (ref 5–15)
BUN: 13 mg/dL (ref 6–20)
CO2: 25 mmol/L (ref 22–32)
Calcium: 9.7 mg/dL (ref 8.9–10.3)
Chloride: 99 mmol/L (ref 98–111)
Creatinine, Ser: 1.25 mg/dL — ABNORMAL HIGH (ref 0.61–1.24)
GFR calc Af Amer: >60 mL/min (ref >60)
GFR calc non Af Amer: >60 mL/min (ref >60)
Glucose, Bld: 114 mg/dL — ABNORMAL HIGH (ref 70–99)
Potassium: 3.1 mmol/L — ABNORMAL LOW (ref 3.5–5.1)
Sodium: 137 mmol/L (ref 135–145)
Total Bilirubin: 0.9 mg/dL (ref 0.3–1.2)
Total Protein: 8.3 g/dL — ABNORMAL HIGH (ref 6.5–8.1)

## 2019-08-06 LAB — CBC WITH DIFFERENTIAL/PLATELET
Abs Immature Granulocytes: 0.01 10*3/uL (ref 0.00–0.07)
Basophils Absolute: 0 10*3/uL (ref 0.0–0.1)
Basophils Relative: 0 %
Eosinophils Absolute: 0 10*3/uL (ref 0.0–0.5)
Eosinophils Relative: 0 %
HCT: 41.2 % (ref 39.0–52.0)
Hemoglobin: 13.6 g/dL (ref 13.0–17.0)
Immature Granulocytes: 0 %
Lymphocytes Relative: 14 %
Lymphs Abs: 1.4 10*3/uL (ref 0.7–4.0)
MCH: 30 pg (ref 26.0–34.0)
MCHC: 33 g/dL (ref 30.0–36.0)
MCV: 90.7 fL (ref 80.0–100.0)
Monocytes Absolute: 1 10*3/uL (ref 0.1–1.0)
Monocytes Relative: 9 %
Neutro Abs: 7.8 10*3/uL — ABNORMAL HIGH (ref 1.7–7.7)
Neutrophils Relative %: 77 %
Platelets: 296 10*3/uL (ref 150–400)
RBC: 4.54 MIL/uL (ref 4.22–5.81)
RDW: 13.1 % (ref 11.5–15.5)
WBC: 10.2 10*3/uL (ref 4.0–10.5)
nRBC: 0 % (ref 0.0–0.2)

## 2019-08-06 LAB — URINALYSIS, ROUTINE W REFLEX MICROSCOPIC
Bilirubin Urine: NEGATIVE
Glucose, UA: NEGATIVE mg/dL
Hgb urine dipstick: NEGATIVE
Ketones, ur: 5 mg/dL — AB
Leukocytes,Ua: NEGATIVE
Nitrite: NEGATIVE
Protein, ur: NEGATIVE mg/dL
Specific Gravity, Urine: 1.031 — ABNORMAL HIGH (ref 1.005–1.030)
pH: 5 (ref 5.0–8.0)

## 2019-08-06 MED ORDER — HYDROMORPHONE HCL 1 MG/ML IJ SOLN
1.0000 mg | Freq: Once | INTRAMUSCULAR | Status: AC
  Administered 2019-08-06: 1 mg via INTRAVENOUS
  Filled 2019-08-06: qty 1

## 2019-08-06 MED ORDER — CLINDAMYCIN HCL 300 MG PO CAPS: 300.0000 mg | ORAL_CAPSULE | Freq: Three times a day (TID) | ORAL | 0 refills | Status: AC

## 2019-08-06 MED ORDER — IOHEXOL 300 MG/ML  SOLN
100.0000 mL | Freq: Once | INTRAMUSCULAR | Status: AC | PRN
  Administered 2019-08-06: 100 mL via INTRAVENOUS

## 2019-08-06 MED ORDER — SODIUM CHLORIDE (PF) 0.9 % IJ SOLN
INTRAMUSCULAR | Status: AC
  Filled 2019-08-06: qty 50

## 2019-08-06 MED ORDER — LIDOCAINE-EPINEPHRINE (PF) 2 %-1:200000 IJ SOLN
10.0000 mL | Freq: Once | INTRAMUSCULAR | Status: DC
  Filled 2019-08-06: qty 10

## 2019-08-06 MED ORDER — SODIUM CHLORIDE 0.9 % IV BOLUS
500.0000 mL | Freq: Once | INTRAVENOUS | Status: AC
  Administered 2019-08-06: 500 mL via INTRAVENOUS

## 2019-08-06 MED ORDER — HYDROMORPHONE HCL 1 MG/ML IJ SOLN
0.5000 mg | Freq: Once | INTRAMUSCULAR | Status: AC
  Administered 2019-08-07: 0.5 mg via INTRAVENOUS
  Filled 2019-08-06: qty 1

## 2019-08-06 MED ORDER — HYDROCODONE-ACETAMINOPHEN 5-325 MG PO TABS: 2.0000 | ORAL_TABLET | ORAL | 0 refills | Status: DC | PRN

## 2019-08-06 MED ORDER — MORPHINE SULFATE (PF) 4 MG/ML IV SOLN
4.0000 mg | Freq: Once | INTRAVENOUS | Status: AC
  Administered 2019-08-06: 20:00:00 4 mg via INTRAVENOUS
  Filled 2019-08-06: qty 1

## 2019-08-06 MED ORDER — ONDANSETRON HCL 4 MG/2ML IJ SOLN
4.0000 mg | Freq: Once | INTRAMUSCULAR | Status: AC
  Administered 2019-08-06: 4 mg via INTRAVENOUS
  Filled 2019-08-06: qty 2

## 2019-08-06 NOTE — Discharge Instructions (Signed)
Take the pain medicine and antibiotics as prescribed.  Do not eat or drink anything after midnight.  Return in the morning for possible surgical intervention.

## 2019-08-06 NOTE — ED Provider Notes (Signed)
[facility]-EMERGENCY DEPT Provider Note   CSN: [id number] Arrival date & time: 08/06/19  1709     History   Chief Complaint Chief Complaint  Patient presents with  . Abscess    HPI Victor Burns is a 28 y.o. male with a past medical history of recurrent perirectal abscess, who presents today for evaluation of an abscess.  He reports approximately 5 days ago he felt like he was developing an abscess by his testicles.  He treated this with warm compresses, after which it improved and at the same time he developed a abscess near his rectum.  He denies any fevers.  He reports that he has not been having nausea vomiting or diarrhea.  No anterior abdominal pain.         HPI  Past Medical History:  Diagnosis Date  . Abscess   . Arthritis   . Asthma     Patient Active Problem List   Diagnosis Date Noted  . Perirectal abscess s/p I&D 06/16/2018 06/16/2018  . Alcohol abuse 06/16/2018  . Mild persistent asthma 10/07/2017    Past Surgical History:  Procedure Laterality Date  . INCISION AND DRAINAGE PERIRECTAL ABSCESS N/A 06/16/2018   Procedure: EXAM UNDER ANESTHESIA IRRIGATION AND DEBRIDEMENT PERIRECTAL ABSCESS;  Surgeon: Emelia LoronWakefield, Matthew, MD;  Location: [facility];  Service: General;  Laterality: N/A;  . KNEE ARTHROSCOPY          Home Medications    Prior to Admission medications   Medication Sig Start Date End Date Taking? Authorizing Provider  albuterol (PROVENTIL HFA;VENTOLIN HFA) 108 (90 Base) MCG/ACT inhaler Inhale 2 puffs into the lungs every 4 (four) hours as needed for wheezing or shortness of breath. 05/16/18   McVey, Madelaine BhatElizabeth Whitney, PA-C  HYDROcodone-acetaminophen (NORCO/VICODIN) 5-325 MG tablet Take 1 tablet by mouth every 6 (six) hours as needed for severe pain. 05/21/19   Henderly, Britni A, PA-C  hydrocortisone cream 1 % Apply 1 application topically 3 (three) times daily as needed for itching.    [provider]  ibuprofen  (ADVIL,MOTRIN) 200 MG tablet Take 400 mg by mouth every 6 (six) hours as needed for moderate pain.     [provider]  ibuprofen (ADVIL,MOTRIN) 600 MG tablet Take 1 tablet (600 mg total) by mouth every 6 (six) hours as needed. Patient not taking: Reported on 11/30/2018 09/29/17   Jacinto HalimMaczis, Michael M, PA-C  vitamin C (ASCORBIC ACID) 500 MG tablet Take 1,000 mg by mouth daily.    [provider]    Family History No family history on file.  Social History Social History   Tobacco Use  . Smoking status: Current Every Day Smoker    Packs/day: 0.30    Types: Cigarettes  . Smokeless tobacco: Never Used  Substance Use Topics  . Alcohol use: Not Currently    Comment: at least a beer a day  . Drug use: Not Currently     Allergies   Bee venom and Shellfish allergy   Review of Systems Review of Systems  Constitutional: Negative for chills and fever.  Gastrointestinal: Positive for rectal pain. Negative for abdominal pain, diarrhea, nausea and vomiting.  Genitourinary: Negative for dysuria, scrotal swelling and testicular pain.  Neurological: Negative for weakness.  All other systems reviewed and are negative.    Physical Exam Updated Vital Signs BP 138/85   Pulse 80   Temp 99 F (37.2 C) (Oral)   Resp 20   SpO2 99%   Physical Exam Vitals signs  and nursing note reviewed. Exam conducted with a chaperone present (Patient's RN).  Constitutional:      Appearance: He is well-developed.     Comments: Appears uncomfortable.  HENT:     Head: Normocephalic and atraumatic.  Eyes:     Conjunctiva/sclera: Conjunctivae normal.  Neck:     Musculoskeletal: Neck supple.  Cardiovascular:     Rate and Rhythm: Normal rate and regular rhythm.     Heart sounds: No murmur.  Pulmonary:     Effort: Pulmonary effort is normal. No respiratory distress.     Breath sounds: Normal breath sounds.  Abdominal:     General: Abdomen is flat.     Palpations: Abdomen is soft.      Tenderness: There is no abdominal tenderness. There is no guarding.  Genitourinary:    Comments: Exam limited secondary to patient pain.  On the left buttock near the rectum there is an area of swelling and fluctuance.  This area is very painful to palpation.  Patient unable to tolerate exam of scrotum due to difficulty in positioning secondary to pain.  Skin:    General: Skin is warm and dry.  Neurological:     General: No focal deficit present.     Mental Status: He is alert.     Cranial Nerves: No cranial nerve deficit.  Psychiatric:        Mood and Affect: Mood normal.        Behavior: Behavior normal.      ED Treatments / Results  Labs (all labs ordered are listed, but only abnormal results are displayed) Labs Reviewed  CBC WITH DIFFERENTIAL/PLATELET - Abnormal; Notable for the following components:      Result Value   Neutro Abs 7.8 (*)    All other components within normal limits  COMPREHENSIVE METABOLIC PANEL  URINALYSIS, ROUTINE W REFLEX MICROSCOPIC  HIV ANTIBODY (ROUTINE TESTING W REFLEX)  RPR  GC/CHLAMYDIA PROBE AMP ([place]) NOT AT [facility]    EKG None  Radiology No results found.  Procedures Procedures (including critical care time)  Medications Ordered in ED Medications  lidocaine-EPINEPHrine (XYLOCAINE W/EPI) 2 %-1:200000 (PF) injection 10 mL (has no administration in time range)  sodium chloride 0.9 % bolus 500 mL (500 mLs Intravenous Bolus from Bag 08/06/19 1950)  morphine 4 MG/ML injection 4 mg (4 mg Intravenous Given 08/06/19 1951)  ondansetron (ZOFRAN) injection 4 mg (4 mg Intravenous Given 08/06/19 1951)     Initial Impression / Assessment and Plan / ED Course  I have reviewed the triage vital signs and the nursing notes.  Pertinent labs & imaging results that were available during my care of the patient were reviewed by me and considered in my medical decision making (see chart for details).       Patient presents today for evaluation of a  suspected perirectal abscess.  He has had these multiple times in the past requiring incision and drainage.  On exam he is very uncomfortable with an area of fluctuance on the right sided buttock near the rectum.  He reports that this initially started near his testicle.  Given the recurrent nature and palpation states it started near his testicle concern for deeper spread of infection.  Labs and CT pelvis with IV contrast was ordered.  At shift change care was transferred to [facility] who will follow pending studies, re-evaulate and determine disposition.      Final Clinical Impressions(s) / ED Diagnoses   Final diagnoses:  None  ED Discharge Orders    None       Ollen Gross 08/06/19 2029    Dorie Rank, MD 08/07/19 [phone]

## 2019-08-06 NOTE — ED Notes (Signed)
ED Provider at bedside. 

## 2019-08-06 NOTE — ED Provider Notes (Signed)
Care transferred from [place], [place] at shift change, see note for full HPI.  In summation 28 year old male with history of recurrent perirectal abscess presents for evaluation of rectal pain and swelling.  Per previous providers exam difficult due to severe pain.  No systemic symptoms.  Plan for labs, CT scan and reevaluate.  Has had prior surgical drainage in the OR with [facility].  Physical Exam  BP 137/78    Pulse 78    Temp 99 F (37.2 C) (Oral)    Resp 18    SpO2 99%   Physical Exam Vitals signs and nursing note reviewed. Exam conducted with a chaperone present.  Constitutional:      General: He is not in acute distress.    Appearance: He is well-developed. He is not ill-appearing, toxic-appearing or diaphoretic.     Comments: Laying face down on abdomen in bed, writhing in pain.  Appears uncomfortable.  HENT:     Head: Atraumatic.     Nose: Nose normal.     Mouth/Throat:     Mouth: Mucous membranes are moist.     Pharynx: Oropharynx is clear.  Eyes:     Pupils: Pupils are equal, round, and reactive to light.  Neck:     Musculoskeletal: Normal range of motion and neck supple.  Cardiovascular:     Rate and Rhythm: Normal rate and regular rhythm.     Pulses: Normal pulses.     Heart sounds: Normal heart sounds.  Pulmonary:     Effort: Pulmonary effort is normal. No respiratory distress.     Breath sounds: Normal breath sounds.  Abdominal:     General: Bowel sounds are normal. There is no distension.     Palpations: Abdomen is soft.  Genitourinary:    Rectum: Tenderness present.    Musculoskeletal: Normal range of motion.  Skin:    General: Skin is warm and dry.     Comments: Exam difficult secondary to pain.  There is erythema, induration to left gluteal cleft.  Unable to examine anal sphincter, perirectal area or scrotum secondary severe pain.  Neurological:     Mental Status: He is alert.     ED Course/Procedures     Procedures Labs Reviewed    CBC WITH DIFFERENTIAL/PLATELET - Abnormal; Notable for the following components:      Result Value   Neutro Abs 7.8 (*)    All other components within normal limits  COMPREHENSIVE METABOLIC PANEL - Abnormal; Notable for the following components:   Potassium 3.1 (*)    Glucose, Bld 114 (*)    Creatinine, Ser 1.25 (*)    Total Protein 8.3 (*)    All other components within normal limits  URINALYSIS, ROUTINE W REFLEX MICROSCOPIC - Abnormal; Notable for the following components:   Specific Gravity, Urine 1.031 (*)    Ketones, ur 5 (*)    All other components within normal limits  SARS CORONAVIRUS 2 (HOSPITAL ORDER, PERFORMED IN [facility])  HIV ANTIBODY (ROUTINE TESTING W REFLEX)  RPR  GC/CHLAMYDIA PROBE AMP ([facility]  Ct Pelvis W Contrast  Result Date: 08/06/2019 CLINICAL DATA:  Perirectal abscess EXAM: CT PELVIS WITH CONTRAST TECHNIQUE: Multidetector CT imaging of the pelvis was performed using the standard protocol following the bolus administration of intravenous contrast. Patient scanned prone due to the significant discomfort. CONTRAST:  100mL OMNIPAQUE IOHEXOL 300 MG/ML  SOLN COMPARISON:  CT 02/01/2018, 05/21/2019 FINDINGS: Urinary Tract: Distal ureters are unremarkable. Urinary  bladder is largely decompressed at the time of exam and therefore poorly evaluated by CT imaging. Bowel: Mild distal rectal thickening and intersphincteric fat stranding (2/106). Vascular/Lymphatic: The distal aorta is normal caliber. Reactive lymph nodes present in the groin. No pathologically enlarged nodes. Reproductive:  The prostate and seminal vesicles are unremarkable. Other: There is a rim enhancing, centrally hypoattenuating collection in the subcutaneous fat of the left gluteal cleft which measures 3.9 x 2 x 3.9 cm in size (9/111, 2/133). Collection tracks superiorly into the ischioanal fossa and appears to demonstrate a low, transsphincteric extension. There is surrounding  subcutaneous phlegmonous change and superficial skin thickening. Musculoskeletal: Inflammatory features track adjacent to the pelvic floor and gluteal musculature without intramuscular extension or abscess formation. No suspicious osseous lesions. IMPRESSION: 3.9 cm left gluteal abscess with stranding extending to the low rectum and intersphincteric inflammatory features suggesting transsphincteric extension. Overall collection is decreased in size since most recent comparisons. Electronically Signed   By: Lovena Le M.D.   On: 08/06/2019 22:34   MDM  Patient with 10/10 pain on evaluation after Morphine. Will order Dilaudid  With continued 9/10 pain on evaluation after Dilaudid.  Patient will minimally let me examine area, there is erythema to left gluteal cleft as well as induration.  Unable to assess for fluctuance as patient in severe pain and screaming on exam.    CT scan with 3.9 x 3.9 cm left gluteal abscess which appears to be extending into the trans-sphincteric region.  Unable to perform bedside drainage due to patient's severe pain.  Will consult general surgery.  Consulted with Dr. Tera Helper, general surgery.  Recommends pain management outpatient and have patient return tomorrow for possible surgical intervention.  Patient likely needs exam under anesthesia secondary to his severe pain.  Dr. Tera Helper does not feel patient needs to be admitted tonight given stable vital signs, no leukocytosis.  Discussed plan with patient. Patient and family in room comfortable with plan. Will DC home with pain medicine, patient to be n.p.o. after midnight and patient to return tomorrow for possible surgical intervention. Patient given Abx and discussed warm soaks.  The patient has been appropriately medically screened and/or stabilized in the ED. I have low suspicion for any other emergent medical condition which would require further screening, evaluation or treatment in the ED or require inpatient  management.  Patient is hemodynamically stable and in no acute distress.  Patient able to ambulate in department prior to ED.  Evaluation does not show acute pathology that would require ongoing or additional emergent interventions while in the emergency department or further inpatient treatment.  I have discussed the diagnosis with the patient and answered all questions.  Pain is been managed while in the emergency department and patient has no further complaints prior to discharge.  Patient is comfortable with plan discussed in room and is stable for discharge at this time.  I have discussed strict return precautions for returning to the emergency department.  Patient was encouraged to follow-up with PCP/specialist refer to at discharge.      Nettie Elm, PA-C 08/07/19 0002    Julianne Rice, MD 08/10/19 [phone]

## 2019-08-06 NOTE — ED Triage Notes (Signed)
Pt c/o abscess between buttocks, going on for 5 days.

## 2019-08-07 ENCOUNTER — Observation Stay (HOSPITAL_COMMUNITY): Payer: BC Managed Care – PPO | Admitting: Certified Registered Nurse Anesthetist

## 2019-08-07 ENCOUNTER — Other Ambulatory Visit: Payer: Self-pay

## 2019-08-07 ENCOUNTER — Inpatient Hospital Stay (HOSPITAL_COMMUNITY)
Admission: EM | Admit: 2019-08-07 | Discharge: 2019-08-09 | DRG: 345 | Disposition: A | Discharge status: Home / Self Care | Payer: BC Managed Care – PPO | Attending: Surgery | Admitting: Surgery

## 2019-08-07 ENCOUNTER — Encounter (HOSPITAL_COMMUNITY): Payer: Self-pay | Admitting: Emergency Medicine

## 2019-08-07 DIAGNOSIS — J4530 Mild persistent asthma, uncomplicated: Secondary | ICD-10-CM | POA: Diagnosis present

## 2019-08-07 DIAGNOSIS — Z20828 Contact with and (suspected) exposure to other viral communicable diseases: Secondary | ICD-10-CM | POA: Diagnosis present

## 2019-08-07 DIAGNOSIS — E860 Dehydration: Secondary | ICD-10-CM | POA: Diagnosis present

## 2019-08-07 DIAGNOSIS — F1010 Alcohol abuse, uncomplicated: Secondary | ICD-10-CM | POA: Diagnosis present

## 2019-08-07 DIAGNOSIS — K6289 Other specified diseases of anus and rectum: Secondary | ICD-10-CM | POA: Diagnosis present

## 2019-08-07 DIAGNOSIS — F1910 Other psychoactive substance abuse, uncomplicated: Secondary | ICD-10-CM | POA: Diagnosis present

## 2019-08-07 DIAGNOSIS — N189 Chronic kidney disease, unspecified: Secondary | ICD-10-CM | POA: Diagnosis present

## 2019-08-07 DIAGNOSIS — K611 Rectal abscess: Secondary | ICD-10-CM | POA: Diagnosis present

## 2019-08-07 DIAGNOSIS — Z79899 Other long term (current) drug therapy: Secondary | ICD-10-CM

## 2019-08-07 DIAGNOSIS — Z91013 Allergy to seafood: Secondary | ICD-10-CM

## 2019-08-07 DIAGNOSIS — K644 Residual hemorrhoidal skin tags: Secondary | ICD-10-CM | POA: Diagnosis present

## 2019-08-07 DIAGNOSIS — K648 Other hemorrhoids: Secondary | ICD-10-CM | POA: Diagnosis present

## 2019-08-07 DIAGNOSIS — Z91030 Bee allergy status: Secondary | ICD-10-CM | POA: Diagnosis not present

## 2019-08-07 DIAGNOSIS — Z4659 Encounter for fitting and adjustment of other gastrointestinal appliance and device: Secondary | ICD-10-CM

## 2019-08-07 DIAGNOSIS — L0231 Cutaneous abscess of buttock: Secondary | ICD-10-CM | POA: Diagnosis present

## 2019-08-07 DIAGNOSIS — Z79891 Long term (current) use of opiate analgesic: Secondary | ICD-10-CM

## 2019-08-07 DIAGNOSIS — R112 Nausea with vomiting, unspecified: Secondary | ICD-10-CM

## 2019-08-07 DIAGNOSIS — K920 Hematemesis: Secondary | ICD-10-CM

## 2019-08-07 DIAGNOSIS — Z791 Long term (current) use of non-steroidal anti-inflammatories (NSAID): Secondary | ICD-10-CM | POA: Diagnosis not present

## 2019-08-07 LAB — RPR: RPR Ser Ql: NONREACTIVE — AB

## 2019-08-07 LAB — CBC WITH DIFFERENTIAL/PLATELET
Abs Immature Granulocytes: 0.03 10*3/uL (ref 0.00–0.07)
Basophils Absolute: 0 10*3/uL (ref 0.0–0.1)
Basophils Relative: 0 %
Eosinophils Absolute: 0 10*3/uL (ref 0.0–0.5)
Eosinophils Relative: 0 %
HCT: 41.3 % (ref 39.0–52.0)
Hemoglobin: 13.6 g/dL (ref 13.0–17.0)
Immature Granulocytes: 0 %
Lymphocytes Relative: 11 %
Lymphs Abs: 1.1 10*3/uL (ref 0.7–4.0)
MCH: 30 pg (ref 26.0–34.0)
MCHC: 32.9 g/dL (ref 30.0–36.0)
MCV: 91 fL (ref 80.0–100.0)
Monocytes Absolute: 1 10*3/uL (ref 0.1–1.0)
Monocytes Relative: 9 %
Neutro Abs: 8.4 10*3/uL — ABNORMAL HIGH (ref 1.7–7.7)
Neutrophils Relative %: 80 %
Platelets: 304 10*3/uL (ref 150–400)
RBC: 4.54 MIL/uL (ref 4.22–5.81)
RDW: 12.8 % (ref 11.5–15.5)
WBC: 10.5 10*3/uL (ref 4.0–10.5)
nRBC: 0 % (ref 0.0–0.2)

## 2019-08-07 LAB — BASIC METABOLIC PANEL
Anion gap: 14 (ref 5–15)
BUN: 8 mg/dL (ref 6–20)
CO2: 23 mmol/L (ref 22–32)
Calcium: 9.6 mg/dL (ref 8.9–10.3)
Chloride: 99 mmol/L (ref 98–111)
Creatinine, Ser: 1.18 mg/dL (ref 0.61–1.24)
GFR calc Af Amer: >60 mL/min (ref >60)
GFR calc non Af Amer: >60 mL/min (ref >60)
Glucose, Bld: 117 mg/dL — ABNORMAL HIGH (ref 70–99)
Potassium: 3.4 mmol/L — ABNORMAL LOW (ref 3.5–5.1)
Sodium: 136 mmol/L (ref 135–145)

## 2019-08-07 LAB — SARS CORONAVIRUS 2 BY RT PCR (HOSPITAL ORDER, PERFORMED IN ~~LOC~~ HOSPITAL LAB): SARS Coronavirus 2: NEGATIVE

## 2019-08-07 LAB — HIV ANTIBODY (ROUTINE TESTING W REFLEX): HIV Screen 4th Generation wRfx: NONREACTIVE

## 2019-08-07 MED ORDER — ONDANSETRON HCL 4 MG/2ML IJ SOLN
INTRAMUSCULAR | Status: AC
  Filled 2019-08-07: qty 2

## 2019-08-07 MED ORDER — PIPERACILLIN-TAZOBACTAM 3.375 G IVPB
3.3750 g | Freq: Three times a day (TID) | INTRAVENOUS | Status: DC
  Administered 2019-08-07 – 2019-08-09 (×6): 3.375 g via INTRAVENOUS
  Filled 2019-08-07 (×6): qty 50

## 2019-08-07 MED ORDER — LACTATED RINGERS IV SOLN
INTRAVENOUS | Status: DC
  Administered 2019-08-07: 17:00:00 via INTRAVENOUS

## 2019-08-07 MED ORDER — HYDROMORPHONE HCL 1 MG/ML IJ SOLN
1.0000 mg | INTRAMUSCULAR | Status: DC | PRN
  Administered 2019-08-07 (×2): 1.5 mg via INTRAVENOUS
  Administered 2019-08-08 – 2019-08-09 (×3): 1 mg via INTRAVENOUS
  Filled 2019-08-07 (×3): qty 1
  Filled 2019-08-07 (×2): qty 1.5

## 2019-08-07 MED ORDER — FOLIC ACID 1 MG PO TABS
1.0000 mg | ORAL_TABLET | Freq: Every day | ORAL | Status: DC
  Administered 2019-08-09: 1 mg via ORAL
  Filled 2019-08-07: qty 1

## 2019-08-07 MED ORDER — ONDANSETRON 4 MG PO TBDP: 4.0000 mg | ORAL_TABLET | Freq: Four times a day (QID) | ORAL | Status: DC | PRN

## 2019-08-07 MED ORDER — DIPHENHYDRAMINE HCL 25 MG PO CAPS: 25.0000 mg | ORAL_CAPSULE | Freq: Four times a day (QID) | ORAL | Status: DC | PRN

## 2019-08-07 MED ORDER — THIAMINE HCL 100 MG/ML IJ SOLN
100.0000 mg | Freq: Every day | INTRAMUSCULAR | Status: DC
  Filled 2019-08-07 (×2): qty 1

## 2019-08-07 MED ORDER — MIDAZOLAM HCL 2 MG/2ML IJ SOLN
INTRAMUSCULAR | Status: AC
  Filled 2019-08-07: qty 2

## 2019-08-07 MED ORDER — LORAZEPAM 1 MG PO TABS: 1.0000 mg | ORAL_TABLET | Freq: Four times a day (QID) | ORAL | Status: DC | PRN

## 2019-08-07 MED ORDER — ONDANSETRON HCL 4 MG/2ML IJ SOLN
4.0000 mg | Freq: Four times a day (QID) | INTRAMUSCULAR | Status: DC | PRN
  Administered 2019-08-08 – 2019-08-09 (×2): 4 mg via INTRAVENOUS
  Filled 2019-08-07 (×2): qty 2

## 2019-08-07 MED ORDER — FENTANYL CITRATE (PF) 100 MCG/2ML IJ SOLN
INTRAMUSCULAR | Status: AC
  Filled 2019-08-07: qty 2

## 2019-08-07 MED ORDER — LORAZEPAM 2 MG/ML IJ SOLN: 0.0000 mg | Freq: Two times a day (BID) | INTRAMUSCULAR | Status: DC

## 2019-08-07 MED ORDER — HYDROMORPHONE HCL 1 MG/ML IJ SOLN
1.0000 mg | Freq: Once | INTRAMUSCULAR | Status: AC
  Administered 2019-08-07: 1 mg via INTRAVENOUS
  Filled 2019-08-07: qty 1

## 2019-08-07 MED ORDER — PROPOFOL 10 MG/ML IV BOLUS
INTRAVENOUS | Status: AC
  Filled 2019-08-07: qty 40

## 2019-08-07 MED ORDER — LORAZEPAM 2 MG/ML IJ SOLN
1.0000 mg | Freq: Four times a day (QID) | INTRAMUSCULAR | Status: DC | PRN
  Filled 2019-08-07: qty 1

## 2019-08-07 MED ORDER — SODIUM CHLORIDE 0.9 % IV SOLN
INTRAVENOUS | Status: DC
  Administered 2019-08-08: via INTRAVENOUS

## 2019-08-07 MED ORDER — VITAMIN B-1 100 MG PO TABS
100.0000 mg | ORAL_TABLET | Freq: Every day | ORAL | Status: DC
  Administered 2019-08-09: 100 mg via ORAL
  Filled 2019-08-07: qty 1

## 2019-08-07 MED ORDER — DEXAMETHASONE SODIUM PHOSPHATE 10 MG/ML IJ SOLN
INTRAMUSCULAR | Status: AC
  Filled 2019-08-07: qty 1

## 2019-08-07 MED ORDER — PROPOFOL 10 MG/ML IV BOLUS
INTRAVENOUS | Status: AC
  Filled 2019-08-07: qty 20

## 2019-08-07 MED ORDER — LIDOCAINE 2% (20 MG/ML) 5 ML SYRINGE
INTRAMUSCULAR | Status: AC
  Filled 2019-08-07: qty 10

## 2019-08-07 MED ORDER — MUPIROCIN 2 % EX OINT
1.0000 "application " | TOPICAL_OINTMENT | Freq: Two times a day (BID) | CUTANEOUS | Status: DC
  Administered 2019-08-08 – 2019-08-09 (×2): 1 via NASAL
  Filled 2019-08-07 (×2): qty 22

## 2019-08-07 MED ORDER — SODIUM CHLORIDE 0.9 % IV SOLN
INTRAVENOUS | Status: DC
  Administered 2019-08-07: 15:00:00 via INTRAVENOUS

## 2019-08-07 MED ORDER — DIPHENHYDRAMINE HCL 50 MG/ML IJ SOLN
25.0000 mg | Freq: Four times a day (QID) | INTRAMUSCULAR | Status: DC | PRN
  Administered 2019-08-07: 25 mg via INTRAVENOUS
  Filled 2019-08-07: qty 1

## 2019-08-07 MED ORDER — LORAZEPAM 2 MG/ML IJ SOLN
0.0000 mg | Freq: Four times a day (QID) | INTRAMUSCULAR | Status: AC
  Administered 2019-08-08 – 2019-08-09 (×2): 2 mg via INTRAVENOUS
  Filled 2019-08-07 (×2): qty 1

## 2019-08-07 MED ORDER — ADULT MULTIVITAMIN W/MINERALS CH
1.0000 | ORAL_TABLET | Freq: Every day | ORAL | Status: DC
  Administered 2019-08-09: 1 via ORAL
  Filled 2019-08-07: qty 1

## 2019-08-07 NOTE — ED Notes (Signed)
Pt upset with being triaged in the designated area in the lobby, States he was told to come back today for surgery. He states they should have kept me (he did not meet any criteria for admission last night). He states some woman in green called his name then walked off, Morey Hummingbird RN was on her way to triage him. He states he does not feel comfortable in waiting room due to having to sit in odd position. Blanket was placed over him for privacy by Probation officer. He wants to wait in a room, after several times of explaining we do not have rooms available and letting him know he can talk with pts compliance with further concerns, Probation officer apologized again for any mis understanding then excused herself.

## 2019-08-07 NOTE — Progress Notes (Signed)
2 bags of pts belongings with crutches brought to [facility] from ER. Instructed to take to PACU lockers due to pt being transported to surgery now.

## 2019-08-07 NOTE — Progress Notes (Signed)
pts surgery cancelled for this evening per surgeon and moved to tomorrow. Pt transferring to 3 E for observation. Report given to [facility].

## 2019-08-07 NOTE — H&P (Signed)
[facility] Consult Note  Victor Burns [birth year]  [id number].    Requesting MD: Dr. Dene Gentry Chief Complaint: Rectal pain Reason for Consult: Recurrent perirectal abscess   HPI:  Patient is a 28 year old male who presented to the ED with a recurrent perirectal abscess.  He was seen on 02/01/2018 and again on 06/16/2018 with perirectal abscesses.  He presented to the ED yesterday with a complaint of a perirectal abscess was ongoing for about 5 days.  Pain was severe and they were unable to examine him.  He was discharged home with some pain medications and local wound care and instructed to come back to the ED today.  Patient is afebrile.  Labs obtained yesterday showed a potassium of 3.1, glucose of 114, creatinine of 1.25.  WBC 10.2, hemoglobin 13.6, hematocrit 42.2.  A CT of the pelvis shows a 3.9 x 2 x 3.9 cm left gluteal abscess with stranding  extending to the low rectum and intersphincteric inflammatory features suggesting transsphincteric extension.  We are asked to see. He also reports he has been HIV negative in the past.   ROS: Review of Systems  Constitutional: Negative.   HENT: Negative.   Eyes: Negative.   Respiratory: Negative.   Cardiovascular: Negative.   Gastrointestinal: Negative.        His only complaint is left perirectal pain.  Genitourinary: Negative.   Musculoskeletal: Negative.   Skin: Negative.   Neurological: Negative.   Endo/Heme/Allergies: Negative.   Psychiatric/Behavioral: Negative.     No family history on file.  Past Medical History:  Diagnosis Date  . Abscess   . Arthritis   . Asthma     Past Surgical History:  Procedure Laterality Date  . INCISION AND DRAINAGE PERIRECTAL ABSCESS N/A 06/16/2018   Procedure: EXAM UNDER ANESTHESIA IRRIGATION AND DEBRIDEMENT PERIRECTAL ABSCESS;  Surgeon: Rolm Bookbinder, MD;  Location: [facility];  Service: General;  Laterality: N/A;  . KNEE ARTHROSCOPY      Social History:  reports that  he has been smoking cigarettes. He has been smoking about 0.30 packs per day. He has never used smokeless tobacco. He reports previous alcohol use. He reports previous drug use. He is single and lives with his boyfriend. They are not monogamous. EtOH: Heavy -case of beer per day.  Up to 2 gallons of rum on the weekends. Drugs: Marijuana, ecstasy, and cocaine on the weekends.  Last use 08/05/2019 Tobacco: Less than 1 pack/day. He works as a Pharmacist, community for [employer]   Allergies:  Allergies  Allergen Reactions  . Bee Venom Anaphylaxis  . Shellfish Allergy Anaphylaxis    Prior to Admission medications   Medication Sig Start Date End Date Taking? Authorizing Provider  albuterol (PROVENTIL HFA;VENTOLIN HFA) 108 (90 Base) MCG/ACT inhaler Inhale 2 puffs into the lungs every 4 (four) hours as needed for wheezing or shortness of breath. 05/16/18   McVey, Gelene Mink, PA-C  clindamycin (CLEOCIN) 300 MG capsule Take 1 capsule (300 mg total) by mouth 3 (three) times daily for 5 days. 08/06/19 08/11/19  Henderly, Britni A, PA-C  HYDROcodone-acetaminophen (NORCO/VICODIN) 5-325 MG tablet Take 2 tablets by mouth every 4 (four) hours as needed. 08/06/19   Henderly, Britni A, PA-C  hydrocortisone cream 1 % Apply 1 application topically 3 (three) times daily as needed for itching.    [provider]  ibuprofen (ADVIL,MOTRIN) 200 MG tablet Take 400 mg by mouth every 6 (six) hours as needed for moderate pain.     [provider]  ibuprofen (ADVIL,MOTRIN) 600 MG tablet Take 1 tablet (600 mg total) by mouth every 6 (six) hours as needed. Patient not taking: Reported on 11/30/2018 09/29/17   Jacinto HalimMaczis, Michael M, PA-C  vitamin C (ASCORBIC ACID) 500 MG tablet Take 1,000 mg by mouth daily.    [provider]     Blood pressure 133/76, pulse 88, temperature 99.7 F (37.6 C), temperature source Oral, resp. rate 17, SpO2 100 %. Physical Exam: Physical Exam Constitutional:      Appearance:  Normal appearance. He is normal weight.     Comments: He is having significant pain.  He cannot turn over even on his side for an exam.  He is lying prone on his stomach.  He was using crutches to help ambulate.  HENT:     Head: Normocephalic and atraumatic.  Eyes:     Comments: Pupils are equal  Cardiovascular:     Rate and Rhythm: Normal rate and regular rhythm.     Pulses: Normal pulses.  Pulmonary:     Effort: Pulmonary effort is normal. No respiratory distress.     Breath sounds: Normal breath sounds. No stridor. No wheezing, rhonchi or rales.  Chest:     Chest wall: No tenderness.  Abdominal:     General: Abdomen is flat. Bowel sounds are normal. There is no distension.     Palpations: Abdomen is soft.  Genitourinary:    Comments: He has a 2 cm area left buttocks that is tender and swollen.  Slightly fluctuant.  This area has prior scars from prior perirectal abscesses.  He really cannot tolerate any exam, aside from what I can see externally. Musculoskeletal:        General: No swelling.  Skin:    General: Skin is warm and dry.     Capillary Refill: Capillary refill takes less than 2 seconds.  Neurological:     General: No focal deficit present.     Mental Status: He is alert.     Cranial Nerves: No cranial nerve deficit.  Psychiatric:        Mood and Affect: Mood normal.        Behavior: Behavior normal.        Thought Content: Thought content normal.        Judgment: Judgment normal.     Results for orders placed or performed during the hospital encounter of 08/06/19 (from the past 48 hour(s))  CBC with Differential     Status: Abnormal   Collection Time: 08/06/19  7:22 PM  Result Value Ref Range   WBC 10.2 4.0 - 10.5 K/uL   RBC 4.54 4.22 - 5.81 MIL/uL   Hemoglobin 13.6 13.0 - 17.0 g/dL   HCT 16.141.2 09.639.0 - 04.552.0 %   MCV 90.7 80.0 - 100.0 fL   MCH 30.0 26.0 - 34.0 pg   MCHC 33.0 30.0 - 36.0 g/dL   RDW 40.913.1 81.111.5 - 91.415.5 %   Platelets 296 150 - 400 K/uL   nRBC 0.0  0.0 - 0.2 %   Neutrophils Relative % 77 %   Neutro Abs 7.8 (H) 1.7 - 7.7 K/uL   Lymphocytes Relative 14 %   Lymphs Abs 1.4 0.7 - 4.0 K/uL   Monocytes Relative 9 %   Monocytes Absolute 1.0 0.1 - 1.0 K/uL   Eosinophils Relative 0 %   Eosinophils Absolute 0.0 0.0 - 0.5 K/uL   Basophils Relative 0 %   Basophils Absolute 0.0 0.0 - 0.1 K/uL   Immature  Granulocytes 0 %   Abs Immature Granulocytes 0.01 0.00 - 0.07 K/uL    Comment: Performed at [facility], [address], [place], [place] [address]  Comprehensive metabolic panel     Status: Abnormal   Collection Time: 08/06/19  7:22 PM  Result Value Ref Range   Sodium 137 135 - 145 mmol/L   Potassium 3.1 (L) 3.5 - 5.1 mmol/L   Chloride 99 98 - 111 mmol/L   CO2 25 22 - 32 mmol/L   Glucose, Bld 114 (H) 70 - 99 mg/dL   BUN 13 6 - 20 mg/dL   Creatinine, Ser 7.62 (H) 0.61 - 1.24 mg/dL   Calcium 9.7 8.9 - 83.1 mg/dL   Total Protein 8.3 (H) 6.5 - 8.1 g/dL   Albumin 4.7 3.5 - 5.0 g/dL   AST 22 15 - 41 U/L   ALT 19 0 - 44 U/L   Alkaline Phosphatase 66 38 - 126 U/L   Total Bilirubin 0.9 0.3 - 1.2 mg/dL   GFR calc non Af Amer >60 >60 mL/min   GFR calc Af Amer >60 >60 mL/min   Anion gap 13 5 - 15    Comment: Performed at [facility], [address], [place], [place] [address]  Urinalysis, Routine w reflex microscopic     Status: Abnormal   Collection Time: 08/06/19  7:54 PM  Result Value Ref Range   Color, Urine YELLOW YELLOW   APPearance CLEAR CLEAR   Specific Gravity, Urine 1.031 (H) 1.005 - 1.030   pH 5.0 5.0 - 8.0   Glucose, UA NEGATIVE NEGATIVE mg/dL   Hgb urine dipstick NEGATIVE NEGATIVE   Bilirubin Urine NEGATIVE NEGATIVE   Ketones, ur 5 (A) NEGATIVE mg/dL   Protein, ur NEGATIVE NEGATIVE mg/dL   Nitrite NEGATIVE NEGATIVE   Leukocytes,Ua NEGATIVE NEGATIVE    Comment: Performed at [facility], [address] [place], [place] [address]  RPR     Status: None   Collection  Time: 08/06/19  8:30 PM  Result Value Ref Range   RPR Ser Ql NON REACTIVE NON REACTIVE    Comment: Performed at [facility], [address], [place], [place] [address]   Ct Pelvis W Contrast  Result Date: 08/06/2019 CLINICAL DATA:  Perirectal abscess EXAM: CT PELVIS WITH CONTRAST TECHNIQUE: Multidetector CT imaging of the pelvis was performed using the standard protocol following the bolus administration of intravenous contrast. Patient scanned prone due to the significant discomfort. CONTRAST:  OMNIPAQUE IOHEXOL 300 MG/ML  SOLN COMPARISON:  CT 02/01/2018, 05/21/2019 FINDINGS: Urinary Tract: Distal ureters are unremarkable. Urinary bladder is largely decompressed at the time of exam and therefore poorly evaluated by CT imaging. Bowel: Mild distal rectal thickening and intersphincteric fat stranding (2/106). Vascular/Lymphatic: The distal aorta is normal caliber. Reactive lymph nodes present in the groin. No pathologically enlarged nodes. Reproductive:  The prostate and seminal vesicles are unremarkable. Other: There is a rim enhancing, centrally hypoattenuating collection in the subcutaneous fat of the left gluteal cleft which measures 3.9 x 2 x 3.9 cm in size (9/111, 2/133). Collection tracks superiorly into the ischioanal fossa and appears to demonstrate a low, transsphincteric extension. There is surrounding subcutaneous phlegmonous change and superficial skin thickening. Musculoskeletal: Inflammatory features track adjacent to the pelvic floor and gluteal musculature without intramuscular extension or abscess formation. No suspicious osseous lesions. IMPRESSION: 3.9 cm left gluteal abscess with stranding extending to the low rectum and intersphincteric inflammatory features suggesting transsphincteric  extension. Overall collection is decreased in size since most recent comparisons. Electronically Signed   By: Kreg ShropshirePrice  DeHay M.D.   On: 08/06/2019 22:34      Assessment/Plan Recurrent  perirectal abscess on the left Polysubstance abuse -EtOH/multiple drug use/tobacco Dehydration CKD -possibly secondary to dehydration.  Plan: Patient has not eaten for over 24 hours.  He is having COVID study done now.  We will keep him n.p.o. and aim to do exam under anesthesia with I&D later this afternoon.  I will keep him hydrated and put him on a SIWA protocol for his polysubstance use.  Start him on IV Zosyn for now.    [facility] PA-C  [facility] Pager:  [phone]    08/07/2019 1:34 PM

## 2019-08-07 NOTE — ED Triage Notes (Signed)
Pt reports that he was here yesterday for rectal abscess and having worsening pains.

## 2019-08-07 NOTE — ED Provider Notes (Signed)
[facility]-EMERGENCY DEPT Provider Note   CSN: [id number] Arrival date & time: 08/07/19  1011     History   Chief Complaint Chief Complaint  Patient presents with  . Rectal Pain    HPI Victor Burns is a 28 y.o. male.     28 year old male with prior medical history as detailed below presents for evaluation of rectal pain.  Patient was seen here last night for same complaint.  He was diagnosed with a roughly 4 x 4 centimeter left gluteal abscess.  Patient's case was discussed with surgery.  Patient was to go home and return today for likely OR drainage.   Patient is complaining of significant pain.  He reports that he has been n.p.o. since leaving the ED last night.  The history is provided by the patient and medical records.  Illness Location:  Rectal pain Severity:  Moderate Onset quality:  Gradual Duration:  4 days Timing:  Constant Progression:  Worsening Chronicity:  Recurrent Associated symptoms: no fever     Past Medical History:  Diagnosis Date  . Abscess   . Arthritis   . Asthma     Patient Active Problem List   Diagnosis Date Noted  . Perirectal abscess s/p I&D 06/16/2018 06/16/2018  . Alcohol abuse 06/16/2018  . Mild persistent asthma 10/07/2017    Past Surgical History:  Procedure Laterality Date  . INCISION AND DRAINAGE PERIRECTAL ABSCESS N/A 06/16/2018   Procedure: EXAM UNDER ANESTHESIA IRRIGATION AND DEBRIDEMENT PERIRECTAL ABSCESS;  Surgeon: Emelia Loron, MD;  Location: [facility]S;  Service: General;  Laterality: N/A;  . KNEE ARTHROSCOPY          Home Medications    Prior to Admission medications   Medication Sig Start Date End Date Taking? Authorizing Provider  albuterol (PROVENTIL HFA;VENTOLIN HFA) 108 (90 Base) MCG/ACT inhaler Inhale 2 puffs into the lungs every 4 (four) hours as needed for wheezing or shortness of breath. 05/16/18   McVey, Madelaine Bhat, PA-C  clindamycin (CLEOCIN) 300 MG capsule Take 1  capsule (300 mg total) by mouth 3 (three) times daily for 5 days. 08/06/19 08/11/19  Henderly, Britni A, PA-C  HYDROcodone-acetaminophen (NORCO/VICODIN) 5-325 MG tablet Take 2 tablets by mouth every 4 (four) hours as needed. 08/06/19   Henderly, Britni A, PA-C  hydrocortisone cream 1 % Apply 1 application topically 3 (three) times daily as needed for itching.    [provider]  ibuprofen (ADVIL,MOTRIN) 200 MG tablet Take 400 mg by mouth every 6 (six) hours as needed for moderate pain.     [provider]  ibuprofen (ADVIL,MOTRIN) 600 MG tablet Take 1 tablet (600 mg total) by mouth every 6 (six) hours as needed. Patient not taking: Reported on 11/30/2018 09/29/17   Jacinto Halim, PA-C  vitamin C (ASCORBIC ACID) 500 MG tablet Take 1,000 mg by mouth daily.    [provider]    Family History No family history on file.  Social History Social History   Tobacco Use  . Smoking status: Current Every Day Smoker    Packs/day: 0.30    Types: Cigarettes  . Smokeless tobacco: Never Used  Substance Use Topics  . Alcohol use: Not Currently    Comment: at least a beer a day  . Drug use: Not Currently     Allergies   Bee venom and Shellfish allergy   Review of Systems Review of Systems  Constitutional: Negative for fever.  All other systems reviewed and are negative.  Physical Exam Updated Vital Signs BP 133/76   Pulse 88   Temp 99.7 F (37.6 C) (Oral)   Resp 17   SpO2 100%   Physical Exam Vitals signs and nursing note reviewed.  Constitutional:      General: He is not in acute distress.    Appearance: Normal appearance. He is well-developed.  HENT:     Head: Normocephalic and atraumatic.  Eyes:     Conjunctiva/sclera: Conjunctivae normal.     Pupils: Pupils are equal, round, and reactive to light.  Neck:     Musculoskeletal: Normal range of motion and neck supple.  Cardiovascular:     Rate and Rhythm: Normal rate and regular rhythm.      Heart sounds: Normal heart sounds.  Pulmonary:     Effort: Pulmonary effort is normal. No respiratory distress.     Breath sounds: Normal breath sounds.  Abdominal:     General: There is no distension.     Palpations: Abdomen is soft.     Tenderness: There is no abdominal tenderness.  Genitourinary:    Comments: Patient is not tolerating significant exam  Fluctuance noted of left gluteal / perirectal area  Musculoskeletal: Normal range of motion.        General: No deformity.  Skin:    General: Skin is warm and dry.  Neurological:     Mental Status: He is alert and oriented to person, place, and time.      ED Treatments / Results  Labs (all labs ordered are listed, but only abnormal results are displayed) Labs Reviewed  CBC WITH DIFFERENTIAL/PLATELET - Abnormal; Notable for the following components:      Result Value   Neutro Abs 8.4 (*)    All other components within normal limits  BASIC METABOLIC PANEL    EKG None  Radiology Ct Pelvis W Contrast  Result Date: 08/06/2019 CLINICAL DATA:  Perirectal abscess EXAM: CT PELVIS WITH CONTRAST TECHNIQUE: Multidetector CT imaging of the pelvis was performed using the standard protocol following the bolus administration of intravenous contrast. Patient scanned prone due to the significant discomfort. CONTRAST:  158mL OMNIPAQUE IOHEXOL 300 MG/ML  SOLN COMPARISON:  CT 02/01/2018, 05/21/2019 FINDINGS: Urinary Tract: Distal ureters are unremarkable. Urinary bladder is largely decompressed at the time of exam and therefore poorly evaluated by CT imaging. Bowel: Mild distal rectal thickening and intersphincteric fat stranding (2/106). Vascular/Lymphatic: The distal aorta is normal caliber. Reactive lymph nodes present in the groin. No pathologically enlarged nodes. Reproductive:  The prostate and seminal vesicles are unremarkable. Other: There is a rim enhancing, centrally hypoattenuating collection in the subcutaneous fat of the left gluteal  cleft which measures 3.9 x 2 x 3.9 cm in size (9/111, 2/133). Collection tracks superiorly into the ischioanal fossa and appears to demonstrate a low, transsphincteric extension. There is surrounding subcutaneous phlegmonous change and superficial skin thickening. Musculoskeletal: Inflammatory features track adjacent to the pelvic floor and gluteal musculature without intramuscular extension or abscess formation. No suspicious osseous lesions. IMPRESSION: 3.9 cm left gluteal abscess with stranding extending to the low rectum and intersphincteric inflammatory features suggesting transsphincteric extension. Overall collection is decreased in size since most recent comparisons. Electronically Signed   By: Lovena Le M.D.   On: 08/06/2019 22:34    Procedures Procedures (including critical care time)  Medications Ordered in ED Medications  HYDROmorphone (DILAUDID) injection 1 mg (1 mg Intravenous Given 08/07/19 1327)     Initial Impression / Assessment and Plan / ED Course  I have reviewed the triage vital signs and the nursing notes.  Pertinent labs & imaging results that were available during my care of the patient were reviewed by me and considered in my medical decision making (see chart for details).        MDM  Screen complete  Victor InfanteUrban Magnussen was evaluated in Emergency Department on 08/07/2019 for the symptoms described in the history of present illness. He was evaluated in the context of the global COVID-19 pandemic, which necessitated consideration that the patient might be at risk for infection with the SARS-CoV-2 virus that causes COVID-19. Institutional protocols and algorithms that pertain to the evaluation of patients at risk for COVID-19 are in a state of rapid change based on information released by regulatory bodies including the CDC and federal and state organizations. These policies and algorithms were followed during the patient's care in the ED.   Patient is presenting for  evaluation of rectal pain.  Patient was diagnosed with perirectal abscess last night and advised to return today for I and D in OR.   Surgery aware of case and will evaluate for admission.   Final Clinical Impressions(s) / ED Diagnoses   Final diagnoses:  Perirectal abscess    ED Discharge Orders    None       Wynetta FinesMessick, Jewelz Kobus C, MD 08/07/19 1355

## 2019-08-07 NOTE — Anesthesia Preprocedure Evaluation (Deleted)
Anesthesia Evaluation  Patient identified by MRN, date of birth, ID band Patient awake    Reviewed: Allergy & Precautions, NPO status , Patient's Chart, lab work & pertinent test results  History of Anesthesia Complications Negative for: history of anesthetic complications  Airway Mallampati: I  TM Distance: >3 FB Neck ROM: Full    Dental  (+) Dental Advisory Given   Pulmonary COPD,  COPD inhaler, Current Smoker and Patient abstained from smoking.,  08/07/2019 SARS coronavirus NEG   breath sounds clear to auscultation       Cardiovascular negative cardio ROS   Rhythm:Regular Rate:Normal     Neuro/Psych negative neurological ROS     GI/Hepatic negative GI ROS, Neg liver ROS,   Endo/Other  negative endocrine ROS  Renal/GU negative Renal ROS     Musculoskeletal   Abdominal   Peds  Hematology negative hematology ROS (+)   Anesthesia Other Findings   Reproductive/Obstetrics                            Anesthesia Physical Anesthesia Plan  ASA: II  Anesthesia Plan: General   Post-op Pain Management:    Induction: Intravenous  PONV Risk Score and Plan: 0 and Ondansetron  Airway Management Planned: Natural Airway and Simple Face Mask  Additional Equipment:   Intra-op Plan:   Post-operative Plan:   Informed Consent: I have reviewed the patients History and Physical, chart, labs and discussed the procedure including the risks, benefits and alternatives for the proposed anesthesia with the patient or authorized representative who has indicated his/her understanding and acceptance.     Dental advisory given  Plan Discussed with: CRNA and Surgeon  Anesthesia Plan Comments:         Anesthesia Quick Evaluation

## 2019-08-07 NOTE — ED Notes (Signed)
Patient's belongings locked up in TCU in locker 26 by this RN. Patient's belongings include his clothes, shoes, cell phone, a decorative pillow and some medications.

## 2019-08-08 ENCOUNTER — Encounter (HOSPITAL_COMMUNITY): Admission: EM | Disposition: A | Discharge status: Home / Self Care | Payer: Self-pay | Source: Home / Self Care

## 2019-08-08 ENCOUNTER — Encounter (HOSPITAL_COMMUNITY): Payer: Self-pay | Admitting: Anesthesiology

## 2019-08-08 ENCOUNTER — Inpatient Hospital Stay (HOSPITAL_COMMUNITY): Payer: BC Managed Care – PPO | Admitting: Anesthesiology

## 2019-08-08 HISTORY — PX: EVALUATION UNDER ANESTHESIA WITH HEMORRHOIDECTOMY: SHX5624

## 2019-08-08 HISTORY — PX: PLACEMENT OF SETON: SHX6029

## 2019-08-08 LAB — CBC
HCT: 37.5 % — ABNORMAL LOW (ref 39.0–52.0)
Hemoglobin: 12.2 g/dL — ABNORMAL LOW (ref 13.0–17.0)
MCH: 30 pg (ref 26.0–34.0)
MCHC: 32.5 g/dL (ref 30.0–36.0)
MCV: 92.1 fL (ref 80.0–100.0)
Platelets: 251 10*3/uL (ref 150–400)
RBC: 4.07 MIL/uL — ABNORMAL LOW (ref 4.22–5.81)
RDW: 12.8 % (ref 11.5–15.5)
WBC: 11.5 10*3/uL — ABNORMAL HIGH (ref 4.0–10.5)
nRBC: 0 % (ref 0.0–0.2)

## 2019-08-08 LAB — SURGICAL PCR SCREEN
MRSA, PCR: NEGATIVE
Staphylococcus aureus: POSITIVE — AB

## 2019-08-08 LAB — BASIC METABOLIC PANEL
Anion gap: 12 (ref 5–15)
BUN: 8 mg/dL (ref 6–20)
CO2: 25 mmol/L (ref 22–32)
Calcium: 9.1 mg/dL (ref 8.9–10.3)
Chloride: 96 mmol/L — ABNORMAL LOW (ref 98–111)
Creatinine, Ser: 1.14 mg/dL (ref 0.61–1.24)
GFR calc Af Amer: >60 mL/min (ref >60)
GFR calc non Af Amer: >60 mL/min (ref >60)
Glucose, Bld: 106 mg/dL — ABNORMAL HIGH (ref 70–99)
Potassium: 3.1 mmol/L — ABNORMAL LOW (ref 3.5–5.1)
Sodium: 133 mmol/L — ABNORMAL LOW (ref 135–145)

## 2019-08-08 LAB — HIV ANTIBODY (ROUTINE TESTING W REFLEX): HIV Screen 4th Generation wRfx: NONREACTIVE

## 2019-08-08 SURGERY — INCISION AND DRAINAGE, ABSCESS, PERIRECTAL
Anesthesia: General

## 2019-08-08 SURGERY — EXAM UNDER ANESTHESIA WITH HEMORRHOIDECTOMY
Anesthesia: General | Site: Perineum

## 2019-08-08 MED ORDER — SODIUM CHLORIDE (PF) 0.9 % IJ SOLN
INTRAMUSCULAR | Status: DC | PRN
  Administered 2019-08-08: 14 mL

## 2019-08-08 MED ORDER — LACTATED RINGERS IV SOLN
INTRAVENOUS | Status: DC
  Administered 2019-08-08 (×2): via INTRAVENOUS

## 2019-08-08 MED ORDER — MEPERIDINE HCL 50 MG/ML IJ SOLN: 6.2500 mg | INTRAMUSCULAR | Status: DC | PRN

## 2019-08-08 MED ORDER — PROPOFOL 10 MG/ML IV BOLUS
INTRAVENOUS | Status: DC | PRN
  Administered 2019-08-08: 200 mg via INTRAVENOUS

## 2019-08-08 MED ORDER — IBUPROFEN 400 MG PO TABS
400.0000 mg | ORAL_TABLET | Freq: Three times a day (TID) | ORAL | Status: DC
  Administered 2019-08-08: 400 mg via ORAL
  Filled 2019-08-08 (×2): qty 1

## 2019-08-08 MED ORDER — MIDAZOLAM HCL 2 MG/2ML IJ SOLN: 0.5000 mg | Freq: Once | INTRAMUSCULAR | Status: DC | PRN

## 2019-08-08 MED ORDER — IBUPROFEN 200 MG PO TABS: ORAL_TABLET | ORAL | Status: DC

## 2019-08-08 MED ORDER — OXYCODONE HCL 5 MG PO TABS
5.0000 mg | ORAL_TABLET | ORAL | Status: DC | PRN
  Administered 2019-08-08: 10 mg via ORAL
  Filled 2019-08-08: qty 2

## 2019-08-08 MED ORDER — PROMETHAZINE HCL 25 MG/ML IJ SOLN: 6.2500 mg | INTRAMUSCULAR | Status: DC | PRN

## 2019-08-08 MED ORDER — THERAPEUTIC MULTIVIT/MINERAL PO TABS: 1.0000 | ORAL_TABLET | Freq: Every day | ORAL | 2 refills | Status: DC

## 2019-08-08 MED ORDER — ROCURONIUM BROMIDE 10 MG/ML (PF) SYRINGE
PREFILLED_SYRINGE | INTRAVENOUS | Status: AC
  Filled 2019-08-08: qty 10

## 2019-08-08 MED ORDER — SUCCINYLCHOLINE CHLORIDE 200 MG/10ML IV SOSY
PREFILLED_SYRINGE | INTRAVENOUS | Status: AC
  Filled 2019-08-08: qty 10

## 2019-08-08 MED ORDER — MIDAZOLAM HCL 2 MG/2ML IJ SOLN
INTRAMUSCULAR | Status: AC
  Filled 2019-08-08: qty 2

## 2019-08-08 MED ORDER — KCL IN DEXTROSE-NACL 40-5-0.9 MEQ/L-%-% IV SOLN
INTRAVENOUS | Status: DC
  Administered 2019-08-08: 08:00:00 via INTRAVENOUS
  Filled 2019-08-08: qty 1000

## 2019-08-08 MED ORDER — OXYCODONE HCL 5 MG PO TABS: 5.0000 mg | ORAL_TABLET | Freq: Four times a day (QID) | ORAL | 0 refills | Status: DC | PRN

## 2019-08-08 MED ORDER — PROPOFOL 10 MG/ML IV BOLUS
INTRAVENOUS | Status: AC
  Filled 2019-08-08: qty 20

## 2019-08-08 MED ORDER — FENTANYL CITRATE (PF) 250 MCG/5ML IJ SOLN
INTRAMUSCULAR | Status: AC
  Filled 2019-08-08: qty 5

## 2019-08-08 MED ORDER — ACETAMINOPHEN 500 MG PO TABS
1000.0000 mg | ORAL_TABLET | Freq: Three times a day (TID) | ORAL | Status: DC
  Administered 2019-08-08 (×2): 1000 mg via ORAL
  Filled 2019-08-08 (×2): qty 2

## 2019-08-08 MED ORDER — LIDOCAINE 2% (20 MG/ML) 5 ML SYRINGE
INTRAMUSCULAR | Status: DC | PRN
  Administered 2019-08-08: 80 mg via INTRAVENOUS

## 2019-08-08 MED ORDER — MIDAZOLAM HCL 5 MG/5ML IJ SOLN
INTRAMUSCULAR | Status: DC | PRN
  Administered 2019-08-08: 2 mg via INTRAVENOUS

## 2019-08-08 MED ORDER — BUPIVACAINE-EPINEPHRINE 0.5% -1:200000 IJ SOLN
INTRAMUSCULAR | Status: DC | PRN
  Administered 2019-08-08: 14 mL

## 2019-08-08 MED ORDER — DEXAMETHASONE SODIUM PHOSPHATE 10 MG/ML IJ SOLN
INTRAMUSCULAR | Status: DC | PRN
  Administered 2019-08-08: 10 mg via INTRAVENOUS

## 2019-08-08 MED ORDER — FENTANYL CITRATE (PF) 100 MCG/2ML IJ SOLN
INTRAMUSCULAR | Status: DC | PRN
  Administered 2019-08-08: 100 ug via INTRAVENOUS
  Administered 2019-08-08 (×6): 25 ug via INTRAVENOUS

## 2019-08-08 MED ORDER — KCL IN DEXTROSE-NACL 40-5-0.9 MEQ/L-%-% IV SOLN
INTRAVENOUS | Status: DC
  Administered 2019-08-08 – 2019-08-09 (×2): via INTRAVENOUS
  Filled 2019-08-08 (×3): qty 1000

## 2019-08-08 MED ORDER — ACETAMINOPHEN 500 MG PO TABS: ORAL_TABLET | ORAL | 0 refills | Status: DC

## 2019-08-08 MED ORDER — HYDROMORPHONE HCL 1 MG/ML IJ SOLN
0.2500 mg | INTRAMUSCULAR | Status: DC | PRN
  Administered 2019-08-08 (×2): 0.5 mg via INTRAVENOUS

## 2019-08-08 MED ORDER — HYDROMORPHONE HCL 1 MG/ML IJ SOLN
INTRAMUSCULAR | Status: AC
  Filled 2019-08-08: qty 2

## 2019-08-08 MED ORDER — SODIUM CHLORIDE (PF) 0.9 % IJ SOLN
INTRAMUSCULAR | Status: AC
  Filled 2019-08-08: qty 20

## 2019-08-08 SURGICAL SUPPLY — 30 items
BNDG GAUZE ELAST 4 BULKY (GAUZE/BANDAGES/DRESSINGS) ×3 IMPLANT
BRIEF STRETCH FOR OB PAD LRG (UNDERPADS AND DIAPERS) ×3 IMPLANT
COVER WAND RF STERILE (DRAPES) IMPLANT
DECANTER SPIKE VIAL GLASS SM (MISCELLANEOUS) ×3 IMPLANT
DRSG PAD ABDOMINAL 8X10 ST (GAUZE/BANDAGES/DRESSINGS) ×3 IMPLANT
ELECT REM PT RETURN 15FT ADLT (MISCELLANEOUS) ×3 IMPLANT
GAUZE SPONGE 4X4 12PLY STRL (GAUZE/BANDAGES/DRESSINGS) IMPLANT
GLOVE BIO SURGEON STRL SZ7.5 (GLOVE) ×3 IMPLANT
GLOVE INDICATOR 8.0 STRL GRN (GLOVE) ×3 IMPLANT
GOWN STRL REUS W/TWL XL LVL3 (GOWN DISPOSABLE) ×6 IMPLANT
KIT BASIN OR (CUSTOM PROCEDURE TRAY) ×3 IMPLANT
KIT TURNOVER KIT A (KITS) IMPLANT
LOOP VESSEL MAXI BLUE (MISCELLANEOUS) ×3 IMPLANT
NEEDLE HYPO 22GX1.5 SAFETY (NEEDLE) ×3 IMPLANT
PACK GENERAL/GYN (CUSTOM PROCEDURE TRAY) ×3 IMPLANT
SHEARS HARMONIC 9CM CVD (BLADE) IMPLANT
SPONGE SURGIFOAM ABS GEL 100 (HEMOSTASIS) IMPLANT
SURGILUBE 2OZ TUBE FLIPTOP (MISCELLANEOUS) ×3 IMPLANT
SUT CHROMIC 2 0 SH (SUTURE) IMPLANT
SUT CHROMIC 3 0 SH 27 (SUTURE) IMPLANT
SUT MNCRL AB 4-0 PS2 18 (SUTURE) ×3 IMPLANT
SUT SILK 0 (SUTURE) ×1
SUT SILK 0 30XBRD TIE 6 (SUTURE) ×2 IMPLANT
SUT VIC AB 2-0 SH 27 (SUTURE) ×2
SUT VIC AB 2-0 SH 27X BRD (SUTURE) ×4 IMPLANT
SUT VIC AB 3-0 SH 27 (SUTURE) ×1
SUT VIC AB 3-0 SH 27X BRD (SUTURE) ×2 IMPLANT
SYR 20ML LL LF (SYRINGE) ×3 IMPLANT
TOWEL OR 17X26 10 PK STRL BLUE (TOWEL DISPOSABLE) ×3 IMPLANT
TOWEL OR NON WOVEN STRL DISP B (DISPOSABLE) ×3 IMPLANT

## 2019-08-08 NOTE — Anesthesia Procedure Notes (Signed)
Procedure Name: LMA Insertion Date/Time: 08/08/2019 12:10 PM Performed by: Lavina Hamman, CRNA Pre-anesthesia Checklist: Patient identified, Emergency Drugs available, Suction available and Patient being monitored Patient Re-evaluated:Patient Re-evaluated prior to induction Oxygen Delivery Method: Circle System Utilized Preoxygenation: Pre-oxygenation with 100% oxygen Induction Type: IV induction Ventilation: Mask ventilation without difficulty LMA: LMA inserted LMA Size: 4.0 Number of attempts: 1 Airway Equipment and Method: Bite block Placement Confirmation: positive ETCO2 Tube secured with: Tape Dental Injury: Teeth and Oropharynx as per pre-operative assessment

## 2019-08-08 NOTE — Anesthesia Preprocedure Evaluation (Signed)
Anesthesia Evaluation  Patient identified by MRN, date of birth, ID band Patient awake    Reviewed: Allergy & Precautions, NPO status , Patient's Chart, lab work & pertinent test results  Airway Mallampati: I  TM Distance: >3 FB Neck ROM: Full    Dental   Pulmonary Current Smoker and Patient abstained from smoking.,    Pulmonary exam normal        Cardiovascular Normal cardiovascular exam     Neuro/Psych    GI/Hepatic   Endo/Other    Renal/GU      Musculoskeletal   Abdominal   Peds  Hematology   Anesthesia Other Findings   Reproductive/Obstetrics                             Anesthesia Physical Anesthesia Plan  ASA: II  Anesthesia Plan: General   Post-op Pain Management:    Induction: Intravenous  PONV Risk Score and Plan: 1 and Ondansetron and Treatment may vary due to age or medical condition  Airway Management Planned: LMA  Additional Equipment:   Intra-op Plan:   Post-operative Plan: Extubation in OR  Informed Consent: I have reviewed the patients History and Physical, chart, labs and discussed the procedure including the risks, benefits and alternatives for the proposed anesthesia with the patient or authorized representative who has indicated his/her understanding and acceptance.       Plan Discussed with: Surgeon and CRNA  Anesthesia Plan Comments:         Anesthesia Quick Evaluation

## 2019-08-08 NOTE — Op Note (Signed)
08/08/2019  1:13 PM  PATIENT:  Victor Burns  28 y.o. male  Patient Care Team: Patient, No Pcp Per as PCP - General (General Practice) Patient, No Pcp Per (General Practice)  PRE-OPERATIVE DIAGNOSIS: Recurrent gluteal abscess  POST-OPERATIVE DIAGNOSIS:   1. Recurrent perirectal abscess 2. Anal fistula  PROCEDURE:   1. Incision and drainage of perirectal abscess 2. Placement of seton  SURGEON:  Surgeon(s): Andria MeuseWhite, Kemet Nijjar M, MD  ASSISTANT: none   ANESTHESIA:   local and general  SPECIMEN:  No Specimen  DISPOSITION OF SPECIMEN:  N/A  COUNTS:  Sponge, needle, and instrument counts were reported correct x2 at conclusion.  EBL: 30 mL  Drains: None  PLAN OF CARE: Discharge to home after PACU  PATIENT DISPOSITION:  PACU - hemodynamically stable.  INDICATION: [name] is a 27yoM with hx of recurrent perianal abscesses. He has undergone drainage of these before. He returned to ED with similar symptoms. He was found to have a large abscess in the left buttock.  He had left the emergency room but then re-presented yesterday for "surgery."  He was admitted to hospital and started antibiotics.  He had normal Dravin Lance blood cell count and was afebrile.  We discussed options moving forward.  He opted to pursue anorectal exam under anesthesia, incision and drainage of abscess, and possible placement of seton if the fistula is able to be identified.  OR FINDINGS: Left gluteal abscess which was spontaneously draining.  Cultures were obtained.  The wound was opened further and all loculations broken free.  Significant amount of granulation tissue present suggesting this is a rather chronic process.  A fistula was identified that seem to be somewhat superficial but at least involving the external sphincter apparatus.  This was just distal to the dentate line just off to the left of the anterior midline.  Given the findings of some muscle involvement on initial partial fistulotomy, a blue  vessel loop seton was placed without dividing any sphincter muscle.  DESCRIPTION: The patient was identified in the preoperative holding area and taken to the OR where he was placed on the operating room table. SCDs were placed.  He then underwent general anesthesia without difficulty. The patient was then positioned in lithotomy with Allen stirrups. He was then prepped and draped in usual sterile fashion.  A surgical timeout was performed indicating the correct patient, procedure, positioning and need for preoperative antibiotics.  A rectal block was performed using Marcaine with epinephrine.  A well lubricated digital rectal exam was performed which demonstrated no palpable anal canal abnormalities.  He did have mixed internal/external hemorrhoids in all 3 columns..  A Hill-Ferguson anoscope was into the anal canal and circumferential inspection demonstrated a normal-appearing anal canal with internal hemorrhoids.  There were no ulcerations or masses.  The abscess was noted to be spontaneously draining from the left buttock.  There was scar tissue here consistent with prior procedures.  This was opened up and all pus evacuated.  Cultures were sent.  All loculations were then broken free.  A probable fistula cavity was identified extending towards the left anterior portion of the anal canal.  I was unable to identify clear fistulous tract from the side.  A crypt hook was placed in the anal canal and the internal opening was readily identified without any difficulty.  The tract was palpated and noted to be somewhat superficial in course.  A partial fistulotomy was begun by incising the skin overlying the fistulous tract.  There was a  fair amount of scar tissue from his prior procedures here.  This did appear to involve some of the external sphincter muscle so further fistulotomy was not attempted.  A loose, draining blue vessel loop seton was placed and secured to itself with 0 silk ties.  Hemostasis was then  confirmed the abscess cavity.  This was then packed with Kerlix, covered by 4 x 4's and all secured with mesh underwear.  He was awakened from anesthesia, extubated, and transferred to a stretcher for transport to PACU in satisfactory condition.  DISPOSITION: PACU in satisfactory condition.

## 2019-08-08 NOTE — Plan of Care (Signed)
  Problem: Education: Goal: Knowledge of General Education information will improve Description: Including pain rating scale, medication(s)/side effects and non-pharmacologic comfort measures Outcome: Progressing   Problem: Health Behavior/Discharge Planning: Goal: Ability to manage health-related needs will improve Outcome: Progressing   Problem: Coping: Goal: Level of anxiety will decrease Outcome: Progressing   Problem: Nutrition: Goal: Adequate nutrition will be maintained Outcome: Progressing   Problem: Pain Managment: Goal: General experience of comfort will improve Outcome: Progressing

## 2019-08-08 NOTE — Transfer of Care (Signed)
Immediate Anesthesia Transfer of Care Note  Patient: Victor Burns  Procedure(s) Performed: Procedure(s): EXAM UNDER ANESTHESIA WITH IRRIGATION AND DRAINAGE OF PERIRECTAL ABSCESS (N/A) PLACEMENT OF DRAINING SETON  Patient Location: PACU  Anesthesia Type:General  Level of Consciousness:  sedated, patient cooperative and responds to stimulation  Airway & Oxygen Therapy:Patient Spontanous Breathing and Patient connected to face mask oxgen  Post-op Assessment:  Report given to PACU RN and Post -op Vital signs reviewed and stable  Post vital signs:  Reviewed and stable  Last Vitals:  Vitals:   08/08/19 0922 08/08/19 1317  BP: 130/73 116/68  Pulse: 71 72  Resp: 18 15  Temp: 37.1 C 36.5 C  SpO2: 16% 109%    Complications: No apparent anesthesia complications

## 2019-08-08 NOTE — Progress Notes (Signed)
Subjective No acute events. Abscess still quite sore - some drainage over night.  Objective: Vital signs in last 24 hours: Temp:  [97.9 F (36.6 C)-99.8 F (37.7 C)] 98.7 F (37.1 C) (09/02 0922) Pulse Rate:  [71-91] 71 (09/02 0922) Resp:  [14-18] 18 (09/02 0922) BP: (116-148)/(64-81) 130/73 (09/02 0922) SpO2:  [94 %-100 %] 98 % (09/02 0922) Weight:  [90.7 kg] 90.7 kg (09/01 1703)    Intake/Output from previous day: 09/01 0701 - 09/02 0700 In: 536.3 [P.O.:240; I.V.:241; IV Piggyback:55.3] Out: 0  Intake/Output this shift: No intake/output data recorded.  Gen: NAD, comfortable CV: RRR Pulm: Normal work of breathing Abd: Soft, NT/ND Ext: SCDs in place  Lab Results: CBC  Recent Labs    08/07/19 1334 08/08/19 0513  WBC 10.5 11.5*  HGB 13.6 12.2*  HCT 41.3 37.5*  PLT 304 251   BMET Recent Labs    08/07/19 1334 08/08/19 0513  NA 136 133*  K 3.4* 3.1*  CL 99 96*  CO2 23 25  GLUCOSE 117* 106*  BUN 8 8  CREATININE 1.18 1.14  CALCIUM 9.6 9.1   PT/INR No results for input(s): LABPROT, INR in the last 72 hours. ABG No results for input(s): PHART, HCO3 in the last 72 hours.  Invalid input(s): PCO2, PO2  Studies/Results:  Anti-infectives: Anti-infectives (From admission, onward)   Start     Dose/Rate Route Frequency Ordered Stop   08/07/19 1600  [MAR Hold]  piperacillin-tazobactam (ZOSYN) IVPB 3.375 g     (MAR Hold since Wed 08/08/2019 at 1031.Hold Reason: Transfer to a Procedural area.)   3.375 g 12.5 mL/hr over 240 Minutes Intravenous Every 8 hours 08/07/19 1437         Assessment/Plan: Patient Active Problem List   Diagnosis Date Noted  . Perirectal abscess s/p I&D 06/16/2018 06/16/2018  . Alcohol abuse 06/16/2018  . Mild persistent asthma 10/07/2017   Mr. Pannone is a 8yoM with hx of recurrent perianal/perirectal abscesses. He has undergone drainage of these before - now with recurrence  -Incision/drainage of perirectal abscess, anorectal exam  under anesthesia, possible seton placement today. This has been reviewed with him again today. -The planned procedure, material risks (including, but not limited to, pain, bleeding, infection, scarring, need for blood transfusion, damage to anal sphincter, incontinence of gas and/or stool, need for additional procedures, recurrence, pneumonia, heart attack, stroke, death) benefits and alternatives to surgery were discussed at length. I noted a good probability that the procedure would help improve his symptoms. The patient's questions were answered to his satisfaction, he voiced understanding and elected to proceed with surgery. Additionally, we discussed typical postoperative expectations and the recovery process.   LOS: 1 day   Sharon Mt. Dema Severin, M.D. [facility]

## 2019-08-08 NOTE — Discharge Instructions (Signed)
WOUND CARE  It is important that the wound be kept open.   -Keeping the skin edges apart will allow the wound to gradually heal from the base upwards.   - If the skin edges of the wound close too early, a new fluid pocket can form and infection can occur. -This is why drained wounds cannot be sewed closed right away  A healthy wound should form a lining of bright red "beefy" granulating tissue that will help shrink the wound and help the edges grow new skin into it.   -A little mucus / yellow discharge is normal (the body's natural way to try and form a scab) and should be gently washed off with soap and water with daily dressing changes.  -Green or foul smelling drainage implies bacterial colonization and can slow wound healing - a short course of antibiotic ointment (3-5 days) can help it clear up.  Call the doctor if it does not improve or worsens  -Avoid use of antibiotic ointments for more than a week as they can slow wound healing over time.    -Sometimes other wound care products will be used to reduce need for dressing changes and/or help clean up dirty wounds -Sometimes the surgeon needs to debride the wound in the office to remove dead or infected tissue out of the wound so it can heal more quickly and safely.    Change the dressing at least twice a day -Wash the wound with mild soap and water gently every day.  It is good to shower or bathe the wound to help it clean out. -A dry wound will take longer to heal.  -Keep the skin dry around the wound to prevent breakdown and irritation.  -Cover with a clean gauze and tape -paper or Medipore tape tend to be gentle on the skin -rotate the orientation of the tape to avoid repeated stress/trauma on the skin   Complete all antibiotics through the entire prescription to help the infection heal and prevent new places of infection   Returning the see the surgeon is helpful to follow the healing process and help the wound close as fast as  possible. WOUND CARE  It is important that the wound be kept open.   -Keeping the skin edges apart will allow the wound to gradually heal from the base upwards.   - If the skin edges of the wound close too early, a new fluid pocket can form and infection can occur. -This is the reason to pack deeper wounds with gauze or ribbon -This is why drained wounds cannot be sewed closed right away  A healthy wound should form a lining of bright red "beefy" granulating tissue that will help shrink the wound and help the edges grow new skin into it.   -A little mucus / yellow discharge is normal (the body's natural way to try and form a scab) and should be gently washed off with soap and water with daily dressing changes.  -Green or foul smelling drainage implies bacterial colonization and can slow wound healing - a short course of antibiotic ointment (3-5 days) can help it clear up.  Call the doctor if it does not improve or worsens  -Avoid use of antibiotic ointments for more than a week as they can slow wound healing over time.    -Sometimes other wound care products will be used to reduce need for dressing changes and/or help clean up dirty wounds -Sometimes the surgeon needs to debride the wound in  the office to remove dead or infected tissue out of the wound so it can heal more quickly and safely.    Change the dressing at least once a day -Wash the wound with mild soap and water gently every day.  It is good to shower or bathe the wound to help it clean out. -Use clean 4x4 gauze for medium/large wounds or ribbon plain NU-gauze for smaller wounds (it does not need to be sterile, just clean) -Keep the raw wound moist with a little saline or KY (saline) gel on the gauze.  -A dry wound will take longer to heal.  -Keep the skin dry around the wound to prevent breakdown and irritation. -Pack the wound down to the base -The goal is to keep the skin apart, not overpack the wound -Use a Q-tip or  blunt-tipped kabob stick toothpick to push the gauze down to the base in narrow or deep wounds   -Cover with a clean gauze and tape -paper or Medipore tape tend to be gentle on the skin -rotate the orientation of the tape to avoid repeated stress/trauma on the skin -using an ACE or Coban wrap on wounds on arms or legs can be used instead.  Complete all antibiotics through the entire prescription to help the infection heal and prevent new places of infection   Returning the see the surgeon is helpful to follow the healing process and help the wound close as fast as possible.   Anorectal Abscess Leave seton in place.  Clean the area with soap and water as you normally would.  Clean the open site in your rectum with soap and water.  Stiz bath 2-3 times per day to clean site and for discomfort.   You can keep a pad over the site at home to protect your clothing.   An abscess is an infected area that contains a collection of pus. An anorectal abscess is an abscess that is near the opening of the anus or around the rectum. Without treatment, an anorectal abscess can become larger and cause other problems, such as a more serious body-wide infection or pain, especially during bowel movements. What are the causes? This condition is caused by plugged glands or an infection in one of these areas:  The anus.  The area between the anus and the scrotum in males or between the anus and the vagina in females (perineum). What increases the risk? The following factors may make you more likely to develop this condition:  Diabetes or inflammatory bowel disease.  Having a body defense system (immune system) that is weak.  Engaging in anal sex.  Having a sexually transmitted infection (STI).  Certain kinds of cancer, such as rectal carcinoma, leukemia, or lymphoma. What are the signs or symptoms? The main symptom of this condition is pain. The pain may be a throbbing pain that gets worse during bowel  movements. Other symptoms include:  Swelling and redness in the area of the abscess. The redness may go beyond the abscess and appear as a red streak on the skin.  A visible, painful lump, or a lump that can be felt when touched.  Bleeding or pus-like discharge from the area.  Fever.  General weakness.  Constipation.  Diarrhea. How is this diagnosed? This condition is diagnosed based on your medical history and a physical exam of the affected area.  This may involve examining the rectal area with a gloved hand (digital rectal exam).  Sometimes, the health care provider needs to  look into the rectum using a probe, scope, or imaging test.  For women, it may require a careful vaginal exam. How is this treated? Treatment for this condition may include:  Incision and drainage surgery. This involves making an incision over the abscess to drain the pus.  Medicines, including antibiotic medicine, pain medicine, stool softeners, or laxatives. Follow these instructions at home: Medicines  Take over-the-counter and prescription medicines only as told by your health care provider.  If you were prescribed an antibiotic medicine, use it as told by your health care provider. Do not stop using the antibiotic even if you start to feel better.  Do not drive or use heavy machinery while taking prescription pain medicine. Wound care   If gauze was used in the abscess, follow instructions from your health care provider about removing or changing the gauze. It can usually be removed in 2-3 days.  Wash your hands with soap and water before you remove or change your gauze. If soap and water are not available, use hand sanitizer.  If one or more drains were placed in the abscess cavity, be careful not to pull at them. Your health care provider will tell you how long they need to remain in place.  Check your incision area every day for signs of infection. Check for: ? More redness, swelling, or  pain. ? More fluid or blood. ? Warmth. ? Pus or a bad smell. Managing pain, stiffness, and swelling   Take a sitz bath 3-4 times a day and after bowel movements. This will help reduce pain and swelling.  To relieve pain, try sitting: ? On a heating pad with the setting on low. ? On an inflatable donut-shaped cushion.  If directed, put ice on the affected area: ? Put ice in a plastic bag. ? Place a towel between your skin and the bag. ? Leave the ice on for 20 minutes, 2-3 times a day. General instructions  Follow any diet instructions given by your health care provider.  Keep all follow-up visits as told by your health care provider. This is important. Contact a health care provider if you have:  Bleeding from your incision.  Pain, swelling, or redness that does not improve or gets worse.  Trouble passing stool or urine.  Symptoms that return after treatment. Get help right away if you:  Have problems moving or using your legs.  Have severe or increasing pain.  Have swelling in the affected area that suddenly gets worse.  Have a large increase in bleeding or passing of pus.  Develop chills or a fever. Summary  An anorectal abscess is an abscess that is near the opening of the anus or around the rectum. An abscess is an infected area that contains a collection of pus.  The main symptom of this condition is pain. It may be a throbbing pain that gets worse during bowel movements.  Treatment for an anorectal abscess may include surgery to drain the pus from the abscess. Medicines and sitz baths may also be a part of your treatment plan. This information is not intended to replace advice given to you by your health care provider. Make sure you discuss any questions you have with your health care provider. Document Released: 11/19/2000 Document Revised: 12/29/2017 Document Reviewed: 12/29/2017 Elsevier Patient Education  2020 [employer].   How to Take a [employer] Do this 2-3 times per day at home to clean site and for comfort.   You can shower  after the Sitz bath, dry dressing over the open site to protect your site.   A sitz bath is a warm water bath that may be used to care for your rectum, genital area, or the area between your rectum and genitals (perineum). For a sitz bath, the water only comes up to your hips and covers your buttocks. A sitz bath may done at home in a bathtub or with a portable sitz bath that fits over the toilet. Your health care provider may recommend a sitz bath to help:  Relieve pain and discomfort after delivering a baby.  Relieve pain and itching from hemorrhoids or anal fissures.  Relieve pain after certain surgeries.  Relax muscles that are sore or tight. How to take a sitz bath Take 3-4 sitz baths a day, or as many as told by your health care provider. Bathtub sitz bath To take a sitz bath in a bathtub: 1. Partially fill a bathtub with warm water. The water should be deep enough to cover your hips and buttocks when you are sitting in the tub. 2. If your health care provider told you to put medicine in the water, follow his or her instructions. 3. Sit in the water. 4. Open the tub drain a little, and leave it open during your bath. 5. Turn on the warm water again, enough to replace the water that is draining out. Keep the water running throughout your bath. This helps keep the water at the right level and the right temperature. 6. Soak in the water for 15-20 minutes, or as long as told by your health care provider. 7. When you are done, be careful when you stand up. You may feel dizzy. 8. After the sitz bath, pat yourself dry. Do not rub your skin to dry it.  Over-the-toilet sitz bath To take a sitz bath with an over-the-toilet basin: 1. Follow the manufacturer's instructions. 2. Fill the basin with warm water. 3. If your health care provider told you to put medicine in the water, follow his or her  instructions. 4. Sit on the seat. Make sure the water covers your buttocks and perineum. 5. Soak in the water for 15-20 minutes, or as long as told by your health care provider. 6. After the sitz bath, pat yourself dry. Do not rub your skin to dry it. 7. Clean and dry the basin between uses. 8. Discard the basin if it cracks, or according to the manufacturer's instructions. Contact a health care provider if:  Your symptoms get worse. Do not continue with sitz baths if your symptoms get worse.  You have new symptoms. If this happens, do not continue with sitz baths until you talk with your health care provider. Summary  A sitz bath is a warm water bath in which the water only comes up to your hips and covers your buttocks.  A sitz bath may help relieve itching, relieve pain, and relax muscles that are sore or tight in the lower part of your body, including your genital area.  Take 3-4 sitz baths a day, or as many as told by your health care provider. Soak in the water for 15-20 minutes.  Do not continue with sitz baths if your symptoms get worse. This information is not intended to replace advice given to you by your health care provider. Make sure you discuss any questions you have with your health care provider. Document Released: 08/14/2004 Document Revised: 11/24/2017 Document Reviewed: 11/24/2017 Elsevier Patient Education  2020 [employer].  Alcohol Use Disorder You need to work on your drinking before your have a major medical issue.  Alcohol use disorder is when your drinking disrupts your daily life. When you have this condition, you drink too much alcohol and you cannot control your drinking. Alcohol use disorder can cause serious problems with your physical health. It can affect your brain, heart, liver, pancreas, immune system, stomach, and intestines. Alcohol use disorder can increase your risk for certain cancers and cause problems with your mental health, such as  depression, anxiety, psychosis, delirium, and dementia. People with this disorder risk hurting themselves and others. What are the causes? This condition is caused by drinking too much alcohol over time. It is not caused by drinking too much alcohol only one or two times. Some people with this condition drink alcohol to cope with or escape from negative life events. Others drink to relieve pain or symptoms of mental illness. What increases the risk? You are more likely to develop this condition if:  You have a family history of alcohol use disorder.  Your culture encourages drinking to the point of intoxication, or makes alcohol easy to get.  You had a mood or conduct disorder in childhood.  You have been a victim of abuse.  You are an adolescent and: ? You have poor grades or difficulties in school. ? Your caregivers do not talk to you about saying no to alcohol, or supervise your activities. ? You are impulsive or you have trouble with self-control. What are the signs or symptoms? Symptoms of this condition include:  Drinkingmore than you want to.  Drinking for longer than you want to.  Trying several times to drink less or to control your drinking.  Spending a lot of time getting alcohol, drinking, or recovering from drinking.  Craving alcohol.  Having problems at work, at school, or at home due to drinking.  Having problems in relationships due to drinking.  Drinking when it is dangerous to drink, such as before driving a car.  Continuing to drink even though you know you might have a physical or mental problem related to drinking.  Needing more and more alcohol to get the same effect you want from the alcohol (building up tolerance).  Having symptoms of withdrawal when you stop drinking. Symptoms of withdrawal include: ? Fatigue. ? Nightmares. ? Trouble sleeping. ? Depression. ? Anxiety. ? Fever. ? Seizures. ? Severe confusion. ? Feeling or seeing things that  are not there (hallucinations). ? Tremors. ? Rapid heart rate. ? Rapid breathing. ? High blood pressure.  Drinking to avoid symptoms of withdrawal. How is this diagnosed? This condition is diagnosed with an assessment. Your health care provider may start the assessment by asking three or four questions about your drinking. Your health care provider may perform a physical exam or do lab tests to see if you have physical problems resulting from alcohol use. She or he may refer you to a mental health professional for evaluation. How is this treated? Some people with alcohol use disorder are able to reduce their alcohol use to low-risk levels. Others need to completely quit drinking alcohol. When necessary, mental health professionals with specialized training in substance use treatment can help. Your health care provider can help you decide how severe your alcohol use disorder is and what type of treatment you need. The following forms of treatment are available:  Detoxification. Detoxification involves quitting drinking and using prescription medicines within the first week to help lessen withdrawal symptoms.  This treatment is important for people who have had withdrawal symptoms before and for heavy drinkers who are likely to have withdrawal symptoms. Alcohol withdrawal can be dangerous, and in severe cases, it can cause death. Detoxification may be provided in a home, community, or primary care setting, or in a hospital or substance use treatment facility.  Counseling. This treatment is also called talk therapy. It is provided by substance use treatment counselors. A counselor can address the reasons you use alcohol and suggest ways to keep you from drinking again or to prevent problem drinking. The goals of talk therapy are to: ? Find healthy activities and ways for you to cope with stress. ? Identify and avoid the things that trigger your alcohol use. ? Help you learn how to handle  cravings.  Medicines.Medicines can help treat alcohol use disorder by: ? Decreasing alcohol cravings. ? Decreasing the positive feeling you have when you drink alcohol. ? Causing an uncomfortable physical reaction when you drink alcohol (aversion therapy).  Support groups. Support groups are led by people who have quit drinking. They provide emotional support, advice, and guidance. These forms of treatment are often combined. Some people with this condition benefit from a combination of treatments provided by specialized substance use treatment centers. Follow these instructions at home:  Take over-the-counter and prescription medicines only as told by your health care provider.  Check with your health care provider before starting any new medicines.  Ask friends and family members not to offer you alcohol.  Avoid situations where alcohol is served, including gatherings where others are drinking alcohol.  Create a plan for what to do when you are tempted to use alcohol.  Find hobbies or activities that you enjoy that do not include alcohol.  Keep all follow-up visits as told by your health care provider. This is important. How is this prevented?  If you drink, limit alcohol intake to no more than 1 drink a day for nonpregnant women and 2 drinks a day for men. One drink equals 12 oz of beer, 5 oz of wine, or 1 oz of hard liquor.  If you have a mental health condition, get treatment and support.  Do not give alcohol to adolescents.  If you are an adolescent: ? Do not drink alcohol. ? Do not be afraid to say no if someone offers you alcohol. Speak up about why you do not want to drink. You can be a positive role model for your friends and set a good example for those around you by not drinking alcohol. ? If your friends drink, spend time with others who do not drink alcohol. Make new friends who do not use alcohol. ? Find healthy ways to manage stress and emotions, such as  meditation or deep breathing, exercise, spending time in nature, listening to music, or talking with a trusted friend or family member. Contact a health care provider if:  You are not able to take your medicines as told.  Your symptoms get worse.  You return to drinking alcohol (relapse) and your symptoms get worse. Get help right away if:  You have thoughts about hurting yourself or others. If you ever feel like you may hurt yourself or others, or have thoughts about taking your own life, get help right away. You can go to your nearest emergency department or call:  Your local emergency services (911 in the [place]  A suicide crisis helpline, such as the National Suicide Prevention Lifeline at (534) 181-93571-517 406 4905. This is open  24 hours a day. Summary  Alcohol use disorder is when your drinking disrupts your daily life. When you have this condition, you drink too much alcohol and you cannot control your drinking.  Treatment may include detoxification, counseling, medicine, and support groups.  Ask friends and family members not to offer you alcohol. Avoid situations where alcohol is served.  Get help right away if you have thoughts about hurting yourself or others. This information is not intended to replace advice given to you by your health care provider. Make sure you discuss any questions you have with your health care provider. Document Released: 12/30/2004 Document Revised: 11/04/2017 Document Reviewed: 08/19/2016   Substance Use Disorder Substance use disorder occurs when a person's repeated use of drugs or alcohol interferes with his or her ability to be productive. This disorder can cause problems with mental and physical health. It can affect your ability to have healthy relationships, and it can keep you from being able to meet your responsibilities at work, home, or school. It can also lead to addiction, which is a condition in which the person cannot stop using the substance  consistently for a period of time. Addiction changes the way the brain works. Because of these changes, addiction is a chronic condition. Substance use disorder can be mild, moderate, or severe. The most commonly abused substances include:  Alcohol.  Tobacco.  Marijuana.  Stimulants, such as cocaine and methamphetamine.  Hallucinogens, such as LSD and PCP.  Opioids, such as some prescription pain medicines and heroin. What are the causes? This condition may develop due to many complex social, psychological, or physical reasons, such as:  Stress.  Abuse.  Peer pressure.  Anxiety or depression. What increases the risk? This condition is more likely to develop in people who:  Use substances to cope with stress.  Have been abused.  Have a mental health disorder, such as depression.  Have a family history of substance use disorder. What are the signs or symptoms? Symptoms of this condition include:  Using the substance for longer periods of time or at a higher dosage than what is normal or intended.  Having a lasting desire to use the substance.  Being unable to slow down or stop the use of the substance.  Spending an abnormal amount of time getting the substance, using the substance, or recovering from using the substance.  Using the substance in a way that interferes with work, school, social activities, and personal relationships.  Using the substance even after having negative consequences, such as: ? Health problems. ? Legal or financial troubles. ? Job loss. ? Relationship problems.  Needing more and more of the substance to get the same effect (developing tolerance).  Experiencing unpleasant symptoms if you do not use the substance (withdrawal).  Using the substance to avoid withdrawal symptoms. How is this diagnosed? This condition may be diagnosed based on:  A physical exam.  Your history of substance use.  Your symptoms. This includes: ? How  substance use affects your life. ? Changes in personality, behaviors, and mood. ? Having at least two symptoms of substance use disorder within a 20-month period. ? Health issues related to substance use, such as liver damage, shortness of breath, fatigue, cough, or heart problems.  Blood or urine tests to screen for alcohol and drugs. How is this treated? This condition may be treated by:  Stopping substance use safely. This may require taking medicines and being closely monitored for several days.  Taking part in  group and individual counseling from mental health providers who help people with substance use disorder.  Staying at a live-in (residential) treatment center for several days or weeks.  Attending daily counseling sessions at a treatment center.  Taking medicine as told by your health care provider: ? To ease symptoms and prevent complications during withdrawal. ? To treat other mental health issues, such as depression or anxiety. ? To block cravings by causing the same effects as the substance. ? To block the effects of the substance or replace good sensations with unpleasant ones.  Participating in a support group to share your experience with others who are going through the same thing. These groups are an important part of long-term recovery for many people. Recovery can be a long process. Many people who undergo treatment start using the substance again after stopping (relapse). If you relapse, that does not mean that treatment will not work. Follow these instructions at home:   Take over-the-counter and prescription medicines only as told by your health care provider.  Do not use any drugs or alcohol.  Avoid temptations or triggers that you associate with your use of the substance.  Learn and practice techniques for managing stress.  Have a plan for vulnerable moments. Get phone numbers of people who are willing to help and who are committed to your  recovery.  Attend support groups on a regular basis. These groups include 12-step programs like Alcoholics Anonymous and Narcotics Anonymous.  Keep all follow-up visits as told by your health care providers. This is important. This includes continuing to work with therapists and support groups. Contact a health care provider if:  You cannot take your medicines as told.  Your symptoms get worse.  You have trouble resisting the urge to use drugs or alcohol. Get help right away if you:  Relapse.  Think that you may have taken too much of a drug. The hotline of the [facility] is 272-134-5908.  Have signs of an overdose. Symptoms include: ? Chest pain. ? Confusion. ? Sleepiness or difficulty staying awake. ? Slowed breathing. ? Nausea or vomiting. ? A seizure.  Have serious thoughts about hurting yourself or someone else. Drug overdose is an emergency. Do not wait to see if the symptoms will go away. Get medical help right away. Call your local emergency services (911 in the [place] Do not drive yourself to the hospital. If you ever feel like you may hurt yourself or others, or have thoughts about taking your own life, get help right away. You can go to your nearest emergency department or call:  Your local emergency services (911 in the [place]  A suicide crisis helpline, such as the National Suicide Prevention Lifeline at 304-149-3437. This is open 24 hours a day. Summary  Substance use disorder occurs when a person's repeated use of drugs or alcohol interferes with his or her ability to be productive.  Taking part in group and individual counseling from mental health providers is a common treatment for people with substance use disorder.  Recovery can be a long process. Many people who undergo treatment start using the substance again after stopping (relapse). A relapse does not mean that treatment will not work.  Attend support groups such as Alcoholics  Anonymous and Narcotics Anonymous. These groups are an important part of long-term recovery for many people. This information is not intended to replace advice given to you by your health care provider. Make sure you discuss any questions you  have with your health care provider. Document Released: 07/14/2005 Document Revised: 03/15/2019 Document Reviewed: 01/03/2018 Elsevier Patient Education  2020 [employer].  Elsevier Patient Education  [employer].

## 2019-08-08 NOTE — Anesthesia Postprocedure Evaluation (Signed)
Anesthesia Post Note  Patient: Humberto Addo  Procedure(s) Performed: EXAM UNDER ANESTHESIA WITH IRRIGATION AND DRAINAGE OF PERIRECTAL ABSCESS (N/A Perineum) PLACEMENT OF DRAINING SETON (Perineum)     Patient location during evaluation: PACU Anesthesia Type: General Level of consciousness: awake and alert Pain management: pain level controlled Vital Signs Assessment: post-procedure vital signs reviewed and stable Respiratory status: spontaneous breathing, nonlabored ventilation, respiratory function stable and patient connected to nasal cannula oxygen Cardiovascular status: blood pressure returned to baseline and stable Postop Assessment: no apparent nausea or vomiting Anesthetic complications: no    Last Vitals:  Vitals:   08/08/19 1430 08/08/19 1444  BP:  127/77  Pulse: 70 75  Resp: 16 14  Temp:  36.5 C  SpO2: 100% 100%    Last Pain:  Vitals:   08/08/19 1444  TempSrc: Oral  PainSc:                  Jazyiah Yiu DAVID

## 2019-08-09 ENCOUNTER — Inpatient Hospital Stay (HOSPITAL_COMMUNITY): Payer: BC Managed Care – PPO

## 2019-08-09 ENCOUNTER — Encounter (HOSPITAL_COMMUNITY): Payer: Self-pay | Admitting: Surgery

## 2019-08-09 LAB — BASIC METABOLIC PANEL
Anion gap: 10 (ref 5–15)
BUN: 8 mg/dL (ref 6–20)
CO2: 28 mmol/L (ref 22–32)
Calcium: 9.3 mg/dL (ref 8.9–10.3)
Chloride: 101 mmol/L (ref 98–111)
Creatinine, Ser: 0.89 mg/dL (ref 0.61–1.24)
GFR calc Af Amer: >60 mL/min (ref >60)
GFR calc non Af Amer: >60 mL/min (ref >60)
Glucose, Bld: 128 mg/dL — ABNORMAL HIGH (ref 70–99)
Potassium: 3.7 mmol/L (ref 3.5–5.1)
Sodium: 139 mmol/L (ref 135–145)

## 2019-08-09 LAB — CBC
HCT: 37.5 % — ABNORMAL LOW (ref 39.0–52.0)
Hemoglobin: 12.1 g/dL — ABNORMAL LOW (ref 13.0–17.0)
MCH: 29.9 pg (ref 26.0–34.0)
MCHC: 32.3 g/dL (ref 30.0–36.0)
MCV: 92.6 fL (ref 80.0–100.0)
Platelets: 276 10*3/uL (ref 150–400)
RBC: 4.05 MIL/uL — ABNORMAL LOW (ref 4.22–5.81)
RDW: 12.3 % (ref 11.5–15.5)
WBC: 11.2 10*3/uL — ABNORMAL HIGH (ref 4.0–10.5)
nRBC: 0 % (ref 0.0–0.2)

## 2019-08-09 MED ORDER — IBUPROFEN 400 MG PO TABS
600.0000 mg | ORAL_TABLET | Freq: Three times a day (TID) | ORAL | Status: DC
  Administered 2019-08-09: 600 mg via ORAL
  Filled 2019-08-09: qty 1

## 2019-08-09 MED ORDER — PROMETHAZINE HCL 25 MG/ML IJ SOLN: 12.5000 mg | Freq: Four times a day (QID) | INTRAMUSCULAR | Status: DC | PRN

## 2019-08-09 MED ORDER — PROMETHAZINE HCL 25 MG/ML IJ SOLN
25.0000 mg | Freq: Four times a day (QID) | INTRAMUSCULAR | Status: DC | PRN
  Administered 2019-08-09: 25 mg via INTRAVENOUS
  Filled 2019-08-09: qty 1

## 2019-08-09 MED ORDER — DIPHENHYDRAMINE HCL 25 MG PO CAPS: 25.0000 mg | ORAL_CAPSULE | Freq: Four times a day (QID) | ORAL | Status: DC | PRN

## 2019-08-09 MED ORDER — ONDANSETRON HCL 4 MG/2ML IJ SOLN
4.0000 mg | Freq: Once | INTRAMUSCULAR | Status: AC
  Administered 2019-08-09: 4 mg via INTRAVENOUS

## 2019-08-09 MED ORDER — ACETAMINOPHEN 500 MG PO TABS: 1000.0000 mg | ORAL_TABLET | Freq: Three times a day (TID) | ORAL | Status: DC

## 2019-08-09 MED ORDER — ONDANSETRON HCL 4 MG PO TABS: 4.0000 mg | ORAL_TABLET | Freq: Four times a day (QID) | ORAL | 0 refills | Status: DC

## 2019-08-09 MED ORDER — DIPHENHYDRAMINE HCL 50 MG/ML IJ SOLN: 25.0000 mg | Freq: Four times a day (QID) | INTRAMUSCULAR | Status: DC | PRN

## 2019-08-09 MED ORDER — PANTOPRAZOLE SODIUM 40 MG PO TBEC
80.0000 mg | DELAYED_RELEASE_TABLET | Freq: Every day | ORAL | Status: DC
  Administered 2019-08-09: 80 mg via ORAL
  Filled 2019-08-09: qty 2

## 2019-08-09 MED ORDER — NICOTINE 14 MG/24HR TD PT24
14.0000 mg | MEDICATED_PATCH | Freq: Every day | TRANSDERMAL | Status: DC
  Administered 2019-08-09: 14 mg via TRANSDERMAL
  Filled 2019-08-09: qty 1

## 2019-08-09 NOTE — Plan of Care (Signed)
RN reviewed discharge instructions with patient. All questions answered.   Paperwork and prescriptions given.   NT rolled patient down with all belongings to family car.  

## 2019-08-09 NOTE — Progress Notes (Signed)
Patient reports eating a small amount of noodles and steak tips, started vomiting shortly after consuming food. Continues with hiccups, nausea and vomiting after given antiemetics. Emesis: bile, brown and dark red Level of consciousness: alert and oriented during conversation and states "I was hallucinating that someone was in my room".    Interventions:  On call physician for [facility] Surgery notified, CIWA Q6, NGT placed, abd xray and antiemetic.

## 2019-08-09 NOTE — Progress Notes (Addendum)
Pt tolerating NG being clamped.  I pulled the NG and advanced him to full liquids.  If he does well he can go later this PM which is what he would like to do.   ABD:   is soft, nontender. + BS  We reviewed the discharge instructions for wound care.   I also again recommended he see someone about his substance use.    4:15PM Doing well waiting on some food from downstairs, feels fine.  Plan DC later this PM.  Staff will call if there is an issue.

## 2019-08-09 NOTE — Progress Notes (Addendum)
1 Day Post-Op  Subjective: CC: N/V Reports pain of his buttock is mild and rates as a 3/10.  Much more controlled than prior to surgery.  Did develop some nausea and vomiting last night.  Had some epigastric fullness but denies any abdominal pain.  NG tube was placed yesterday.  Currently without any nausea, abdominal pain.  He is passing flatus.   Objective: Vital signs in last 24 hours: Temp:  [97.5 F (36.4 C)-98.7 F (37.1 C)] 97.5 F (36.4 C) (09/03 0214) Pulse Rate:  [58-80] 70 (09/03 0603) Resp:  [11-18] 17 (09/03 0603) BP: (100-132)/(52-81) 123/69 (09/03 0603) SpO2:  [97 %-100 %] 100 % (09/03 0603) Last BM Date: 08/05/19  Intake/Output from previous day: 09/02 0701 - 09/03 0700 In: 1678.1 [P.O.:240; I.V.:1292.7; IV Piggyback:145.5] Out: 2370 [Urine:1550; Emesis/NG output:800; Blood:20] Intake/Output this shift: No intake/output data recorded.  PE: Gen:  Alert, NAD, pleasant HEENT: NGT in place with 800cc brown liquid in cannister Lungs: Normal rate and effort Abd: Soft, NT/ND, +BS GU: Chaperone present.  Left gluteal abscess packing was removed.  No significant purulence on packing.  Wound edges appears clean.  There is no surrounding erythema or induration.  Seton in place. Ext:  No LE edema Psych: A&Ox3  Skin: no rashes noted, warm and dry  Lab Results:  Recent Labs    08/08/19 0513 08/09/19 0313  WBC 11.5* 11.2*  HGB 12.2* 12.1*  HCT 37.5* 37.5*  PLT 251 276   BMET Recent Labs    08/08/19 0513 08/09/19 0313  NA 133* 139  K 3.1* 3.7  CL 96* 101  CO2 25 28  GLUCOSE 106* 128*  BUN 8 8  CREATININE 1.14 0.89  CALCIUM 9.1 9.3   PT/INR No results for input(s): LABPROT, INR in the last 72 hours. CMP     Component Value Date/Time   NA 139 08/09/2019 0313   NA 138 05/16/2018 0956   K 3.7 08/09/2019 0313   CL 101 08/09/2019 0313   CO2 28 08/09/2019 0313   GLUCOSE 128 (H) 08/09/2019 0313   BUN 8 08/09/2019 0313   BUN 16 05/16/2018 0956   CREATININE 0.89 08/09/2019 0313   CREATININE 1.01 04/03/2016 1549   CALCIUM 9.3 08/09/2019 0313   PROT 8.3 (H) 08/06/2019 1922   PROT 7.8 05/16/2018 0956   ALBUMIN 4.7 08/06/2019 1922   ALBUMIN 4.8 05/16/2018 0956   AST 22 08/06/2019 1922   ALT 19 08/06/2019 1922   ALKPHOS 66 08/06/2019 1922   BILITOT 0.9 08/06/2019 1922   BILITOT 0.4 05/16/2018 0956   GFRNONAA >60 08/09/2019 0313   GFRNONAA >89 04/03/2016 1549   GFRAA >60 08/09/2019 0313   GFRAA >89 04/03/2016 1549   Lipase     Component Value Date/Time   LIPASE 22 05/30/2016 1441       Studies/Results: Dg Abd 1 View  Result Date: 08/09/2019 CLINICAL DATA:  NG tube placement. EXAM: ABDOMEN - 1 VIEW COMPARISON:  One-view abdomen 08/09/2019 at 3:12 a.m. FINDINGS: NG tube has been placed. The side port is in the fundus of the stomach. The stomach is decompressed. Bowel gas pattern is unremarkable. Colon is within normal limits. IMPRESSION: 1. Decompression of stomach with NG tube in place. 2. Similar appearance of gas in the colon, within normal limits. Electronically Signed   By: Marin Robertshristopher  Mattern M.D.   On: 08/09/2019 05:44   Dg Abd 1 View  Result Date: 08/09/2019 CLINICAL DATA:  Sudden onset nausea and vomiting. Patient had  surgery yesterday for perirectal abscess. EXAM: ABDOMEN - 1 VIEW COMPARISON:  Pelvic CT 08/06/2019 FINDINGS: No evidence of free air on supine view. Mild gaseous colonic distention. No small bowel dilatation. Calcification in the left pelvis represents a phlebolith. Osseous structures are unremarkable. IMPRESSION: Mild gaseous colonic distention. No evidence of obstruction. Electronically Signed   By: Keith Rake M.D.   On: 08/09/2019 03:42    Anti-infectives: Anti-infectives (From admission, onward)   Start     Dose/Rate Route Frequency Ordered Stop   08/07/19 1600  piperacillin-tazobactam (ZOSYN) IVPB 3.375 g     3.375 g 12.5 mL/hr over 240 Minutes Intravenous Every 8 hours 08/07/19 1437          Assessment/Plan Substance abuse  Recurrent perirectal abscesses with anal fistula Status post I&D of perirectal abscess and placement of seton - Dr. Dema Severin - 9/2 - POD #1 - Sitz baths TID and after BM's - No indication for abx  FEN - CLD VTE - SCDs, Lovenox ID - Zosyn 9/1-9/3  Plan: Patient developed nausea and vomiting overnight.  He had a NG tube placed.  Plain films are without evidence of small bowel dilatation to suggest obstruction or ileus.  He is afebrile with stable vital signs.  White blood cell count stable/downtrending.  He has a benign abdominal exam.  Nausea and vomiting may be related to patient withdrawing. He is currently asymptomatic. Will clamp NG tube and give trial of clears.  If he tolerates this well pull NG tube and give full liquid diet.  If he tolerates this will discharge later this afternoon.  If he does not will have medicine consult.   LOS: 2 days    Jillyn Ledger , [facility] 08/09/2019, 8:32 AM Pager: [phone]

## 2019-08-13 LAB — AEROBIC/ANAEROBIC CULTURE W GRAM STAIN (SURGICAL/DEEP WOUND)

## 2019-08-14 NOTE — Discharge Summary (Signed)
Physician Discharge Summary  Patient ID: Victor Burns MRN: [id number] DOB/AGE: 1991/01/07 28 y.o.  Admit date: 08/07/2019 Discharge date: 08/09/2019  Admission Diagnoses:  Recurrent perirectal abscess Polysubstance abuse Dehydration CKD possibly secondary to dehydration   Discharge Diagnoses:  Recurrent perirectal abscess Anal fistula Polysubstance abuse Dehydration CKD -resolving  Active Problems:   Perirectal abscess s/p I&D 06/16/2018   PROCEDURES: Incision and drainage of perirectal abscess, placement of seton, 08/08/2019, Dr. Tana Conch Course: Patient is a 28 year old male who presented to the ED with a recurrent perirectal abscess.  He was seen on 02/01/2018 and again on 06/16/2018 with perirectal abscesses.  He presented to the ED yesterday with a complaint of a perirectal abscess was ongoing for about 5 days.  Pain was severe and they were unable to examine him.  He was discharged home with some pain medications and local wound care and instructed to come back to the ED today.  Patient is afebrile.  Labs obtained yesterday showed a potassium of 3.1, glucose of 114, creatinine of 1.25.  WBC 10.2, hemoglobin 13.6, hematocrit 42.2.  A CT of the pelvis shows a 3.9 x 2 x 3.9 cm left gluteal abscess with stranding  extending to the low rectum and intersphincteric inflammatory features suggesting transsphincteric extension.  We are asked to see. He also reports he has been HIV negative in the past.  He was seen in the emergency department by Dr. Nadeen Landau admitted.  He was placed on IV antibiotics and subsequently taken the operating room the following a.m.  He underwent repeat I&D of his perirectal fistula.  He was also found to have an anal fistula and a seton was placed by Dr. Dema Severin.  Patient tolerated the procedure well and returned to the floor in satisfactory condition.  He was having a fair amount of pain.  He was not able to go home that evening.  He later  developed nausea vomiting and NG was placed.  NG was removed the first postoperative day.  He was able to take liquids.  His diet was advanced and he was ready for discharge in the p.m.  He has been through this before.  Setons in place and he knows not to remove it.  We reviewed care instructions for this.  He is also to take sitz bath's couple times a day.  Keep the area as clean as possible.  He will follow-up in the office with Dr. Dema Severin as described below.  EtOH: Heavy -case of beer per day.  Up to 2 gallons of rum on the weekends. Drugs: Marijuana, ecstasy, and cocaine on the weekends.  Last use 08/05/2019 Tobacco: Less than 1 pack/day.  Condition on discharge: Improving. CBC Latest Ref Rng & Units 08/09/2019 08/08/2019 08/07/2019  WBC 4.0 - 10.5 K/uL 11.2(H) 11.5(H) 10.5  Hemoglobin 13.0 - 17.0 g/dL 12.1(L) 12.2(L) 13.6  Hematocrit 39.0 - 52.0 % 37.5(L) 37.5(L) 41.3  Platelets 150 - 400 K/uL 276 251 304   CMP Latest Ref Rng & Units 08/09/2019 08/08/2019 08/07/2019  Glucose 70 - 99 mg/dL 128(H) 106(H) 117(H)  BUN 6 - 20 mg/dL 8 8 8   Creatinine 0.61 - 1.24 mg/dL 0.89 1.14 1.18  Sodium 135 - 145 mmol/L 139 133(L) 136  Potassium 3.5 - 5.1 mmol/L 3.7 3.1(L) 3.4(L)  Chloride 98 - 111 mmol/L 101 96(L) 99  CO2 22 - 32 mmol/L 28 25 23   Calcium 8.9 - 10.3 mg/dL 9.3 9.1 9.6  Total Protein 6.5 - 8.1 g/dL - - -  Total Bilirubin 0.3 - 1.2 mg/dL - - -  Alkaline Phos 38 - 126 U/L - - -  AST 15 - 41 U/L - - -  ALT 0 - 44 U/L - - -    Condition on discharge: Improving         Disposition:    Allergies as of 08/09/2019      Reactions   Bee Venom Anaphylaxis   Shellfish Allergy Anaphylaxis   Other Itching   CHG wipes      Medication List    STOP taking these medications   HYDROcodone-acetaminophen 5-325 MG tablet Commonly known as: NORCO/VICODIN   hydrocortisone cream 1 %     TAKE these medications   acetaminophen 500 MG tablet Commonly known as: TYLENOL Take 2 tablets every 8 hours  for pain as needed.  You can alternate this with Ibuprofen so you can take something 6 times a day for pain that is non narcotic.  You can start Tylenol(acetaminophen) first. Then start the ibuprofen 2 hours later. You can buy both the Tylenol and ibuprofen over the counter.  Do not take more than 3000 mg of Tylenol a day for pain.  More can harm your liver.   albuterol 108 (90 Base) MCG/ACT inhaler Commonly known as: VENTOLIN HFA Inhale 2 puffs into the lungs every 4 (four) hours as needed for wheezing or shortness of breath.   ibuprofen 200 MG tablet Commonly known as: ADVIL You can take 2 tablets every 6 hours as needed for pain not relieved by plain Tylenol.  You can start the Ibuprofen 2 hours after you start the Tylenol. Alternate Tylenol/ibuprofen as so you can have something non narcotic every 4 hours as needed.  Use the oxycodone as a last resort.  We will not refill the oxycodone.  Tylenol and ibuprofen is over the counter. What changed:   medication strength  how much to take  how to take this  when to take this  reasons to take this  additional instructions  Another medication with the same name was removed. Continue taking this medication, and follow the directions you see here.   ondansetron 4 MG tablet Commonly known as: ZOFRAN Take 1 tablet (4 mg total) by mouth every 6 (six) hours.   oxyCODONE 5 MG immediate release tablet Commonly known as: Oxy IR/ROXICODONE Take 1 tablet (5 mg total) by mouth every 6 (six) hours as needed for severe pain (pain not relieved by Tylenol & ibuprofen).   therapeutic multivitamin-minerals tablet Take 1 tablet by mouth daily.   vitamin C 500 MG tablet Commonly known as: ASCORBIC ACID Take 1,000 mg by mouth daily.     ASK your doctor about these medications   clindamycin 300 MG capsule Commonly known as: Cleocin Take 1 capsule (300 mg total) by mouth 3 (three) times daily for 5 days. Ask about: Should I take this medication?       Follow-up Information    Andria MeuseWhite, Christopher M, MD Follow up on 08/28/2019.   Specialty: General Surgery Why: Your appointment is at 11:15 AM.  Be at the office 30 minutes early for check in.  Bring photo ID and insurance information.   Contact information: [address] [place] [address] [phone]           Signed: Sherrie GeorgeJENNINGS,Asuzena Weis 08/14/2019, 3:43 PM

## 2019-11-20 ENCOUNTER — Other Ambulatory Visit: Payer: Self-pay

## 2019-11-20 ENCOUNTER — Encounter (HOSPITAL_COMMUNITY): Payer: Self-pay

## 2019-11-20 ENCOUNTER — Emergency Department (HOSPITAL_COMMUNITY): Payer: BC Managed Care – PPO

## 2019-11-20 ENCOUNTER — Emergency Department (HOSPITAL_COMMUNITY)
Admission: EM | Admit: 2019-11-20 | Discharge: 2019-11-20 | Disposition: A | Discharge status: Home / Self Care | Payer: BC Managed Care – PPO | Attending: Emergency Medicine | Admitting: Emergency Medicine

## 2019-11-20 DIAGNOSIS — K6289 Other specified diseases of anus and rectum: Secondary | ICD-10-CM | POA: Insufficient documentation

## 2019-11-20 DIAGNOSIS — F1210 Cannabis abuse, uncomplicated: Secondary | ICD-10-CM | POA: Insufficient documentation

## 2019-11-20 DIAGNOSIS — Z9889 Other specified postprocedural states: Secondary | ICD-10-CM | POA: Insufficient documentation

## 2019-11-20 DIAGNOSIS — F17210 Nicotine dependence, cigarettes, uncomplicated: Secondary | ICD-10-CM | POA: Diagnosis not present

## 2019-11-20 DIAGNOSIS — Z5189 Encounter for other specified aftercare: Secondary | ICD-10-CM | POA: Diagnosis not present

## 2019-11-20 DIAGNOSIS — F1410 Cocaine abuse, uncomplicated: Secondary | ICD-10-CM | POA: Diagnosis not present

## 2019-11-20 DIAGNOSIS — Z79899 Other long term (current) drug therapy: Secondary | ICD-10-CM | POA: Diagnosis not present

## 2019-11-20 DIAGNOSIS — J45909 Unspecified asthma, uncomplicated: Secondary | ICD-10-CM | POA: Diagnosis not present

## 2019-11-20 LAB — I-STAT CHEM 8, ED
BUN: 14 mg/dL (ref 6–20)
Calcium, Ion: 1.21 mmol/L (ref 1.15–1.40)
Chloride: 103 mmol/L (ref 98–111)
Creatinine, Ser: 1.2 mg/dL (ref 0.61–1.24)
Glucose, Bld: 101 mg/dL — ABNORMAL HIGH (ref 70–99)
HCT: 39 % (ref 39.0–52.0)
Hemoglobin: 13.3 g/dL (ref 13.0–17.0)
Potassium: 3.9 mmol/L (ref 3.5–5.1)
Sodium: 139 mmol/L (ref 135–145)
TCO2: 28 mmol/L (ref 22–32)

## 2019-11-20 LAB — CBC WITH DIFFERENTIAL/PLATELET
Abs Immature Granulocytes: 0 10*3/uL (ref 0.00–0.07)
Basophils Absolute: 0 10*3/uL (ref 0.0–0.1)
Basophils Relative: 1 %
Eosinophils Absolute: 0.1 10*3/uL (ref 0.0–0.5)
Eosinophils Relative: 1 %
HCT: 39.4 % (ref 39.0–52.0)
Hemoglobin: 12.5 g/dL — ABNORMAL LOW (ref 13.0–17.0)
Immature Granulocytes: 0 %
Lymphocytes Relative: 35 %
Lymphs Abs: 1.5 10*3/uL (ref 0.7–4.0)
MCH: 29.4 pg (ref 26.0–34.0)
MCHC: 31.7 g/dL (ref 30.0–36.0)
MCV: 92.7 fL (ref 80.0–100.0)
Monocytes Absolute: 0.5 10*3/uL (ref 0.1–1.0)
Monocytes Relative: 11 %
Neutro Abs: 2.3 10*3/uL (ref 1.7–7.7)
Neutrophils Relative %: 52 %
Platelets: 308 10*3/uL (ref 150–400)
RBC: 4.25 MIL/uL (ref 4.22–5.81)
RDW: 13.6 % (ref 11.5–15.5)
WBC: 4.4 10*3/uL (ref 4.0–10.5)
nRBC: 0 % (ref 0.0–0.2)

## 2019-11-20 LAB — BASIC METABOLIC PANEL
Anion gap: 9 (ref 5–15)
BUN: 17 mg/dL (ref 6–20)
CO2: 26 mmol/L (ref 22–32)
Calcium: 9.1 mg/dL (ref 8.9–10.3)
Chloride: 105 mmol/L (ref 98–111)
Creatinine, Ser: 1.13 mg/dL (ref 0.61–1.24)
GFR calc Af Amer: >60 mL/min (ref >60)
GFR calc non Af Amer: >60 mL/min (ref >60)
Glucose, Bld: 108 mg/dL — ABNORMAL HIGH (ref 70–99)
Potassium: 4.1 mmol/L (ref 3.5–5.1)
Sodium: 140 mmol/L (ref 135–145)

## 2019-11-20 MED ORDER — MORPHINE SULFATE (PF) 4 MG/ML IV SOLN
4.0000 mg | Freq: Once | INTRAVENOUS | Status: AC
  Administered 2019-11-20: 4 mg via INTRAVENOUS
  Filled 2019-11-20: qty 1

## 2019-11-20 MED ORDER — SODIUM CHLORIDE (PF) 0.9 % IJ SOLN
INTRAMUSCULAR | Status: AC
  Filled 2019-11-20: qty 50

## 2019-11-20 MED ORDER — HYDROCODONE-ACETAMINOPHEN 5-325 MG PO TABS: 1.0000 | ORAL_TABLET | Freq: Four times a day (QID) | ORAL | 0 refills | Status: DC | PRN

## 2019-11-20 MED ORDER — ONDANSETRON HCL 4 MG/2ML IJ SOLN
4.0000 mg | Freq: Once | INTRAMUSCULAR | Status: AC
  Administered 2019-11-20: 16:00:00 4 mg via INTRAVENOUS
  Filled 2019-11-20: qty 2

## 2019-11-20 MED ORDER — IOHEXOL 300 MG/ML  SOLN
100.0000 mL | Freq: Once | INTRAMUSCULAR | Status: AC | PRN
  Administered 2019-11-20: 17:00:00 100 mL via INTRAVENOUS

## 2019-11-20 NOTE — ED Provider Notes (Signed)
[facility]-EMERGENCY DEPT Provider Note   CSN: [id number] Arrival date & time: 11/20/19  1238     History Chief Complaint  Patient presents with  . Wound Check    Victor Burns is a 28 y.o. male with PMH/o perirectal abscess, arthritis, alcohol abuse who presents for evaluation of rectal pain that has been on going for the last few days. He has a history of perirectal abscess that has required surgical drainage in September 2020. He has been following with outpatient surgery and last saw them in early November 2020. During his admission he had a seton placed and was told in early November that it needed to stay in another month. Due to some financial issues, he has been unable to follow up with them and still has the seton in place. He states that for the last few days he has had some sharp pain to the area that has become more constant and more severe. He has he has noticed some light drainage from the area. He has still been able to have good bowel movements and has no difficulty urinating. He denies any fevers, dysuria and hematuria.   The history is provided by the patient.       Past Medical History:  Diagnosis Date  . Abscess   . Arthritis   . Asthma     Patient Active Problem List   Diagnosis Date Noted  . Perirectal abscess s/p I&D 06/16/2018 06/16/2018  . Alcohol abuse 06/16/2018  . Mild persistent asthma 10/07/2017    Past Surgical History:  Procedure Laterality Date  . EVALUATION UNDER ANESTHESIA WITH HEMORRHOIDECTOMY N/A 08/08/2019   Procedure: EXAM UNDER ANESTHESIA WITH IRRIGATION AND DRAINAGE OF PERIRECTAL ABSCESS;  Surgeon: Andria MeuseWhite, Christopher M, MD;  Location: [facility];  Service: General;  Laterality: N/A;  . INCISION AND DRAINAGE PERIRECTAL ABSCESS N/A 06/16/2018   Procedure: Francia GreavesEXAM UNDER ANESTHESIA IRRIGATION AND DEBRIDEMENT PERIRECTAL ABSCESS;  Surgeon: Emelia LoronWakefield, Matthew, MD;  Location: [facility];  Service: General;  Laterality: N/A;  . KNEE  ARTHROSCOPY    . PLACEMENT OF SETON  08/08/2019   Procedure: PLACEMENT OF DRAINING SETON;  Surgeon: Andria MeuseWhite, Christopher M, MD;  Location: [facility];  Service: General;;       History reviewed. No pertinent family history.  Social History   Tobacco Use  . Smoking status: Current Every Day Smoker    Packs/day: 0.30    Types: Cigarettes  . Smokeless tobacco: Never Used  Substance Use Topics  . Alcohol use: Not Currently    Comment: at least a beer a day; last drink 08/05/2019  . Drug use: Yes    Types: Marijuana, Cocaine, MDMA (Ecstacy)    Comment: last use 08/05/2019    Home Medications Prior to Admission medications   Medication Sig Start Date End Date Taking? Authorizing Provider  albuterol (PROVENTIL HFA;VENTOLIN HFA) 108 (90 Base) MCG/ACT inhaler Inhale 2 puffs into the lungs every 4 (four) hours as needed for wheezing or shortness of breath. 05/16/18  Yes McVey, Madelaine BhatElizabeth Whitney, PA-C  vitamin C (ASCORBIC ACID) 500 MG tablet Take 1,000 mg by mouth daily.   Yes [provider]  acetaminophen (TYLENOL) 500 MG tablet Take 2 tablets every 8 hours for pain as needed.  You can alternate this with Ibuprofen so you can take something 6 times a day for pain that is non narcotic.  You can start Tylenol(acetaminophen) first. Then start the ibuprofen 2 hours later. You can buy both the Tylenol and ibuprofen over  the counter.  Do not take more than 3000 mg of Tylenol a day for pain.  More can harm your liver. Patient not taking: Reported on 11/20/2019 08/08/19   Earnstine Regal, PA-C  HYDROcodone-acetaminophen (NORCO/VICODIN) 5-325 MG tablet Take 1-2 tablets by mouth every 6 (six) hours as needed. 11/20/19   Volanda Napoleon, PA-C  ibuprofen (ADVIL) 200 MG tablet You can take 2 tablets every 6 hours as needed for pain not relieved by plain Tylenol.  You can start the Ibuprofen 2 hours after you start the Tylenol. Alternate Tylenol/ibuprofen as so you can have something non narcotic every 4  hours as needed.  Use the oxycodone as a last resort.  We will not refill the oxycodone.  Tylenol and ibuprofen is over the counter. Patient not taking: Reported on 11/20/2019 08/08/19   Earnstine Regal, PA-C  ondansetron (ZOFRAN) 4 MG tablet Take 1 tablet (4 mg total) by mouth every 6 (six) hours. Patient not taking: Reported on 11/20/2019 08/09/19   Maczis, Barth Kirks, PA-C  oxyCODONE (OXY IR/ROXICODONE) 5 MG immediate release tablet Take 1 tablet (5 mg total) by mouth every 6 (six) hours as needed for severe pain (pain not relieved by Tylenol & ibuprofen). Patient not taking: Reported on 11/20/2019 08/08/19   Earnstine Regal, PA-C  therapeutic multivitamin-minerals [facility]) tablet Take 1 tablet by mouth daily. Patient not taking: Reported on 11/20/2019 08/08/19 08/07/20  Earnstine Regal, PA-C    Allergies    Bee venom, Shellfish allergy, and Other  Review of Systems   Review of Systems  Constitutional: Negative for fever.  Cardiovascular: Negative for chest pain.  Gastrointestinal: Negative for abdominal pain, nausea and vomiting.  Genitourinary: Negative for dysuria and hematuria.  Skin: Positive for wound.  Neurological: Negative for headaches.  All other systems reviewed and are negative.   Physical Exam Updated Vital Signs BP (!) 125/58 (BP Location: Right Arm)   Pulse 66   Temp 98.2 F (36.8 C) (Oral)   Resp 18   SpO2 98%   Physical Exam Vitals and nursing note reviewed. Exam conducted with a chaperone present.  Constitutional:      Appearance: Normal appearance. He is well-developed.  HENT:     Head: Normocephalic and atraumatic.  Eyes:     General: Lids are normal.     Conjunctiva/sclera: Conjunctivae normal.     Pupils: Pupils are equal, round, and reactive to light.  Cardiovascular:     Rate and Rhythm: Normal rate and regular rhythm.     Pulses: Normal pulses.     Heart sounds: Normal heart sounds. No murmur. No friction rub. No gallop.   Pulmonary:      Effort: Pulmonary effort is normal.     Breath sounds: Normal breath sounds.  Abdominal:     Palpations: Abdomen is soft. Abdomen is not rigid.     Tenderness: There is no abdominal tenderness. There is no guarding.  Genitourinary:    Testes:        Right: Mass or tenderness not present.        Left: Mass or tenderness not present.       Comments: The exam was performed with a chaperone present. Tenderness noted to the left gluteal. Seton in place with some minimal drainage.  No overlying warmth, erythema.  Unable to complete internal digital rectal exam secondary to pain.  No tenderness palpation noted bilateral testicles.  No overlying warmth, erythema, edema.  No crepitus noted. Musculoskeletal:  General: Normal range of motion.     Cervical back: Full passive range of motion without pain.  Skin:    General: Skin is warm and dry.     Capillary Refill: Capillary refill takes less than 2 seconds.  Neurological:     Mental Status: He is alert and oriented to person, place, and time.  Psychiatric:        Speech: Speech normal.     ED Results / Procedures / Treatments   Labs (all labs ordered are listed, but only abnormal results are displayed) Labs Reviewed  BASIC METABOLIC PANEL - Abnormal; Notable for the following components:      Result Value   Glucose, Bld 108 (*)    All other components within normal limits  CBC WITH DIFFERENTIAL/PLATELET - Abnormal; Notable for the following components:   Hemoglobin 12.5 (*)    All other components within normal limits  I-STAT CHEM 8, ED - Abnormal; Notable for the following components:   Glucose, Bld 101 (*)    All other components within normal limits    EKG None  Radiology CT ABDOMEN PELVIS W CONTRAST  Result Date: 11/20/2019 CLINICAL DATA:  Rectal pain.  Follow-up perirectal abscess. EXAM: CT ABDOMEN AND PELVIS WITH CONTRAST TECHNIQUE: Multidetector CT imaging of the abdomen and pelvis was performed using the standard  protocol following bolus administration of intravenous contrast. CONTRAST:  OMNIPAQUE IOHEXOL 300 MG/ML  SOLN COMPARISON:  05/21/2019 FINDINGS: Lower Chest: No acute findings. Hepatobiliary: No hepatic masses identified. Gallbladder is unremarkable. No evidence of biliary ductal dilatation. Pancreas:  No mass or inflammatory changes. Spleen: Within normal limits in size and appearance. Adrenals/Urinary Tract: No masses identified. A few tiny less than 5 mm right renal calculi are identified. No evidence of ureteral calculi or hydronephrosis. Unremarkable unopacified urinary bladder. Stomach/Bowel: No evidence of obstruction, inflammatory process or abnormal fluid collections. Normal appendix visualized. A new seton is seen in the left perianal soft tissues, and previously seen left perianal fluid collection is no longer visualized. Vascular/Lymphatic: No pathologically enlarged lymph nodes. No abdominal aortic aneurysm. Reproductive:  No mass or other significant abnormality. Other:  None. Musculoskeletal:  No suspicious bone lesions identified. IMPRESSION: Resolution of previously seen left perianal abscess following seton placement. No acute findings. Right nephrolithiasis. No evidence of ureteral calculi or hydronephrosis. Electronically Signed   By: Danae Orleans M.D.   On: 11/20/2019 17:17    Procedures Procedures (including critical care time)  Medications Ordered in ED Medications  sodium chloride (PF) 0.9 % injection (has no administration in time range)  morphine 4 MG/ML injection 4 mg (4 mg Intravenous Given 11/20/19 1603)  ondansetron (ZOFRAN) injection 4 mg (4 mg Intravenous Given 11/20/19 1602)  iohexol (OMNIPAQUE) 300 MG/ML solution 100 mL (100 mLs Intravenous Contrast Given 11/20/19 1646)    ED Course  I have reviewed the triage vital signs and the nursing notes.  Pertinent labs & imaging results that were available during my care of the patient were reviewed by me and considered  in my medical decision making (see chart for details).    MDM Rules/Calculators/A&P                      28 y.o. M who presents for evaluation of rectal pain that has been ongoing for the last week or so. Worsening, sharp pain over the last few days. No fevers. Patient is afebrile, non-toxic appearing, sitting comfortably on examination table. Vital signs reviewed and stable.  Benign abdominal exam. On rectal exam, he has a seton in place that has some mild drainage. He has tenderness to palpation noted to the left gluteal. Unable to complete digital rectal exam secondary to pain.  The surrounding skin does not appear erythematous, warmth.  There is no induration.  Bilateral testicles are without tenderness, crepitus.  Exam not concerning for Fournier's..  Plan for labs and imaging.   Discussed patient with Debbe Bales (Gen Surg PA) who reviewed patient's records from [facility].  He had been seeing Dr. Cliffton Asters to had requested that the seton drain be kept in place.  If work-up is unremarkable, can follow-up with Dr. Cliffton Asters in the office.  BMP is unremarkable.  CBC shows no leukocytosis.  CT scan shows resolution of previously seen left perianal abscess following stent placement.  No acute findings.  I discussed results with patient.  Patient is very adamant that the drain be replaced.  I discussed with patient that surgery recommendation is to keep it in but he can follow-up with outpatient surgeon for evaluation of the drain.  This time, I do not feel that emergent removal of the drain, particularly given the surgeons request is indicated.  Will give patient a short course of pain medication until he is able to see outpatient surgery for follow-up. At this time, patient exhibits no emergent life-threatening condition that require further evaluation in ED or admission. Patient had ample opportunity for questions and discussion. All patient's questions were answered with full understanding. Strict return  precautions discussed. Patient expresses understanding and agreement to plan.   Portions of this note were generated with Scientist, clinical (histocompatibility and immunogenetics). Dictation errors may occur despite best attempts at proofreading.   Final Clinical Impression(s) / ED Diagnoses Final diagnoses:  Visit for wound check    Rx / DC Orders ED Discharge Orders         Ordered    HYDROcodone-acetaminophen (NORCO/VICODIN) 5-325 MG tablet  Every 6 hours PRN     11/20/19 1756           Maxwell Caul, PA-C 11/20/19 1823    Milagros Loll, MD 11/21/19 [phone]

## 2019-11-20 NOTE — ED Notes (Signed)
Pt requesting meds changed to different pharmacy and sitz bath bath.

## 2019-11-20 NOTE — ED Triage Notes (Signed)
Pt presents with c/o wound check. Pt reports an open wound in his rectum that he has been treated for. Pt reports that the pain around the wound is now constant and he is worried that it is getting worse.

## 2019-11-20 NOTE — Discharge Instructions (Signed)
As we discussed, today your work-up was reassuring.  You need to follow-up with Dr. Orest Dikes office.  Please call the surgery office arrange for an appointment.  You can take Tylenol or Ibuprofen as directed for pain. You can alternate Tylenol and Ibuprofen every 4 hours. If you take Tylenol at 1pm, then you can take Ibuprofen at 5pm. Then you can take Tylenol again at 9pm.   Take pain medications as directed for break through pain. Do not drive or operate machinery while taking this medication.   Return emergency department any fever, worsening pain, drainage or any other worsening concerning symptoms.

## 2019-12-12 ENCOUNTER — Other Ambulatory Visit: Payer: Self-pay

## 2019-12-12 DIAGNOSIS — Z20822 Contact with and (suspected) exposure to covid-19: Secondary | ICD-10-CM

## 2019-12-13 LAB — NOVEL CORONAVIRUS, NAA: SARS-CoV-2, NAA: DETECTED — AB

## 2019-12-14 ENCOUNTER — Other Ambulatory Visit: Payer: Self-pay

## 2019-12-14 ENCOUNTER — Encounter (HOSPITAL_COMMUNITY): Payer: Self-pay | Admitting: Emergency Medicine

## 2019-12-14 ENCOUNTER — Emergency Department (HOSPITAL_COMMUNITY)
Admission: EM | Admit: 2019-12-14 | Discharge: 2019-12-14 | Disposition: A | Discharge status: Home / Self Care | Payer: 59 | Attending: Emergency Medicine | Admitting: Emergency Medicine

## 2019-12-14 ENCOUNTER — Emergency Department (HOSPITAL_COMMUNITY): Payer: 59

## 2019-12-14 ENCOUNTER — Telehealth: Payer: Self-pay | Admitting: Critical Care Medicine

## 2019-12-14 DIAGNOSIS — J45909 Unspecified asthma, uncomplicated: Secondary | ICD-10-CM | POA: Insufficient documentation

## 2019-12-14 DIAGNOSIS — F17210 Nicotine dependence, cigarettes, uncomplicated: Secondary | ICD-10-CM | POA: Insufficient documentation

## 2019-12-14 DIAGNOSIS — K610 Anal abscess: Secondary | ICD-10-CM | POA: Diagnosis not present

## 2019-12-14 DIAGNOSIS — K6289 Other specified diseases of anus and rectum: Secondary | ICD-10-CM | POA: Diagnosis present

## 2019-12-14 LAB — COMPREHENSIVE METABOLIC PANEL
ALT: 24 U/L (ref 0–44)
AST: 24 U/L (ref 15–41)
Albumin: 4.5 g/dL (ref 3.5–5.0)
Alkaline Phosphatase: 61 U/L (ref 38–126)
Anion gap: 10 (ref 5–15)
BUN: 11 mg/dL (ref 6–20)
CO2: 24 mmol/L (ref 22–32)
Calcium: 9.1 mg/dL (ref 8.9–10.3)
Chloride: 105 mmol/L (ref 98–111)
Creatinine, Ser: 1.04 mg/dL (ref 0.61–1.24)
GFR calc Af Amer: >60 mL/min (ref >60)
GFR calc non Af Amer: >60 mL/min (ref >60)
Glucose, Bld: 95 mg/dL (ref 70–99)
Potassium: 4.1 mmol/L (ref 3.5–5.1)
Sodium: 139 mmol/L (ref 135–145)
Total Bilirubin: 0.7 mg/dL (ref 0.3–1.2)
Total Protein: 8 g/dL (ref 6.5–8.1)

## 2019-12-14 LAB — CBC WITH DIFFERENTIAL/PLATELET
Abs Immature Granulocytes: 0 10*3/uL (ref 0.00–0.07)
Basophils Absolute: 0 10*3/uL (ref 0.0–0.1)
Basophils Relative: 0 %
Eosinophils Absolute: 0 10*3/uL (ref 0.0–0.5)
Eosinophils Relative: 0 %
HCT: 39.4 % (ref 39.0–52.0)
Hemoglobin: 12.7 g/dL — ABNORMAL LOW (ref 13.0–17.0)
Immature Granulocytes: 0 %
Lymphocytes Relative: 34 %
Lymphs Abs: 1.2 10*3/uL (ref 0.7–4.0)
MCH: 30 pg (ref 26.0–34.0)
MCHC: 32.2 g/dL (ref 30.0–36.0)
MCV: 92.9 fL (ref 80.0–100.0)
Monocytes Absolute: 0.2 10*3/uL (ref 0.1–1.0)
Monocytes Relative: 7 %
Neutro Abs: 2 10*3/uL (ref 1.7–7.7)
Neutrophils Relative %: 59 %
Platelets: 253 10*3/uL (ref 150–400)
RBC: 4.24 MIL/uL (ref 4.22–5.81)
RDW: 13 % (ref 11.5–15.5)
WBC: 3.5 10*3/uL — ABNORMAL LOW (ref 4.0–10.5)
nRBC: 0 % (ref 0.0–0.2)

## 2019-12-14 MED ORDER — IOHEXOL 300 MG/ML  SOLN
100.0000 mL | Freq: Once | INTRAMUSCULAR | Status: AC | PRN
  Administered 2019-12-14: 100 mL via INTRAVENOUS

## 2019-12-14 MED ORDER — METRONIDAZOLE 500 MG PO TABS: 500.0000 mg | ORAL_TABLET | Freq: Two times a day (BID) | ORAL | 0 refills | Status: DC

## 2019-12-14 MED ORDER — HYDROCODONE-ACETAMINOPHEN 5-325 MG PO TABS: 1.0000 | ORAL_TABLET | Freq: Four times a day (QID) | ORAL | 0 refills | Status: DC | PRN

## 2019-12-14 MED ORDER — MORPHINE SULFATE (PF) 4 MG/ML IV SOLN
4.0000 mg | Freq: Once | INTRAVENOUS | Status: AC
  Administered 2019-12-14: 4 mg via INTRAVENOUS
  Filled 2019-12-14: qty 1

## 2019-12-14 MED ORDER — SODIUM CHLORIDE (PF) 0.9 % IJ SOLN
INTRAMUSCULAR | Status: AC
  Filled 2019-12-14: qty 50

## 2019-12-14 MED ORDER — ONDANSETRON HCL 4 MG/2ML IJ SOLN
4.0000 mg | Freq: Once | INTRAMUSCULAR | Status: AC
  Administered 2019-12-14: 4 mg via INTRAVENOUS
  Filled 2019-12-14: qty 2

## 2019-12-14 MED ORDER — CIPROFLOXACIN HCL 500 MG PO TABS: 500.0000 mg | ORAL_TABLET | Freq: Two times a day (BID) | ORAL | 0 refills | Status: DC

## 2019-12-14 NOTE — Discharge Instructions (Addendum)
You were seen in the emergency department today for increased rectal pain.  Your CT scan showed findings of a perianal abscess that was draining.  We are starting you on ciprofloxacin and Flagyl, these are both antibiotics, to treat the infection.  Please take these as prescribed.  Do not drink alcohol with Flagyl as it can have severe side effects, do not participate in sports activities or make quick movements while taking ciprofloxacin as it makes you susceptible to tendon injury such as an Achilles tendon tear.  We are sending home with Norco to help with pain. -Norco-this is a narcotic/controlled substance medication that has potential addicting qualities.  We recommend that you take 1-2 tablets every 6 hours as needed for severe pain.  Do not drive or operate heavy machinery when taking this medicine as it can be sedating. Do not drink alcohol or take other sedating medications when taking this medicine for safety reasons.  Keep this out of reach of small children.  Please be aware this medicine has Tylenol in it (325 mg/tab) do not exceed the maximum dose of Tylenol in a day per over the counter recommendations should you decide to supplement with Tylenol over the counter.   We have prescribed you new medication(s) today. Discuss the medications prescribed today with your pharmacist as they can have adverse effects and interactions with your other medicines including over the counter and prescribed medications. Seek medical evaluation if you start to experience new or abnormal symptoms after taking one of these medicines, seek care immediately if you start to experience difficulty breathing, feeling of your throat closing, facial swelling, or rash as these could be indications of a more serious allergic reaction  We would like you to follow-up closely with general surgery as detailed in your discharge instructions.  Please follow-up sooner by calling as needed.  Return to the ER for new or worsening  symptoms including but not limited to increased pain, fever, inability to have a bowel movement, abdominal pain, vomiting, or any other concerns.

## 2019-12-14 NOTE — Telephone Encounter (Signed)
I connected with the patient who is covid +  From 1/6 testing.  He has no symptoms and knows to stay in isolation until 1/17  He is not a mAB candidate

## 2019-12-14 NOTE — ED Triage Notes (Signed)
Pt reports has wound in rectum and still having pains and has drain in there for 6 months which was supposed to be a month. can't be seen at the doctor's office to get drain out until the bill is paid.  Reports tested Covid+ this morning.

## 2019-12-14 NOTE — ED Provider Notes (Signed)
[facility]-EMERGENCY DEPT Provider Note   CSN: [id number] Arrival date & time: 12/14/19  1152     History Chief Complaint  Patient presents with  . Wound Check    Victor Burns is a 29 y.o. male with a hx of tobacco abuse, asthma, & perirectal abscess s/p surgical I&D 08/08/19 with seton drain in place who presents to the ED with complaints of rectal pain x 6 days. Per chart review patient was admitted 08/07/19-08/09/19 & underwent I&D of perirectal abscess with placement of seton by Dr. Cliffton Asters. Followed as outpatient, last seen in November 2020. Seen by ED 11/20/19- CT A/P @ that time revealed resolution of previously seen L perianal abscess following seton placement, general surgery consult @ that time recommended keeping drain in place- clinic follow up.  Unable to follow up due to financial issues. States things got a bit better until 6 days ago he started to have worsened intermittent rectal pain. States this is throbbing in nature, worse with certain positions, and significant aggravated by BMs. Stool has been somewhat loose since onset of pain. Last BM was @ 0400 this AM, he states he tried to have a BM just PTA but could not secondary to pain, feels something is stuck. Has had some white/yellow drainage to site, no blood. Pain is worse than last visit. He has had some chills. Reports that earlier this week (Monday/tuesday) he had dyspnea & sweats- tested positive for COVID 19- sxs now resolved. Denies fever, nausea, vomiting, or abdominal pain.   HPI     Past Medical History:  Diagnosis Date  . Abscess   . Arthritis   . Asthma     Patient Active Problem List   Diagnosis Date Noted  . Perirectal abscess s/p I&D 06/16/2018 06/16/2018  . Alcohol abuse 06/16/2018  . Mild persistent asthma 10/07/2017    Past Surgical History:  Procedure Laterality Date  . EVALUATION UNDER ANESTHESIA WITH HEMORRHOIDECTOMY N/A 08/08/2019   Procedure: EXAM UNDER ANESTHESIA WITH  IRRIGATION AND DRAINAGE OF PERIRECTAL ABSCESS;  Surgeon: Andria Meuse, MD;  Location: [facility];  Service: General;  Laterality: N/A;  . INCISION AND DRAINAGE PERIRECTAL ABSCESS N/A 06/16/2018   Procedure: Francia Greaves UNDER ANESTHESIA IRRIGATION AND DEBRIDEMENT PERIRECTAL ABSCESS;  Surgeon: Emelia Loron, MD;  Location: [facility];  Service: General;  Laterality: N/A;  . KNEE ARTHROSCOPY    . PLACEMENT OF SETON  08/08/2019   Procedure: PLACEMENT OF DRAINING SETON;  Surgeon: Andria Meuse, MD;  Location: [facility];  Service: General;;       No family history on file.  Social History   Tobacco Use  . Smoking status: Current Every Day Smoker    Packs/day: 0.30    Types: Cigarettes  . Smokeless tobacco: Never Used  Substance Use Topics  . Alcohol use: Not Currently    Comment: at least a beer a day; last drink 08/05/2019  . Drug use: Yes    Types: Marijuana, Cocaine, MDMA (Ecstacy)    Comment: last use 08/05/2019    Home Medications Prior to Admission medications   Medication Sig Start Date End Date Taking? Authorizing Provider  acetaminophen (TYLENOL) 500 MG tablet Take 2 tablets every 8 hours for pain as needed.  You can alternate this with Ibuprofen so you can take something 6 times a day for pain that is non narcotic.  You can start Tylenol(acetaminophen) first. Then start the ibuprofen 2 hours later. You can buy both the Tylenol and ibuprofen over the  counter.  Do not take more than 3000 mg of Tylenol a day for pain.  More can harm your liver. Patient not taking: Reported on 11/20/2019 08/08/19   Sherrie George, PA-C  albuterol (PROVENTIL HFA;VENTOLIN HFA) [phone] Base) MCG/ACT inhaler Inhale 2 puffs into the lungs every 4 (four) hours as needed for wheezing or shortness of breath. 05/16/18   McVey, Madelaine Bhat, PA-C  HYDROcodone-acetaminophen (NORCO/VICODIN) 5-325 MG tablet Take 1-2 tablets by mouth every 6 (six) hours as needed. 11/20/19   Maxwell Caul, PA-C  ibuprofen  (ADVIL) 200 MG tablet You can take 2 tablets every 6 hours as needed for pain not relieved by plain Tylenol.  You can start the Ibuprofen 2 hours after you start the Tylenol. Alternate Tylenol/ibuprofen as so you can have something non narcotic every 4 hours as needed.  Use the oxycodone as a last resort.  We will not refill the oxycodone.  Tylenol and ibuprofen is over the counter. Patient not taking: Reported on 11/20/2019 08/08/19   Sherrie George, PA-C  ondansetron (ZOFRAN) 4 MG tablet Take 1 tablet (4 mg total) by mouth every 6 (six) hours. Patient not taking: Reported on 11/20/2019 08/09/19   Maczis, Elmer Sow, PA-C  oxyCODONE (OXY IR/ROXICODONE) 5 MG immediate release tablet Take 1 tablet (5 mg total) by mouth every 6 (six) hours as needed for severe pain (pain not relieved by Tylenol & ibuprofen). Patient not taking: Reported on 11/20/2019 08/08/19   Sherrie George, PA-C  therapeutic multivitamin-minerals [facility]) tablet Take 1 tablet by mouth daily. Patient not taking: Reported on 11/20/2019 08/08/19 08/07/20  Sherrie George, PA-C  vitamin C (ASCORBIC ACID) 500 MG tablet Take 1,000 mg by mouth daily.    [provider]    Allergies    Bee venom, Shellfish allergy, and Other  Review of Systems   Review of Systems  Constitutional: Positive for chills.       Positive for sweats which are now resolved @ present.   Respiratory: Positive for shortness of breath (resolved @ present). Negative for cough.   Cardiovascular: Negative for chest pain.  Gastrointestinal: Positive for diarrhea (loose stools) and rectal pain. Negative for abdominal pain, blood in stool, nausea and vomiting.  Genitourinary: Negative for dysuria, scrotal swelling and testicular pain.  All other systems reviewed and are negative.   Physical Exam Updated Vital Signs BP (!) 140/92 (BP Location: Left Arm)   Pulse 85   Temp 98.4 F (36.9 C) (Oral)   Resp 19   SpO2 100%   Physical Exam Vitals and  nursing note reviewed. Exam conducted with a chaperone present.  Constitutional:      General: He is not in acute distress.    Appearance: He is well-developed. He is not toxic-appearing.  HENT:     Head: Normocephalic and atraumatic.  Eyes:     General:        Right eye: No discharge.        Left eye: No discharge.     Conjunctiva/sclera: Conjunctivae normal.  Cardiovascular:     Rate and Rhythm: Normal rate and regular rhythm.  Pulmonary:     Effort: Pulmonary effort is normal. No respiratory distress.     Breath sounds: Normal breath sounds. No wheezing, rhonchi or rales.  Abdominal:     General: There is no distension.     Palpations: Abdomen is soft.     Tenderness: There is no abdominal tenderness.  Genitourinary:    Comments: RN spencer present  as chaperone.  L side seton drain in place. Very mild white drainage present. Patient tender to drain site and peri-anal region. Unable to perform DRE secondary to pain, overall limited exam secondary to patient clenching due to pain. No obvious blood.   Musculoskeletal:     Cervical back: Neck supple.  Skin:    General: Skin is warm and dry.     Findings: No rash.  Neurological:     Mental Status: He is alert.     Comments: Clear speech.   Psychiatric:        Behavior: Behavior normal.     ED Results / Procedures / Treatments   Labs (all labs ordered are listed, but only abnormal results are displayed) Labs Reviewed  CBC WITH DIFFERENTIAL/PLATELET - Abnormal; Notable for the following components:      Result Value   WBC 3.5 (*)    Hemoglobin 12.7 (*)    All other components within normal limits  COMPREHENSIVE METABOLIC PANEL    EKG None  Radiology CT PELVIS W CONTRAST  Result Date: 12/14/2019 CLINICAL DATA:  Abscess, perianal or rectal EXAM: CT PELVIS WITH CONTRAST TECHNIQUE: Multidetector CT imaging of the pelvis was performed using the standard protocol following the bolus administration of intravenous contrast.  CONTRAST:  170mL OMNIPAQUE IOHEXOL 300 MG/ML  SOLN COMPARISON:  August 06, 2019 FINDINGS: Urinary Tract: Urinary bladder is normal. No signs of distal ureteral dilation. Bowel: Seton in place in the left gluteal cleft extending towards the lower margin of the sphincter complex in the perianal soft tissues. Stranding surrounds the area of concern and there is no visible fluid. No signs of abscess along the pelvic floor or adjacent to the rectum. The appendix is seen in the right lower quadrant and is normal. Vascular/Lymphatic: Patent pelvic vasculature. Reproductive:  Prostate is unremarkable. Other:  None. Musculoskeletal: No suspicious bone lesions identified. IMPRESSION: Signs of drainage of a perianal abscess extending from the lower margin of the anal sphincter into the left gluteal cleft. Stranding surrounds the seton without visible fluid. Electronically Signed   By: Zetta Bills M.D.   On: 12/14/2019 17:00    Procedures Procedures (including critical care time)  Medications Ordered in ED Medications - No data to display  ED Course  I have reviewed the triage vital signs and the nursing notes.  Pertinent labs & imaging results that were available during my care of the patient were reviewed by me and considered in my medical decision making (see chart for details).    Victor Burns was evaluated in Emergency Department on 12/14/2019 for the symptoms described in the history of present illness. He/she was evaluated in the context of the global COVID-19 pandemic, which necessitated consideration that the patient might be at risk for infection with the SARS-CoV-2 virus that causes COVID-19. Institutional protocols and algorithms that pertain to the evaluation of patients at risk for COVID-19 are in a state of rapid change based on information released by regulatory bodies including the CDC and federal and state organizations. These policies and algorithms were followed during the patient's care in  the ED.  MDM Rules/Calculators/A&P                      Patient w/ hx of perirectal abscess taken to OR 08/2019 with seton drain placed presents with rectal pain over past 5-6 days. Nontoxic appearing, vitals WNL with the exception of elevated BP- doubt HTN emergency. On exam drain in place, very minimal  white drainage present, tenderness to palpation to peri-anal region especially near drain sites, patient refusing DRE, pain limits exam. Will discuss w/ general surgery on call.   13:00: CONSULT: Discussed with Leary Roca PA-C with general surgery - will discuss with attending & call back--> Re discussed, has spoken with Dr. Gerrit Friends- unlikely to have abscess @ drain site, as this is in place for prevention, if concern for higher up area may obtain CT pelvis for further assessment. Drain not to be removed- would be an elective procedure with plan for within the next few months with possible additional interventions as this is keeping fistula open and functioning to prevent abscess. PA-C has spoken with billing/office staff and scheduled patient for an appointment for follow up 01/09/2020 and provided discharge instructions for the patient. In the interim recommends 5-7 day course of flagyl as this may assist with patient's discomfort, would avoid topical anesthetics to avoid blockage of drain. Should we proceed w/ CT less likely an OR candidate with his recent positive COVID test. Appreciate consultation.   Shared decision making conversation with the patient regarding CT imaging, he is concerned this pain is abnormal, he would like to proceed with CT, given limited exam and having trouble with BM with proceed.   CBC: Mild leukopenia @ 3.5 likely secondary to COVID 19. Mild anemia CMP: Unremarkable.  CT: Signs of drainage of a perianal abscess extending from the lower margin of the anal sphincter into the left gluteal cleft. Stranding surrounds the seton without visible fluid.   --> No obvious area  to I&D in the ED on exam, drainage on CT. Will discharge home with flagyl as well as cipro & analgesics. General surgery follow up .I discussed results, treatment plan, need for follow-up, and return precautions with the patient. Provided opportunity for questions, patient confirmed understanding and is in agreement with plan.   Findings and plan of care discussed with supervising physician Dr. Jacqulyn Bath who is in agreement.   Final Clinical Impression(s) / ED Diagnoses Final diagnoses:  Perianal abscess    Rx / DC Orders ED Discharge Orders         Ordered    ciprofloxacin (CIPRO) 500 MG tablet  Every 12 hours     12/14/19 1739    metroNIDAZOLE (FLAGYL) 500 MG tablet  2 times daily     12/14/19 1739    HYDROcodone-acetaminophen (NORCO/VICODIN) 5-325 MG tablet  Every 6 hours PRN     12/14/19 1739           Arpan Eskelson, Rifton R, PA-C 12/14/19 1750    Long, Arlyss Repress, MD 12/15/19 0150

## 2020-02-13 ENCOUNTER — Ambulatory Visit: Payer: Self-pay | Admitting: Surgery

## 2020-02-13 NOTE — H&P (Signed)
CC: Postop f/u  HPI: Mr. Hamza is a very pleasant 27yoM history of recurrent perianal abscesses who has undergone multiple prior incision and drainages of these. He presented the hospital earlier this month with recurrent symptoms of an abscess. He was seen and examined and taken to the operating room on 08/08/2019 for anorectal EUA, I&D of perianal abscess, and placement of draining seton. He was found to have a left gluteal abscess which is spontaneously draining. The fistula was found that seemed to be somewhat superficial but at least involving the external sphincter apparatus. This was just distal to the dentate line just off to the left of the anterior midline. A blue vessel loop draining seton was placed. He recovered well from this and was discharged home the following day. His prior perianal abscesses have been in the same location.  INTERVAL HX He returns today for follow-up. Back in January, he had some purulent fluid draining from around his seton. He had been seen in emergency room for this and a CT scan demonstrated no active or residual abscess. He had been offered surgery back in November but had not scheduled.  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  ROS: A comprehensive 10 system review of systems was completed with the patient and pertinent findings as noted above.  The patient is a 29 year old male.   Allergies (Chanel Teressa Senter, CMA; 02/08/2020 3:11 PM) BEE VENOM  SHELLFISH  Anaphylaxis. Allergies Reconciled   Medication History (Chanel Teressa Senter, CMA; 02/08/2020 3:11 PM) Tylenol (325MG  Tablet, Oral) Active. Vitamin C (500MG  Tablet, Oral) Active. Medications Reconciled    Review of Systems Harrell Gave M. Trejon Duford MD; 02/08/2020 3:33 PM) General Present- Appetite Loss. Not Present- Chills, Fatigue, Fever, Night  Sweats, Weight Gain and Weight Loss. Skin Not Present- Change in Wart/Mole, Dryness, Hives, Jaundice, New Lesions, Non-Healing Wounds, Rash and Ulcer. HEENT Not Present- Earache, Hearing Loss, Hoarseness, Nose Bleed, Oral Ulcers, Ringing in the Ears, Seasonal Allergies, Sinus Pain, Sore Throat, Visual Disturbances, Wears glasses/contact lenses and Yellow Eyes. Respiratory Not Present- Bloody sputum, Chronic Cough, Difficulty Breathing, Snoring and Wheezing. Breast Not Present- Breast Mass, Breast Pain, Nipple Discharge and Skin Changes. Cardiovascular Not Present- Chest Pain, Difficulty Breathing Lying Down, Leg Cramps, Palpitations, Rapid Heart Rate, Shortness of Breath and Swelling of Extremities. Gastrointestinal Present- Change in Bowel Habits and Rectal Pain. Not Present- Abdominal Pain, Bloating, Bloody Stool, Chronic diarrhea, Constipation, Difficulty Swallowing, Excessive gas, Gets full quickly at meals, Hemorrhoids, Indigestion, Nausea and Vomiting. Psychiatric Not Present- Anxiety and Depression. Endocrine Not Present- Appetite Changes and Cold Intolerance. Hematology Not Present- Blood Clots and Blood Thinners.  Vitals ([name] CMA; 02/08/2020 3:11 PM) 02/08/2020 3:11 PM Weight: 217.5 lb Height: 71in Body Surface Area: 2.19 m Body Mass Index: 30.33 kg/m  Temp.: 97.41F  Pulse: 120 (Regular)  BP: 124/74 (Sitting, Left Arm, Standard)       Physical Exam Harrell Gave M. Andres Vest MD; 02/08/2020 3:33 PM) The physical exam findings are as follows: Note:Constitutional: No acute distress; conversant; wearing surgical mask Lungs: Normal respiratory effort CV: RRR GI: Abdomen soft, nontender, nondistended Anorectal: Blue vessel loop seton in place- draining seton, appropriately loose. Wound has contracted down to a single small external opening. There is no fluctuance or purulent drainage. There is no erythema. MSK: Normal gait **A chaperone, [employer], was  present for this  encounter    Assessment & Plan [name] M. Zarriah Starkel MD; 02/08/2020 3:36 PM) ANAL FISTULA (K60.3) Story: Mr. 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 He is status post incision and drainage and seton placement 08/08/2019. Impression: -The anatomy and physiology of the anal canal was discussed at length with the patient. The pathophysiology of anal abscess and fistula was discussed at length with associated pictures and illustrations. -He requested seton removal in the office today. I had advised against this and informed him of increased risk of recurrent perianal abscesses and need for further drainage procedures etc. He states it has been uncomfortable and is tired of having this. I discussed that he was recommended surgery back in November but he had not scheduled. At this time, he acknowledges all these risks and is requested to consider proceeding with a definitive surgery such as LIFT but stated he wanted the seton out today. We discussed if he were to have active abscess develop, inability to proceed with having a LIFT at that time but would need repeat I&D and even potentially seton based on findings. -We removed the seton at his request. -We discussed ligation of intersphincteric fistulous tract, anorectal exam under anesthesia again today -The planned procedure, material risks (including, but not limited to, pain, bleeding, infection, scarring, need for blood transfusion, damage to anal sphincter, incontinence of gas and/or stool, need for additional procedures, recurrence rates of 20-30% following LIFT, pneumonia, heart attack, stroke, death) benefits and alternatives to surgery were discussed at length. I noted a good probability that the procedure would help improve his symptoms by reducing risk of recurrent abscesses/fistula. The patient's questions were answered to his satisfaction, e  voiced understanding and elected to proceed with surgery. Additionally, we discussed typical postoperative expectations and the recovery process.  This patient encounter took 22 minutes today to perform the following: take history, perform exam, review outside records, interpret imaging, counsel the patient on their diagnosis and document encounter, findings & plan in the EHR  Signed by Andria Meuse, MD (02/08/2020 3:36 PM)

## 2020-02-13 NOTE — H&P (View-Only) (Signed)
CC: Postop f/u  HPI: [name] is a very pleasant 27yoM history of recurrent perianal abscesses who has undergone multiple prior incision and drainages of these. He presented the hospital earlier this month with recurrent symptoms of an abscess. He was seen and examined and taken to the operating room on 08/08/2019 for anorectal EUA, I&D of perianal abscess, and placement of draining seton. He was found to have a left gluteal abscess which is spontaneously draining. The fistula was found that seemed to be somewhat superficial but at least involving the external sphincter apparatus. This was just distal to the dentate line just off to the left of the anterior midline. A blue vessel loop draining seton was placed. He recovered well from this and was discharged home the following day. His prior perianal abscesses have been in the same location.  INTERVAL HX He returns today for follow-up. Back in January, he had some purulent fluid draining from around his seton. He had been seen in emergency room for this and a CT scan demonstrated no active or residual abscess. He had been offered surgery back in November but had not scheduled.  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  ROS: A comprehensive 10 system review of systems was completed with the patient and pertinent findings as noted above.  The patient is a 28 year old male.   Allergies (Victor Burns, Burns; 02/08/2020 3:11 Burns) BEE VENOM  SHELLFISH  Anaphylaxis. Allergies Reconciled   Medication History (Victor Burns, Burns; 02/08/2020 3:11 Burns) Tylenol (325MG Burns, Oral) Active. Vitamin C (500MG Burns, Oral) Active. Medications Reconciled    Review of Systems (Victor Burns M. Victor Snedeker Burns; 02/08/2020 3:33 Burns) General Present- Appetite Loss. Not Present- Chills, Fatigue, Fever, Night  Sweats, Weight Gain and Weight Loss. Skin Not Present- Change in Wart/Mole, Dryness, Hives, Jaundice, New Lesions, Non-Healing Wounds, Rash and Ulcer. HEENT Not Present- Earache, Hearing Loss, Hoarseness, Nose Bleed, Oral Ulcers, Ringing in the Ears, Seasonal Allergies, Sinus Pain, Sore Throat, Visual Disturbances, Wears glasses/contact lenses and Yellow Eyes. Respiratory Not Present- Bloody sputum, Chronic Cough, Difficulty Breathing, Snoring and Wheezing. Breast Not Present- Breast Mass, Breast Pain, Nipple Discharge and Skin Changes. Cardiovascular Not Present- Chest Pain, Difficulty Breathing Lying Down, Leg Cramps, Palpitations, Rapid Heart Rate, Shortness of Breath and Swelling of Extremities. Gastrointestinal Present- Change in Bowel Habits and Rectal Pain. Not Present- Abdominal Pain, Bloating, Bloody Stool, Chronic diarrhea, Constipation, Difficulty Swallowing, Excessive gas, Gets full quickly at meals, Hemorrhoids, Indigestion, Nausea and Vomiting. Psychiatric Not Present- Anxiety and Depression. Endocrine Not Present- Appetite Changes and Cold Intolerance. Hematology Not Present- Blood Clots and Blood Thinners.  Vitals (Victor Burns) 02/08/2020 3:11 Burns Weight: 217.5 lb Height: 71in Body Surface Area: 2.19 m Body Mass Index: 30.33 kg/m  Temp.: 97.1F  Pulse: 120 (Regular)  BP: 124/74 (Sitting, Left Arm, Standard)       Physical Exam (Victor Burns M. Victor Hommes Burns; 02/08/2020 3:33 Burns) The physical exam findings are as follows: Note:Constitutional: No acute distress; conversant; wearing surgical mask Lungs: Normal respiratory effort CV: RRR GI: Abdomen soft, nontender, nondistended Anorectal: Blue vessel loop seton in place- draining seton, appropriately loose. Wound has contracted down to a single small external opening. There is no fluctuance or purulent drainage. There is no erythema. 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 He is status post incision and drainage and seton placement 08/08/2019. Impression: -The anatomy and physiology of the anal canal was discussed at length with the patient. The pathophysiology of anal abscess and fistula was discussed at length with associated pictures and illustrations. -He requested seton removal in the office today. I had advised against this and informed him of increased risk of recurrent perianal abscesses and need for further drainage procedures etc. He states it has been uncomfortable and is tired of having this. I discussed that he was recommended surgery back in November but he had not scheduled. At this time, he acknowledges all these risks and is requested to consider proceeding with a definitive surgery such as LIFT but stated he wanted the seton out today. We discussed if he were to have active abscess develop, inability to proceed with having a LIFT at that time but would need repeat I&D and even potentially seton based on findings. -We removed the seton at his request. -We discussed ligation of intersphincteric fistulous tract, anorectal exam under anesthesia again today -The planned procedure, material risks (including, but not limited to, pain, bleeding, infection, scarring, need for blood transfusion, damage to anal sphincter, incontinence of gas and/or stool, need for additional procedures, recurrence rates of 20-30% following LIFT, pneumonia, heart attack, stroke, death) benefits and alternatives to surgery were discussed at length. I noted a good probability that the procedure would help improve his symptoms by reducing risk of recurrent abscesses/fistula. The patient's questions were answered to his satisfaction, e  voiced understanding and elected to proceed with surgery. Additionally, we discussed typical postoperative expectations and the recovery process.  This patient encounter took 22 minutes today to perform the following: take history, perform exam, review outside records, interpret imaging, counsel the patient on their diagnosis and document encounter, findings & plan in the EHR  Signed by Andria Meuse, Burns (02/08/2020 3:36 Burns)

## 2020-02-18 ENCOUNTER — Encounter (HOSPITAL_COMMUNITY)
Admission: RE | Admit: 2020-02-18 | Discharge: 2020-02-18 | Disposition: A | Discharge status: Home / Self Care | Payer: 59 | Source: Ambulatory Visit | Attending: Surgery | Admitting: Surgery

## 2020-02-18 ENCOUNTER — Other Ambulatory Visit: Payer: Self-pay

## 2020-02-18 ENCOUNTER — Encounter (HOSPITAL_COMMUNITY): Payer: Self-pay

## 2020-02-18 DIAGNOSIS — Z01812 Encounter for preprocedural laboratory examination: Secondary | ICD-10-CM | POA: Insufficient documentation

## 2020-02-18 NOTE — Patient Instructions (Signed)
DUE TO COVID-19 ONLY ONE VISITOR IS ALLOWED TO COME WITH YOU AND STAY IN THE WAITING ROOM ONLY DURING PRE OP AND PROCEDURE DAY OF SURGERY. THE 1 VISITOR MAY VISIT WITH YOU AFTER SURGERY IN YOUR PRIVATE ROOM DURING VISITING HOURS ONLY!  YOU NEED TO HAVE A COVID 19 TEST ON_______ @_______ , THIS TEST MUST BE DONE BEFORE SURGERY, COME  [address], [place] , .  [facility]) ONCE YOUR COVID TEST IS COMPLETED, PLEASE BEGIN THE QUARANTINE INSTRUCTIONS AS OUTLINED IN YOUR HANDOUT.                Keaton Ciolek   Your procedure is scheduled on: 02/20/20   Report to [facility] Main  Entrance   Report to admitting at  11:25 AM     Call this number if you have problems the morning of surgery [phone]    Remember: Do not eat food or drink liquids  after Midnight.    BRUSH YOUR TEETH MORNING OF SURGERY AND RINSE YOUR MOUTH OUT, NO CHEWING GUM CANDY OR MINTS.     Take these medicines the morning of surgery with A SIP OF WATER: Use your inhaler and bring it with you to the hospital                                 You may not have any metal on your body including              piercings  Do not wear jewelry, lotions, powders or deodorant     .              Men may shave face and neck.   Do not bring valuables to the hospital. [facility] IS NOT             RESPONSIBLE   FOR VALUABLES.  Contacts, dentures or bridgework may not be worn into surgery.      Patients discharged the day of surgery will not be allowed to drive home.   IF YOU ARE HAVING SURGERY AND GOING HOME THE SAME DAY, YOU MUST HAVE AN ADULT TO DRIVE YOU HOME AND BE WITH YOU FOR 24 HOURS  . YOU MAY GO HOME BY TAXI OR UBER OR ORTHERWISE, BUT AN ADULT MUST ACCOMPANY YOU HOME AND STAY WITH YOU FOR 24 HOURS.  Name and phone number of your driver:  Special Instructions: N/A              Please read over the following fact sheets you were  given: _____________________________________________________________________             [facility] - Preparing for Surgery  Before surgery, you can play an important role.   Because skin is not sterile, your skin needs to be as free of germs as possible.   You can reduce the number of germs on your skin by washing with CHG (chlorahexidine gluconate) soap before surgery.   CHG is an antiseptic cleaner which kills germs and bonds with the skin to continue killing germs even after washing. Please DO NOT use if you have an allergy to CHG or antibacterial soaps.   If your skin becomes reddened/irritated stop using the CHG and inform your nurse when you arrive at Short Stay. .  You may shave your face/neck.  Please follow these instructions carefully:  1.  Shower with CHG Soap the night before surgery  and the  morning of Surgery.  2.  If you choose to wash your hair, wash your hair first as usual with your  normal  shampoo.  3.  After you shampoo, rinse your hair and body thoroughly to remove the  shampoo.                                        4.  Use CHG as you would any other liquid soap.  You can apply chg directly  to the skin and wash                       Gently with a scrungie or clean washcloth.  5.  Apply the CHG Soap to your body ONLY FROM THE NECK DOWN.   Do not use on face/ open                           Wound or open sores. Avoid contact with eyes, ears mouth and genitals (private parts).                       Wash face,  Genitals (private parts) with your normal soap.             6.  Wash thoroughly, paying special attention to the area where your surgery  will be performed.  7.  Thoroughly rinse your body with warm water from the neck down.  8.  DO NOT shower/wash with your normal soap after using and rinsing off  the CHG Soap.             9.  Pat yourself dry with a clean towel.            10.  Wear clean pajamas.            11.  Place clean sheets on your bed the night of  your first shower and do not  sleep with pets. Day of Surgery : Do not apply any lotions/deodorants the morning of surgery.  Please wear clean clothes to the hospital/surgery center.  FAILURE TO FOLLOW THESE INSTRUCTIONS MAY RESULT IN THE CANCELLATION OF YOUR SURGERY PATIENT SIGNATURE_________________________________  NURSE SIGNATURE__________________________________  ________________________________________________________________________

## 2020-02-18 NOTE — Progress Notes (Signed)
PCP - uses urgent care Cardiologist - no  Chest x-ray - no EKG - no Stress Test - no ECHO - no Cardiac Cath - no  Sleep Study -no  CPAP -   Fasting Blood Sugar -NA  Checks Blood Sugar _____ times a day  Blood Thinner Instructions:NA Aspirin Instructions: Last Dose:  Anesthesia review:   Patient denies shortness of breath, fever, cough and chest pain at PAT appointment yes  Patient verbalized understanding of instructions that were given to them at the PAT appointment. Patient was also instructed that they will need to review over the PAT instructions again at home before surgery. yes

## 2020-02-19 ENCOUNTER — Encounter (HOSPITAL_COMMUNITY)
Admission: RE | Admit: 2020-02-19 | Discharge: 2020-02-19 | Disposition: A | Discharge status: Home / Self Care | Payer: 59 | Source: Ambulatory Visit | Attending: Surgery | Admitting: Surgery

## 2020-02-19 MED ORDER — BUPIVACAINE LIPOSOME 1.3 % IJ SUSP
20.0000 mL | Freq: Once | INTRAMUSCULAR | Status: DC
  Filled 2020-02-19: qty 20

## 2020-02-19 NOTE — Progress Notes (Signed)
Pt did not come for his lab visit today. I made 2 attempts to get a hold of him and left a message.   I reviewed his instructions on the phone on 3/15. CBC will be done on DOS

## 2020-02-20 ENCOUNTER — Ambulatory Visit (HOSPITAL_COMMUNITY)
Admission: RE | Admit: 2020-02-20 | Discharge: 2020-02-20 | Disposition: A | Discharge status: Home / Self Care | Payer: 59 | Source: Ambulatory Visit | Attending: Surgery | Admitting: Surgery

## 2020-02-20 ENCOUNTER — Ambulatory Visit (HOSPITAL_COMMUNITY): Payer: 59 | Admitting: Certified Registered Nurse Anesthetist

## 2020-02-20 ENCOUNTER — Encounter (HOSPITAL_COMMUNITY)
Admission: RE | Disposition: A | Discharge status: Home / Self Care | Payer: Self-pay | Source: Ambulatory Visit | Attending: Surgery

## 2020-02-20 ENCOUNTER — Other Ambulatory Visit: Payer: Self-pay

## 2020-02-20 ENCOUNTER — Encounter (HOSPITAL_COMMUNITY): Payer: Self-pay | Admitting: Surgery

## 2020-02-20 ENCOUNTER — Telehealth (HOSPITAL_COMMUNITY): Payer: Self-pay | Admitting: *Deleted

## 2020-02-20 DIAGNOSIS — J45909 Unspecified asthma, uncomplicated: Secondary | ICD-10-CM | POA: Insufficient documentation

## 2020-02-20 DIAGNOSIS — Z91030 Bee allergy status: Secondary | ICD-10-CM | POA: Diagnosis not present

## 2020-02-20 DIAGNOSIS — K603 Anal fistula: Secondary | ICD-10-CM | POA: Insufficient documentation

## 2020-02-20 DIAGNOSIS — Z79899 Other long term (current) drug therapy: Secondary | ICD-10-CM | POA: Diagnosis not present

## 2020-02-20 DIAGNOSIS — F17200 Nicotine dependence, unspecified, uncomplicated: Secondary | ICD-10-CM | POA: Diagnosis not present

## 2020-02-20 DIAGNOSIS — Z91013 Allergy to seafood: Secondary | ICD-10-CM | POA: Diagnosis not present

## 2020-02-20 HISTORY — PX: FISTULOTOMY: SHX6413

## 2020-02-20 HISTORY — PX: RECTAL EXAM UNDER ANESTHESIA: SHX6399

## 2020-02-20 LAB — CBC
HCT: 37 % — ABNORMAL LOW (ref 39.0–52.0)
Hemoglobin: 11.8 g/dL — ABNORMAL LOW (ref 13.0–17.0)
MCH: 29.8 pg (ref 26.0–34.0)
MCHC: 31.9 g/dL (ref 30.0–36.0)
MCV: 93.4 fL (ref 80.0–100.0)
Platelets: 293 10*3/uL (ref 150–400)
RBC: 3.96 MIL/uL — ABNORMAL LOW (ref 4.22–5.81)
RDW: 13.2 % (ref 11.5–15.5)
WBC: 4.1 10*3/uL (ref 4.0–10.5)
nRBC: 0 % (ref 0.0–0.2)

## 2020-02-20 SURGERY — EXAM UNDER ANESTHESIA, RECTUM
Anesthesia: General | Site: Rectum

## 2020-02-20 MED ORDER — ONDANSETRON HCL 4 MG/2ML IJ SOLN
INTRAMUSCULAR | Status: DC | PRN
  Administered 2020-02-20: 4 mg via INTRAVENOUS

## 2020-02-20 MED ORDER — HYDROMORPHONE HCL 1 MG/ML IJ SOLN
INTRAMUSCULAR | Status: AC
  Filled 2020-02-20: qty 1

## 2020-02-20 MED ORDER — SUGAMMADEX SODIUM 200 MG/2ML IV SOLN
INTRAVENOUS | Status: DC | PRN
  Administered 2020-02-20: 200 mg via INTRAVENOUS

## 2020-02-20 MED ORDER — SUGAMMADEX SODIUM 500 MG/5ML IV SOLN
INTRAVENOUS | Status: AC
  Filled 2020-02-20: qty 5

## 2020-02-20 MED ORDER — ONDANSETRON HCL 4 MG/2ML IJ SOLN
INTRAMUSCULAR | Status: AC
  Filled 2020-02-20: qty 2

## 2020-02-20 MED ORDER — DEXAMETHASONE SODIUM PHOSPHATE 10 MG/ML IJ SOLN
INTRAMUSCULAR | Status: AC
  Filled 2020-02-20: qty 1

## 2020-02-20 MED ORDER — METRONIDAZOLE IN NACL 5-0.79 MG/ML-% IV SOLN
500.0000 mg | INTRAVENOUS | Status: AC
  Administered 2020-02-20: 500 mg via INTRAVENOUS
  Filled 2020-02-20: qty 100

## 2020-02-20 MED ORDER — PROPOFOL 10 MG/ML IV BOLUS
INTRAVENOUS | Status: DC | PRN
  Administered 2020-02-20: 200 mg via INTRAVENOUS

## 2020-02-20 MED ORDER — FENTANYL CITRATE (PF) 100 MCG/2ML IJ SOLN
INTRAMUSCULAR | Status: AC
  Filled 2020-02-20: qty 2

## 2020-02-20 MED ORDER — MIDAZOLAM HCL 2 MG/2ML IJ SOLN: 0.5000 mg | Freq: Once | INTRAMUSCULAR | Status: DC | PRN

## 2020-02-20 MED ORDER — FENTANYL CITRATE (PF) 100 MCG/2ML IJ SOLN
INTRAMUSCULAR | Status: DC | PRN
  Administered 2020-02-20 (×4): 50 ug via INTRAVENOUS

## 2020-02-20 MED ORDER — HYDROMORPHONE HCL 1 MG/ML IJ SOLN
0.2500 mg | INTRAMUSCULAR | Status: DC | PRN
  Administered 2020-02-20: 0.25 mg via INTRAVENOUS

## 2020-02-20 MED ORDER — BUPIVACAINE-EPINEPHRINE (PF) 0.25% -1:200000 IJ SOLN
INTRAMUSCULAR | Status: DC | PRN
  Administered 2020-02-20: 30 mL

## 2020-02-20 MED ORDER — LACTATED RINGERS IV SOLN: INTRAVENOUS | Status: DC

## 2020-02-20 MED ORDER — ACETAMINOPHEN 500 MG PO TABS
1000.0000 mg | ORAL_TABLET | ORAL | Status: AC
  Administered 2020-02-20: 1000 mg via ORAL
  Filled 2020-02-20: qty 2

## 2020-02-20 MED ORDER — CHLORHEXIDINE GLUCONATE CLOTH 2 % EX PADS
6.0000 | MEDICATED_PAD | Freq: Once | CUTANEOUS | Status: AC
  Administered 2020-02-20: 6 via TOPICAL

## 2020-02-20 MED ORDER — ROCURONIUM BROMIDE 50 MG/5ML IV SOSY
PREFILLED_SYRINGE | INTRAVENOUS | Status: DC | PRN
  Administered 2020-02-20: 60 mg via INTRAVENOUS

## 2020-02-20 MED ORDER — ACETAMINOPHEN 500 MG PO TABS: 1000.0000 mg | ORAL_TABLET | Freq: Four times a day (QID) | ORAL | 0 refills | Status: DC | PRN

## 2020-02-20 MED ORDER — PROPOFOL 10 MG/ML IV BOLUS
INTRAVENOUS | Status: AC
  Filled 2020-02-20: qty 40

## 2020-02-20 MED ORDER — BUPIVACAINE-EPINEPHRINE (PF) 0.5% -1:200000 IJ SOLN
INTRAMUSCULAR | Status: AC
  Filled 2020-02-20: qty 1.8

## 2020-02-20 MED ORDER — PROMETHAZINE HCL 25 MG/ML IJ SOLN
INTRAMUSCULAR | Status: AC
  Filled 2020-02-20: qty 1

## 2020-02-20 MED ORDER — DIBUCAINE (PERIANAL) 1 % EX OINT
TOPICAL_OINTMENT | CUTANEOUS | Status: DC | PRN
  Administered 2020-02-20: 1 via RECTAL

## 2020-02-20 MED ORDER — LIDOCAINE 2% (20 MG/ML) 5 ML SYRINGE
INTRAMUSCULAR | Status: DC | PRN
  Administered 2020-02-20: 60 mg via INTRAVENOUS

## 2020-02-20 MED ORDER — BUPIVACAINE HCL 0.25 % IJ SOLN
INTRAMUSCULAR | Status: AC
  Filled 2020-02-20: qty 1

## 2020-02-20 MED ORDER — SODIUM CHLORIDE 0.9 % IV SOLN
2.0000 g | INTRAVENOUS | Status: AC
  Administered 2020-02-20: 2 g via INTRAVENOUS
  Filled 2020-02-20: qty 20

## 2020-02-20 MED ORDER — DEXAMETHASONE SODIUM PHOSPHATE 10 MG/ML IJ SOLN
INTRAMUSCULAR | Status: DC | PRN
  Administered 2020-02-20: 10 mg via INTRAVENOUS

## 2020-02-20 MED ORDER — BUPIVACAINE LIPOSOME 1.3 % IJ SUSP
INTRAMUSCULAR | Status: DC | PRN
  Administered 2020-02-20: 20 mL

## 2020-02-20 MED ORDER — MIDAZOLAM HCL 5 MG/5ML IJ SOLN
INTRAMUSCULAR | Status: DC | PRN
  Administered 2020-02-20: 2 mg via INTRAVENOUS

## 2020-02-20 MED ORDER — PROMETHAZINE HCL 25 MG/ML IJ SOLN
6.2500 mg | INTRAMUSCULAR | Status: DC | PRN
  Administered 2020-02-20: 6.25 mg via INTRAVENOUS

## 2020-02-20 MED ORDER — DIBUCAINE (PERIANAL) 1 % EX OINT
TOPICAL_OINTMENT | CUTANEOUS | Status: AC
  Filled 2020-02-20: qty 28

## 2020-02-20 MED ORDER — OXYCODONE HCL 5 MG PO TABS: 5.0000 mg | ORAL_TABLET | Freq: Four times a day (QID) | ORAL | 0 refills | Status: DC | PRN

## 2020-02-20 MED ORDER — MIDAZOLAM HCL 2 MG/2ML IJ SOLN
INTRAMUSCULAR | Status: AC
  Filled 2020-02-20: qty 2

## 2020-02-20 MED ORDER — MEPERIDINE HCL 50 MG/ML IJ SOLN: 6.2500 mg | INTRAMUSCULAR | Status: DC | PRN

## 2020-02-20 SURGICAL SUPPLY — 30 items
BRIEF STRETCH FOR OB PAD LRG (UNDERPADS AND DIAPERS) ×3 IMPLANT
COVER WAND RF STERILE (DRAPES) IMPLANT
DECANTER SPIKE VIAL GLASS SM (MISCELLANEOUS) ×3 IMPLANT
DRSG PAD ABDOMINAL 8X10 ST (GAUZE/BANDAGES/DRESSINGS) ×1 IMPLANT
ELECT REM PT RETURN 15FT ADLT (MISCELLANEOUS) ×2 IMPLANT
GAUZE SPONGE 4X4 12PLY STRL (GAUZE/BANDAGES/DRESSINGS) ×1 IMPLANT
GLOVE BIO SURGEON STRL SZ7.5 (GLOVE) ×3 IMPLANT
GLOVE INDICATOR 8.0 STRL GRN (GLOVE) ×3 IMPLANT
GOWN STRL REUS W/TWL XL LVL3 (GOWN DISPOSABLE) ×6 IMPLANT
KIT BASIN OR (CUSTOM PROCEDURE TRAY) ×3 IMPLANT
KIT TURNOVER KIT A (KITS) ×1 IMPLANT
LOOP VESSEL MINI RED (MISCELLANEOUS) IMPLANT
NEEDLE HYPO 22GX1.5 SAFETY (NEEDLE) ×3 IMPLANT
PACK GENERAL/GYN (CUSTOM PROCEDURE TRAY) ×3 IMPLANT
PENCIL SMOKE EVACUATOR (MISCELLANEOUS) ×1 IMPLANT
SHEARS HARMONIC 9CM CVD (BLADE) IMPLANT
SPONGE SURGIFOAM ABS GEL 100 (HEMOSTASIS) IMPLANT
SURGILUBE 2OZ TUBE FLIPTOP (MISCELLANEOUS) ×3 IMPLANT
SUT CHROMIC 2 0 SH (SUTURE) IMPLANT
SUT CHROMIC 3 0 SH 27 (SUTURE) IMPLANT
SUT MNCRL AB 4-0 PS2 18 (SUTURE) ×3 IMPLANT
SUT SILK 0 (SUTURE) ×1
SUT SILK 0 30XBRD TIE 6 (SUTURE) IMPLANT
SUT VIC AB 2-0 CT2 27 (SUTURE) ×1 IMPLANT
SUT VIC AB 3-0 SH 27 (SUTURE) ×1
SUT VIC AB 3-0 SH 27X BRD (SUTURE) ×2 IMPLANT
SYR 20ML LL LF (SYRINGE) ×3 IMPLANT
TOWEL OR 17X26 10 PK STRL BLUE (TOWEL DISPOSABLE) ×3 IMPLANT
TOWEL OR NON WOVEN STRL DISP B (DISPOSABLE) ×3 IMPLANT
VESSEL LOOPS MINI RED (MISCELLANEOUS) ×1

## 2020-02-20 NOTE — Progress Notes (Signed)
I have tried to contact his friend at his request following his procedure twice now to no avail, [place].   Marin Olp, M.D. [facility] Use [email or web address] to contact on call provider

## 2020-02-20 NOTE — Interval H&P Note (Signed)
History and Physical Interval Note:  02/20/2020 12:07 PM  Victor Burns  has presented today for surgery, with the diagnosis of ANAL FISTULA.  The various methods of treatment have been discussed with the patient and family. After consideration of risks, benefits and other options for treatment, the patient has consented to  Procedure(s): ANORECTAL EXAM UNDER ANESTHESIA (N/A) POSSIBLE FISTULOTOMY (N/A) POSSIBLE LIGATION OF INTERNAL FISTULA TRACT VS ENDORECTAL ADVANCEMENT FLAP (N/A) as a surgical intervention.  The patient's history has been reviewed, patient examined, no change in status, stable for surgery.  I have reviewed the patient's chart and labs.  Questions were answered to the patient's satisfaction.     Stephanie Coup Clarice Bonaventure

## 2020-02-20 NOTE — Discharge Instructions (Addendum)
ANORECTAL SURGERY: POST OP INSTRUCTIONS  A partial fistulotomy was carried out and a cutting seton placed. This will likely work its way through slowly and eventually pass on its own.  1. DIET: Follow a light bland diet the first 24 hours after arrival home, such as soup, liquids, crackers, etc.  Be sure to include lots of fluids daily.  Avoid fast food or heavy meals as your are more likely to get nauseated.  Eat a low fat diet the next few days after surgery.    2. Take your usually prescribed home medications unless otherwise directed.  3. PAIN CONTROL: a. It is helpful to take an over-the-counter pain medication regularly for the first few days/weeks.  Choose from the following that works best for you: i. Ibuprofen (Advil, etc) Three 200mg  tabs every 6 hours as needed. ii. Acetaminophen (Tylenol, etc) 500-650mg  every 6 hours as needed iii. NOTE: You may take both of these medications together - most patients find it most helpful when alternating between the two (i.e. Ibuprofen at 6am, tylenol at 9am, ibuprofen at 12pm ...) b. A  prescription for pain medication may have been prescribed for you at discharge.  Take your pain medication as prescribed.  i. If you are having problems/concerns with the prescription medicine, please call for further advice.  4. Avoid getting constipated.  Between the surgery and the pain medications, it is common to experience some constipation.  Increasing fluid intake (64oz of water per day) and taking a fiber supplement (such as Metamucil, Citrucel, FiberCon) 1-2 times a day regularly will usually help prevent this problem from occurring.  Take Miralax (over the counter) 1-2x/day while taking a narcotic pain medication. If no bowel movement after 48hours, you may additionally take a laxative like a bottle of Milk of Magnesia which can be purchased over the counter. Avoid enemas if possible as these are often painful.   5. Watch out for diarrhea.  If you have  many loose bowel movements, simplify your diet to bland foods.  Stop any stool softeners and decrease your fiber supplement. If this worsens or does not improve, please call us.  6. Wash / shower every day.  If you were discharged with a dressing, you may remove this the day after your surgery. You may shower normally, getting soap/water on your wound, particularly after bowel movements.  7. Soaking in a warm bath filled a couple inches ("Sitz bath") is a great way to clean the area after a bowel movement and many patients find it is a way to soothe the area.  8. ACTIVITIES as tolerated:   a. You may resume regular (light) daily activities beginning the next day--such as daily self-care, walking, climbing stairs--gradually increasing activities as tolerated.  If you can walk 30 minutes without difficulty, it is safe to try more intense activity such as jogging, treadmill, bicycling, low-impact aerobics, etc. b. Refrain from any heavy lifting or straining for the first 2 weeks after your procedure, particularly if your surgery was for hemorrhoids. c. Avoid activities that make your pain worse d. You may drive when you are no longer taking prescription pain medication, you can comfortably wear a seatbelt, and you can safely maneuver your car and apply brakes.  9. FOLLOW UP in our office a. Please call [facility] at [phone] to set up an appointment to see your surgeon in the office for a follow-up appointment approximately 2 weeks after your surgery. b. Make sure that you call for this appointment  the day you arrive home to insure a convenient appointment time.  9. If you have disability or family leave forms that need to be completed, you may have them completed by your primary care physician's office; for return to work instructions, please ask our office staff and they will be happy to assist you in obtaining this documentation   When to call us [phone]: 1. Poor pain control 2. Reactions /  problems with new medications (rash/itching, etc)  3. Fever over 101.5 F (38.5 C) 4. Inability to urinate 5. Nausea/vomiting 6. Worsening swelling or bruising 7. Continued bleeding from incision. 8. Increased pain, redness, or drainage from the incision  The clinic staff is available to answer your questions during regular business hours (8:30am-5pm).  Please don't hesitate to call and ask to speak to one of our nurses for clinical concerns.   A surgeon from [facility] is always on call at the hospitals   If you have a medical emergency, go to the nearest emergency room or call 911.   [facility], [place], [place], [place], [place]  [address] ? MAIN: [phone] FAX [phone] [email or web address]

## 2020-02-20 NOTE — Anesthesia Postprocedure Evaluation (Signed)
Anesthesia Post Note  Patient: Victor Burns  Procedure(s) Performed: ANORECTAL EXAM UNDER ANESTHESIA (N/A Rectum) PARTIAL  FISTULOTOMY WITH PLACEMENT OF CUTTING TETON (N/A Rectum)     Patient location during evaluation: PACU Anesthesia Type: General Level of consciousness: awake and alert Pain management: pain level controlled Vital Signs Assessment: post-procedure vital signs reviewed and stable Respiratory status: spontaneous breathing, nonlabored ventilation, respiratory function stable and patient connected to nasal cannula oxygen Cardiovascular status: blood pressure returned to baseline and stable Postop Assessment: no apparent nausea or vomiting Anesthetic complications: no    Last Vitals:  Vitals:   02/20/20 1400 02/20/20 1415  BP: 139/90 133/87  Pulse: 70 (!) 51  Resp: 12 14  Temp:    SpO2: 99% 98%    Last Pain:  Vitals:   02/20/20 1415  TempSrc:   PainSc: Asleep                 Vartan Kerins S

## 2020-02-20 NOTE — Interval H&P Note (Signed)
History and Physical Interval Note:  02/20/2020 12:19 PM  Victor Burns  has presented today for surgery, with the diagnosis of ANAL FISTULA.  The various methods of treatment have been discussed with the patient and family. After consideration of risks, benefits and other options for treatment, the patient has consented to  Procedure(s): ANORECTAL EXAM UNDER ANESTHESIA (N/A) POSSIBLE FISTULOTOMY (N/A) POSSIBLE LIGATION OF INTERNAL FISTULA TRACT VS ENDORECTAL ADVANCEMENT FLAP (N/A) as a surgical intervention. We also discussed possibility of fistulotomy or cutting seton placement and expectations there in - all based on intraoperative findings. The patient's history has been reviewed, patient examined, no change in status, stable for surgery.  I have reviewed the patient's chart and labs.  Questions were answered to the patient's satisfaction.     Stephanie Coup Juliana Boling

## 2020-02-20 NOTE — Anesthesia Preprocedure Evaluation (Addendum)
Anesthesia Evaluation  Patient identified by MRN, date of birth, ID band Patient awake    Reviewed: Allergy & Precautions, NPO status , Patient's Chart, lab work & pertinent test results  Airway Mallampati: II  TM Distance: >3 FB Neck ROM: Full    Dental no notable dental hx.    Pulmonary neg pulmonary ROS, asthma , Current Smoker and Patient abstained from smoking.,  12/12/2019 novel coronavirus POS   Pulmonary exam normal breath sounds clear to auscultation       Cardiovascular negative cardio ROS Normal cardiovascular exam Rhythm:Regular Rate:Normal     Neuro/Psych negative neurological ROS  negative psych ROS   GI/Hepatic negative GI ROS, Neg liver ROS,   Endo/Other  negative endocrine ROS  Renal/GU negative Renal ROS  negative genitourinary   Musculoskeletal negative musculoskeletal ROS (+)   Abdominal   Peds negative pediatric ROS (+)  Hematology negative hematology ROS (+)   Anesthesia Other Findings   Reproductive/Obstetrics negative OB ROS                            Anesthesia Physical Anesthesia Plan  ASA: II  Anesthesia Plan: General   Post-op Pain Management:    Induction: Intravenous  PONV Risk Score and Plan: 1 and Ondansetron and Dexamethasone  Airway Management Planned: Oral ETT  Additional Equipment:   Intra-op Plan:   Post-operative Plan:   Informed Consent:   Plan Discussed with:   Anesthesia Plan Comments:         Anesthesia Quick Evaluation

## 2020-02-20 NOTE — Anesthesia Procedure Notes (Signed)
Procedure Name: Intubation Date/Time: 02/20/2020 1:01 PM Performed by: West Pugh, CRNA Pre-anesthesia Checklist: Patient identified, Emergency Drugs available, Suction available, Patient being monitored and Timeout performed Patient Re-evaluated:Patient Re-evaluated prior to induction Oxygen Delivery Method: Circle system utilized Preoxygenation: Pre-oxygenation with 100% oxygen Induction Type: IV induction Ventilation: Two handed mask ventilation required and Mask ventilation without difficulty Laryngoscope Size: Mac and 4 Grade View: Grade I Tube type: Oral Tube size: 7.5 mm Number of attempts: 1 Airway Equipment and Method: Stylet Placement Confirmation: ETT inserted through vocal cords under direct vision,  positive ETCO2,  CO2 detector and breath sounds checked- equal and bilateral Secured at: 22 cm Tube secured with: Tape Dental Injury: Teeth and Oropharynx as per pre-operative assessment

## 2020-02-20 NOTE — Transfer of Care (Signed)
Immediate Anesthesia Transfer of Care Note  Patient: Victor Burns  Procedure(s) Performed: ANORECTAL EXAM UNDER ANESTHESIA (N/A Rectum) PARTIAL  FISTULOTOMY WITH PLACEMENT OF CUTTING TETON (N/A Rectum)  Patient Location: PACU  Anesthesia Type:General  Level of Consciousness: awake, alert , oriented and patient cooperative  Airway & Oxygen Therapy: Patient Spontanous Breathing and Patient connected to face mask oxygen  Post-op Assessment: Report given to RN and Post -op Vital signs reviewed and stable  Post vital signs: Reviewed and stable  Last Vitals:  Vitals Value Taken Time  BP 162/91 02/20/20 1349  Temp    Pulse 67 02/20/20 1351  Resp 15 02/20/20 1351  SpO2 100 % 02/20/20 1351  Vitals shown include unvalidated device data.  Last Pain:  Vitals:   02/20/20 1201  TempSrc: Oral  PainSc: 0-No pain         Complications: No apparent anesthesia complications

## 2020-02-20 NOTE — Op Note (Signed)
02/20/2020  1:35 PM  PATIENT:  Victor Burns  29 y.o. male  Patient Care Team: Patient, No Pcp Per as PCP - General (General Practice) Patient, No Pcp Per (General Practice)  PRE-OPERATIVE DIAGNOSIS:  Anal fistula, history of perianal abscesses  POST-OPERATIVE DIAGNOSIS:  Same  PROCEDURE:   1. Partial fistulotomy 2. Placement of cutting seton 3. Anorectal exam under anesthesia  SURGEON:  Surgeon(s): Andria Meuse, MD  ANESTHESIA:   local and general  SPECIMEN:  No Specimen  DISPOSITION OF SPECIMEN:  N/A  COUNTS:  Sponge, needle, and instrument counts were reported correct x2 at conclusion.  EBL: 5 mL  Drains: None  PLAN OF CARE: Discharge to home after PACU  PATIENT DISPOSITION:  PACU - hemodynamically stable.  INDICATION: [name] is a 28yoM with hx of recurrent perianal abscesses has undergone prior incision and drainages of multiple abscesses in that location.  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  This involved a bundle of external sphincter muscle and therefore a draining seton was placed.  He recovered well.  He was told he would need additional surgery to address this.  He had not scheduled these despite them being offered and submitted.  He returned to the office for follow-up on perianal discomfort related to an indwelling seton.  He requested the seton be removed.  We discussed the risks of recurrent abscesses.  He was interested in pursuing subsequent surgery to hopefully definitively address his fistula.  We had reviewed options going forward and he opted to pursue surgery.  Please refer to notes elsewhere for details regarding this discussion.  OR FINDINGS: Fistula with some purulent drainage in the left anterior lateral position.  This was distal to the dentate line and appeared to be fairly low.  The  internal opening emanated from the intersphincteric groove.  This did appear to involve a small band of external muscle but did not appear to be a classic transsphincteric fistula.  External opening in the perianal skin and left anterior lateral position.  Because of the internal opening being at the intersphincteric groove, a "ligation of the intersphincteric tract" procedure did not appear to be a viable option.  Additionally, there was significant scar tissue related to his multiple prior abscesses in this location and the surrounding anoderm was thickened and fibrotic and did not appear to be amenable to who would end up being an anocutaneous flap.  Therefore, opted to pursue placement of cutting seton as opposed to a full primary fistulotomy.  DESCRIPTION: The patient was identified in the preoperative holding area and taken to the OR where SCDs were placed and he underwent general anesthesia without difficulty.  He was then rolled into the prone jackknife position on the operating table.  Pressure points were then evaluated and padded.  The buttocks were gently taped apart.  He was then prepped and draped in usual sterile fashion.  A surgical timeout was performed indicating the correct patient, procedure, positioning and need for preoperative antibiotics. A perianal block was performed using a dilute mixture of Exparel with Marcaine.  A well lubricated digital rectal exam was performed which demonstrated no palpable abnormalities.  A Hill-Ferguson anoscope was into the anal canal and circumferential inspection demonstrated normal-appearing anoderm with mixed internal and external hemorrhoids.  The internal opening of the fistula was in the left anterolateral position.  There was granulation tissue consistent with external opening in the left anterior lateral perianal skin.  A fistula probe was easily passed connecting these 2 openings.  The internal opening emanated from the intersphincteric groove and  was well below/distal to the dentate line.  There was significant fibrotic tissue between the internal/external opening from all of his prior procedures and abscesses at this location.  Given these constellation of findings, it was not deemed to be a candidate for a "LIFT" procedure and with the degree of fibrotic skin and tissue all around this area, not a good candidate for an endorectal/anocutaneous flap either.  Therefore the decision was made to proceed with a cutting seton as opposed to a full primary fistulotomy this go around.  The anoderm was opened between the internal/external openings.  There was a small bundle of external sphincter muscle involved in this tract.  There was no involvement of the internal sphincter.  The partial fistulotomy was carried out to shorten the distance between the external and internal openings.  No muscle was divided.  A snug but not "ischemic" cutting seton was then created using a blue vessel loop secured to itself with 0 silk ligatures.  The perianal area was then inspected and hemostasis verified.  Additional local anesthetic was infiltrated.  Dibucaine ointment was applied.  A dressing consisting of 4 x 4's, ABD, and mesh underwear was placed.  The buttocks were then taped, he was rolled back into the supine position on a stretcher, awakened from anesthesia, extubated, and transferred to PACU in satisfactory condition.  DISPOSITION: PACU in satisfactory condition.

## 2020-03-27 ENCOUNTER — Other Ambulatory Visit: Payer: Self-pay

## 2020-03-27 ENCOUNTER — Ambulatory Visit (HOSPITAL_COMMUNITY)
Admission: EM | Admit: 2020-03-27 | Discharge: 2020-03-27 | Disposition: A | Discharge status: Home / Self Care | Payer: 59 | Attending: Family Medicine | Admitting: Family Medicine

## 2020-03-27 ENCOUNTER — Encounter (HOSPITAL_COMMUNITY): Payer: Self-pay

## 2020-03-27 DIAGNOSIS — R0789 Other chest pain: Secondary | ICD-10-CM | POA: Diagnosis not present

## 2020-03-27 MED ORDER — HYDROXYZINE HCL 25 MG PO TABS: 25.0000 mg | ORAL_TABLET | Freq: Four times a day (QID) | ORAL | 0 refills | Status: DC | PRN

## 2020-03-27 MED ORDER — ALBUTEROL SULFATE HFA 108 (90 BASE) MCG/ACT IN AERS: 1.0000 | INHALATION_SPRAY | Freq: Four times a day (QID) | RESPIRATORY_TRACT | 0 refills | Status: DC | PRN

## 2020-03-27 MED ORDER — NAPROXEN 500 MG PO TABS: 500.0000 mg | ORAL_TABLET | Freq: Two times a day (BID) | ORAL | 0 refills | Status: DC

## 2020-03-27 NOTE — ED Provider Notes (Signed)
[place]    CSN: [id number] Arrival date & time: 03/27/20  1951      History   Chief Complaint Chief Complaint  Patient presents with  . Chest Pain    HPI Victor Burns is a 29 y.o. male history of asthma, presenting today for evaluation of chest pain.  Patient notes that he has had left-sided chest pain for approximately 1 week.  Describes pain as dull aching soreness.  Occasionally pain will migrate.  He denies any injury fall or trauma to this area.  Denies any increase in activity or heavy lifting.  He has felt slight increase in shortness of breath because of the pain, has been using his albuterol inhaler more of recently which does help.  He denies any significant wheezing.  Denies being any recent illness or cough.  Denies fevers.  He denies associated nausea or vomiting.  Denies prior DVT/PE.  Denies leg pain or leg swelling.  Does report tobacco use approximately 3 to 4 cigarettes/day.  He also does admit to cocaine use.  Symptoms began last Thursday, 1 week ago, used cocaine on Friday, but denies worsening of symptoms after use.  Denies personal history of hypertension and diabetes, does admit to family history of hypertension.  Has not used any medicines for symptoms.  Cannot correlate to eating.  He does feel as if his symptoms eased off at nighttime when he is relaxing, but intensified during the day.  Cannot correlate to any position or lying flat.  HPI  Past Medical History:  Diagnosis Date  . Abscess   . Arthritis    knee, back  . Asthma     Patient Active Problem List   Diagnosis Date Noted  . Perirectal abscess s/p I&D 06/16/2018 06/16/2018  . Alcohol abuse 06/16/2018  . Mild persistent asthma 10/07/2017    Past Surgical History:  Procedure Laterality Date  . EVALUATION UNDER ANESTHESIA WITH HEMORRHOIDECTOMY N/A 08/08/2019   Procedure: EXAM UNDER ANESTHESIA WITH IRRIGATION AND DRAINAGE OF PERIRECTAL ABSCESS;  Surgeon: Ileana Roup, MD;   Location: [facility];  Service: General;  Laterality: N/A;  . FISTULOTOMY N/A 02/20/2020   Procedure: PARTIAL  FISTULOTOMY WITH PLACEMENT OF CUTTING TETON;  Surgeon: Ileana Roup, MD;  Location: [facility];  Service: General;  Laterality: N/A;  . INCISION AND DRAINAGE PERIRECTAL ABSCESS N/A 06/16/2018   Procedure: Jasmine December UNDER ANESTHESIA IRRIGATION AND DEBRIDEMENT PERIRECTAL ABSCESS;  Surgeon: Rolm Bookbinder, MD;  Location: [facility];  Service: General;  Laterality: N/A;  . KNEE ARTHROSCOPY    . PLACEMENT OF SETON  08/08/2019   Procedure: PLACEMENT OF DRAINING SETON;  Surgeon: Ileana Roup, MD;  Location: [facility];  Service: General;;  . RECTAL EXAM UNDER ANESTHESIA N/A 02/20/2020   Procedure: ANORECTAL EXAM UNDER ANESTHESIA;  Surgeon: Ileana Roup, MD;  Location: [facility];  Service: General;  Laterality: N/A;       Home Medications    Prior to Admission medications   Medication Sig Start Date End Date Taking? Authorizing Provider  acetaminophen (TYLENOL) 500 MG tablet Take 2 tablets (1,000 mg total) by mouth every 6 (six) hours as needed (for pain.). 02/20/20   Ileana Roup, MD  albuterol (VENTOLIN HFA) 108 (90 Base) MCG/ACT inhaler Inhale 1-2 puffs into the lungs every 6 (six) hours as needed for wheezing or shortness of breath. 03/27/20   Dheeraj Hail C, PA-C  hydrOXYzine (ATARAX/VISTARIL) 25 MG tablet Take 1 tablet (25 mg total) by mouth every 6 (six)  hours as needed for anxiety. 03/27/20   Chellie Vanlue C, PA-C  naproxen (NAPROSYN) 500 MG tablet Take 1 tablet (500 mg total) by mouth 2 (two) times daily. 03/27/20   Cayleen Benjamin C, PA-C  vitamin C (ASCORBIC ACID) 500 MG tablet Take 1,000 mg by mouth daily.    [provider]    Family History No family history on file.  Social History Social History   Tobacco Use  . Smoking status: Current Every Day Smoker    Packs/day: 0.25    Years: 10.00    Pack years: 2.50    Types: Cigarettes  . Smokeless  tobacco: Never Used  Substance Use Topics  . Alcohol use: Yes    Alcohol/week: 10.0 standard drinks    Types: 10 Cans of beer per week    Comment: 2 bottles liquor/ per wk  . Drug use: Yes    Types: Marijuana, Cocaine, MDMA (Ecstacy)    Comment: last use 02/16/20     Allergies   Bee venom, Shellfish allergy, and Other   Review of Systems Review of Systems  Constitutional: Negative for activity change, appetite change, chills, fatigue and fever.  HENT: Negative for congestion, ear pain, rhinorrhea, sinus pressure, sore throat and trouble swallowing.   Eyes: Negative for discharge and redness.  Respiratory: Positive for shortness of breath. Negative for cough and chest tightness.   Cardiovascular: Positive for chest pain.  Gastrointestinal: Negative for abdominal pain, diarrhea, nausea and vomiting.  Musculoskeletal: Negative for myalgias.  Skin: Negative for rash.  Neurological: Negative for dizziness, light-headedness and headaches.     Physical Exam Triage Vital Signs ED Triage Vitals  Enc Vitals Group     BP 03/27/20 1956 128/74     Pulse Rate 03/27/20 1956 64     Resp 03/27/20 1956 16     Temp 03/27/20 1956 98.6 F (37 C)     Temp Source 03/27/20 1956 Oral     SpO2 03/27/20 1956 100 %     Weight 03/27/20 1958 220 lb (99.8 kg)     Height 03/27/20 1958 5\' 11"  (1.803 m)     Head Circumference --      Peak Flow --      Pain Score 03/27/20 1958 6     Pain Loc --      Pain Edu? --      Excl. in GC? --    No data found.  Updated Vital Signs BP 128/74   Pulse 64   Temp 98.6 F (37 C) (Oral)   Resp 16   Ht 5\' 11"  (1.803 m)   Wt 220 lb (99.8 kg)   SpO2 100%   BMI 30.68 kg/m   Visual Acuity Right Eye Distance:   Left Eye Distance:   Bilateral Distance:    Right Eye Near:   Left Eye Near:    Bilateral Near:     Physical Exam Vitals and nursing note reviewed.  Constitutional:      Appearance: He is well-developed.     Comments: No acute distress    HENT:     Head: Normocephalic and atraumatic.     Nose: Nose normal.     Mouth/Throat:     Comments: Oral mucosa pink and moist, no tonsillar enlargement or exudate. Posterior pharynx patent and nonerythematous, no uvula deviation or swelling. Normal phonation. Eyes:     Extraocular Movements: Extraocular movements intact.     Conjunctiva/sclera: Conjunctivae normal.     Pupils: Pupils are  equal, round, and reactive to light.  Cardiovascular:     Rate and Rhythm: Normal rate and regular rhythm.  Pulmonary:     Effort: Pulmonary effort is normal. No respiratory distress.     Comments: Breathing comfortably at rest, CTABL, no wheezing, rales or other adventitious sounds auscultated Abdominal:     General: There is no distension.  Musculoskeletal:        General: Normal range of motion.     Cervical back: Neck supple.     Comments: Left anterior chest with some mildly reproducible tenderness to palpation just above the nipple line  Skin:    General: Skin is warm and dry.  Neurological:     Mental Status: He is alert and oriented to person, place, and time.      UC Treatments / Results  Labs (all labs ordered are listed, but only abnormal results are displayed) Labs Reviewed - No data to display  EKG   Radiology No results found.  Procedures Procedures (including critical care time)  Medications Ordered in UC Medications - No data to display  Initial Impression / Assessment and Plan / UC Course  I have reviewed the triage vital signs and the nursing notes.  Pertinent labs & imaging results that were available during my care of the patient were reviewed by me and considered in my medical decision making (see chart for details).     EKG normal sinus rhythm, does have early repolarization most notable in lead V2 and V3, no acute signs of ischemia or infarction.  Patient low risk for ACS, symptoms reproducible, possible chest wall inflammation.  Seems less likely GERD.   Possible anxiety component given symptoms ease off at nighttime.  Lungs clear, PERC negative, do not suspect underlying pulmonary etiology at this time.  No tachycardia, O2 100%.  Recommending treatment with anti-inflammatories consistently x1 week, providing hydroxyzine to use as needed for anxiety with close monitoring of symptoms.  Advised if developing more symptoms associated with breathing or difficulty breathing to follow-up for reevaluation.  Discussed strict return precautions. Patient verbalized understanding and is agreeable with plan.  Final Clinical Impressions(s) / UC Diagnoses   Final diagnoses:  Atypical chest pain     Discharge Instructions     EKG normal Please use Naprosyn twice daily for the next week to help with any underlying muscle/chest wall inflammation contributing to pain May try using hydroxyzine as needed for anxiety-may cause drowsiness, do not drive or work after taking I have refilled your albuterol inhaler  Please continue to monitor your chest discomfort, if symptoms changing, worsening, developing increase shortness of breath or difficulty breathing, dizziness or lightheadedness please follow-up here or emergency room   ED Prescriptions    Medication Sig Dispense Auth. Provider   naproxen (NAPROSYN) 500 MG tablet Take 1 tablet (500 mg total) by mouth 2 (two) times daily. 30 tablet Geana Walts C, PA-C   albuterol (VENTOLIN HFA) 108 (90 Base) MCG/ACT inhaler Inhale 1-2 puffs into the lungs every 6 (six) hours as needed for wheezing or shortness of breath. 18 g Camila Norville C, PA-C   hydrOXYzine (ATARAX/VISTARIL) 25 MG tablet Take 1 tablet (25 mg total) by mouth every 6 (six) hours as needed for anxiety. 16 tablet Taneisha Fuson, [place] C, PA-C     PDMP not reviewed this encounter.   Lew Dawes, [place] 03/27/20 2039

## 2020-03-27 NOTE — ED Triage Notes (Signed)
Pt c/o 6/10 non radiating sharp left sided chest painx1 wk. Pt states has SOB off and on. Pt has non labored breathing. Skin color WNL.

## 2020-03-27 NOTE — Discharge Instructions (Signed)
EKG normal Please use Naprosyn twice daily for the next week to help with any underlying muscle/chest wall inflammation contributing to pain May try using hydroxyzine as needed for anxiety-may cause drowsiness, do not drive or work after taking I have refilled your albuterol inhaler  Please continue to monitor your chest discomfort, if symptoms changing, worsening, developing increase shortness of breath or difficulty breathing, dizziness or lightheadedness please follow-up here or emergency room

## 2020-04-12 ENCOUNTER — Other Ambulatory Visit: Payer: Self-pay

## 2020-04-12 ENCOUNTER — Emergency Department (HOSPITAL_COMMUNITY): Payer: 59

## 2020-04-12 ENCOUNTER — Emergency Department (HOSPITAL_COMMUNITY)
Admission: EM | Admit: 2020-04-12 | Discharge: 2020-04-12 | Disposition: A | Discharge status: Home / Self Care | Payer: 59 | Attending: Emergency Medicine | Admitting: Emergency Medicine

## 2020-04-12 ENCOUNTER — Encounter (HOSPITAL_COMMUNITY): Payer: Self-pay | Admitting: Emergency Medicine

## 2020-04-12 DIAGNOSIS — R0602 Shortness of breath: Secondary | ICD-10-CM | POA: Insufficient documentation

## 2020-04-12 DIAGNOSIS — R0789 Other chest pain: Secondary | ICD-10-CM | POA: Insufficient documentation

## 2020-04-12 DIAGNOSIS — F17210 Nicotine dependence, cigarettes, uncomplicated: Secondary | ICD-10-CM | POA: Diagnosis not present

## 2020-04-12 DIAGNOSIS — J4530 Mild persistent asthma, uncomplicated: Secondary | ICD-10-CM | POA: Diagnosis not present

## 2020-04-12 LAB — CBC
HCT: 39.2 % (ref 39.0–52.0)
Hemoglobin: 13 g/dL (ref 13.0–17.0)
MCH: 30.2 pg (ref 26.0–34.0)
MCHC: 33.2 g/dL (ref 30.0–36.0)
MCV: 91.2 fL (ref 80.0–100.0)
Platelets: 298 10*3/uL (ref 150–400)
RBC: 4.3 MIL/uL (ref 4.22–5.81)
RDW: 12.7 % (ref 11.5–15.5)
WBC: 5.3 10*3/uL (ref 4.0–10.5)
nRBC: 0 % (ref 0.0–0.2)

## 2020-04-12 LAB — BASIC METABOLIC PANEL
Anion gap: 10 (ref 5–15)
BUN: 19 mg/dL (ref 6–20)
CO2: 25 mmol/L (ref 22–32)
Calcium: 9.3 mg/dL (ref 8.9–10.3)
Chloride: 102 mmol/L (ref 98–111)
Creatinine, Ser: 1.21 mg/dL (ref 0.61–1.24)
GFR calc Af Amer: >60 mL/min (ref >60)
GFR calc non Af Amer: >60 mL/min (ref >60)
Glucose, Bld: 104 mg/dL — ABNORMAL HIGH (ref 70–99)
Potassium: 3.8 mmol/L (ref 3.5–5.1)
Sodium: 137 mmol/L (ref 135–145)

## 2020-04-12 MED ORDER — ALUM & MAG HYDROXIDE-SIMETH 200-200-20 MG/5ML PO SUSP
30.0000 mL | Freq: Once | ORAL | Status: AC
  Administered 2020-04-12: 06:00:00 30 mL via ORAL
  Filled 2020-04-12: qty 30

## 2020-04-12 MED ORDER — SODIUM CHLORIDE 0.9% FLUSH: 3.0000 mL | Freq: Once | INTRAVENOUS | Status: DC

## 2020-04-12 MED ORDER — LIDOCAINE VISCOUS HCL 2 % MT SOLN
15.0000 mL | Freq: Once | OROMUCOSAL | Status: AC
  Administered 2020-04-12: 15 mL via ORAL
  Filled 2020-04-12: qty 15

## 2020-04-12 NOTE — ED Triage Notes (Addendum)
Patient presents for CP that he was seen previously for. EKG was completed and WNL. Patient states that [facility] prescribed him anxiety medications and naproxen for his symptoms which he hasn't taken. Patient states once he got his insurance handled, he didn't want to take the meds and came in to be seen instead.

## 2020-04-12 NOTE — ED Provider Notes (Signed)
[facility]-EMERGENCY DEPT Provider Note  CSN: [id number] Arrival date & time: 04/12/20 0240  Chief Complaint(s) Chest Pain, Shortness of Breath, and Flank Pain  HPI Victor Burns is a 29 y.o. male with a history of asthma who presents to the emergency department with 2 weeks of intermittent left-sided chest wall, right shoulder and right occipital pain.  Episodes last approximately 10 to 15 minutes and are sporadic.  Described as sharp stabbing pains.  They resolve on their own.  They are not relieved with albuterol use.  They are associated with shortness of breath related to the pain.  He denies any recent fevers or infections.  No coughing or congestion.  No nausea or vomiting.  No prior history of PE or DVTs.  No prolonged immobilization.  Patient also endorses unrelated left upper quadrant discomfort.  This is also intermittent.  Patient is currently asymptomatic.  No other physical complaints.  HPI  Past Medical History Past Medical History:  Diagnosis Date  . Abscess   . Arthritis    knee, back  . Asthma    Patient Active Problem List   Diagnosis Date Noted  . Perirectal abscess s/p I&D 06/16/2018 06/16/2018  . Alcohol abuse 06/16/2018  . Mild persistent asthma 10/07/2017   Home Medication(s) Prior to Admission medications   Medication Sig Start Date End Date Taking? Authorizing Provider  acetaminophen (TYLENOL) 500 MG tablet Take 2 tablets (1,000 mg total) by mouth every 6 (six) hours as needed (for pain.). 02/20/20  Yes Andria Meuse, MD  albuterol (VENTOLIN HFA) 108 (90 Base) MCG/ACT inhaler Inhale 1-2 puffs into the lungs every 6 (six) hours as needed for wheezing or shortness of breath. 03/27/20  Yes Wieters, Hallie C, PA-C  vitamin C (ASCORBIC ACID) 500 MG tablet Take 1,000 mg by mouth daily.   Yes [provider]  hydrOXYzine (ATARAX/VISTARIL) 25 MG tablet Take 1 tablet (25 mg total) by mouth every 6 (six) hours as needed for  anxiety. Patient not taking: Reported on 04/12/2020 03/27/20   Wieters, Hallie C, PA-C  naproxen (NAPROSYN) 500 MG tablet Take 1 tablet (500 mg total) by mouth 2 (two) times daily. Patient not taking: Reported on 04/12/2020 03/27/20   Lew Dawes, PA-C                                                                                                                                    Past Surgical History Past Surgical History:  Procedure Laterality Date  . EVALUATION UNDER ANESTHESIA WITH HEMORRHOIDECTOMY N/A 08/08/2019   Procedure: EXAM UNDER ANESTHESIA WITH IRRIGATION AND DRAINAGE OF PERIRECTAL ABSCESS;  Surgeon: Andria Meuse, MD;  Location: [facility];  Service: General;  Laterality: N/A;  . FISTULOTOMY N/A 02/20/2020   Procedure: PARTIAL  FISTULOTOMY WITH PLACEMENT OF CUTTING TETON;  Surgeon: Andria Meuse, MD;  Location: [facility];  Service: General;  Laterality: N/A;  . INCISION AND  DRAINAGE PERIRECTAL ABSCESS N/A 06/16/2018   Procedure: EXAM UNDER ANESTHESIA IRRIGATION AND DEBRIDEMENT PERIRECTAL ABSCESS;  Surgeon: Emelia Loron, MD;  Location: [facility];  Service: General;  Laterality: N/A;  . KNEE ARTHROSCOPY    . PLACEMENT OF SETON  08/08/2019   Procedure: PLACEMENT OF DRAINING SETON;  Surgeon: Andria Meuse, MD;  Location: [facility];  Service: General;;  . RECTAL EXAM UNDER ANESTHESIA N/A 02/20/2020   Procedure: ANORECTAL EXAM UNDER ANESTHESIA;  Surgeon: Andria Meuse, MD;  Location: [facility];  Service: General;  Laterality: N/A;   Family History No family history on file.  Social History Social History   Tobacco Use  . Smoking status: Current Every Day Smoker    Packs/day: 0.25    Years: 10.00    Pack years: 2.50    Types: Cigarettes  . Smokeless tobacco: Never Used  Substance Use Topics  . Alcohol use: Yes    Alcohol/week: 10.0 standard drinks    Types: 10 Cans of beer per week    Comment: 2 bottles liquor/ per wk  . Drug use: Yes    Types: Marijuana,  Cocaine, MDMA (Ecstacy)    Comment: last use 02/16/20   Allergies Bee venom, Shellfish allergy, and Other  Review of Systems Review of Systems All other systems are reviewed and are negative for acute change except as noted in the HPI  Physical Exam Vital Signs  I have reviewed the triage vital signs BP 138/89   Pulse (!) 53   Temp 98.1 F (36.7 C) (Oral)   Resp 14   Ht 5\' 11"  (1.803 m)   Wt 99.8 kg   SpO2 98%   BMI 30.68 kg/m   Physical Exam Vitals reviewed.  Constitutional:      General: He is not in acute distress.    Appearance: He is well-developed. He is not diaphoretic.  HENT:     Head: Normocephalic and atraumatic.     Nose: Nose normal.  Eyes:     General: No scleral icterus.       Right eye: No discharge.        Left eye: No discharge.     Conjunctiva/sclera: Conjunctivae normal.     Pupils: Pupils are equal, round, and reactive to light.  Cardiovascular:     Rate and Rhythm: Normal rate and regular rhythm.     Heart sounds: No murmur. No friction rub. No gallop.   Pulmonary:     Effort: Pulmonary effort is normal. No respiratory distress.     Breath sounds: Normal breath sounds. No stridor. No rales.  Abdominal:     General: There is no distension.     Palpations: Abdomen is soft.     Tenderness: There is no abdominal tenderness.  Musculoskeletal:        General: No tenderness.     Cervical back: Normal range of motion and neck supple.  Skin:    General: Skin is warm and dry.     Findings: No erythema or rash.  Neurological:     Mental Status: He is alert and oriented to person, place, and time.     ED Results and Treatments Labs (all labs ordered are listed, but only abnormal results are displayed) Labs Reviewed  BASIC METABOLIC PANEL - Abnormal; Notable for the following components:      Result Value   Glucose, Bld 104 (*)    All other components within normal limits  CBC  EKG  EKG Interpretation  Date/Time:  Saturday Apr 12 2020 03:25:38 EDT Ventricular Rate:  53 PR Interval:    QRS Duration: 94 QT Interval:  416 QTC Calculation: 391 R Axis:   67 Text Interpretation: Sinus rhythm Inferior infarct, acute (LCx) ST elevation, consider anterior injury Lateral leads are also involved 12 Lead; Mason-Likar No significant change since last tracing Confirmed by Addison Lank [phone]) on 04/12/2020 3:32:45 AM      Radiology DG Chest 2 View  Result Date: 04/12/2020 CLINICAL DATA:  Initial evaluation for acute chest pain, shortness of breath. EXAM: CHEST - 2 VIEW COMPARISON:  Prior radiograph from 11/30/2018. FINDINGS: The cardiac and mediastinal silhouettes are stable in size and contour, and remain within normal limits. The lungs are normally inflated. No airspace consolidation, pleural effusion, or pulmonary edema. No pneumothorax. No acute osseous abnormality. IMPRESSION: No active cardiopulmonary disease. Electronically Signed   By: Jeannine Boga M.D.   On: 04/12/2020 04:30    Pertinent labs & imaging results that were available during my care of the patient were reviewed by me and considered in my medical decision making (see chart for details).  Medications Ordered in ED Medications  sodium chloride flush (NS) 0.9 % injection 3 mL (3 mLs Intravenous Not Given 04/12/20 0351)  alum & mag hydroxide-simeth (MAALOX/MYLANTA) 200-200-20 MG/5ML suspension 30 mL (30 mLs Oral Given 04/12/20 0536)    And  lidocaine (XYLOCAINE) 2 % viscous mouth solution 15 mL (15 mLs Oral Given 04/12/20 0536)                                                                                                                                    Procedures Procedures  (including critical care time)  Medical Decision Making / ED Course I have reviewed the nursing notes for this encounter and the patient's prior records (if available in EHR  or on provided paperwork).   Victor Burns was evaluated in Emergency Department on 04/12/2020 for the symptoms described in the history of present illness. He was evaluated in the context of the global COVID-19 pandemic, which necessitated consideration that the patient might be at risk for infection with the SARS-CoV-2 virus that causes COVID-19. Institutional protocols and algorithms that pertain to the evaluation of patients at risk for COVID-19 are in a state of rapid change based on information released by regulatory bodies including the CDC and federal and state organizations. These policies and algorithms were followed during the patient's care in the ED.  Highly atypical chest pain.  Inconsistent with ACS.  EKG without acute ischemic changes or evidence of pericarditis.  No need for cardiac markers at this time.  Presentation not classic for aortic dissection or esophageal perforation. Chest x-ray without evidence suggestive of pneumonia, pneumothorax, pneumomediastinum.  No abnormal contour of the mediastinum to suggest dissection. No evidence of acute injuries.  Likely chest wall/muscular spasms vs GI etiology.  Final Clinical Impression(s) / ED Diagnoses Final diagnoses:  Chest wall pain   The patient appears reasonably screened and/or stabilized for discharge and I doubt any other medical condition or other [facility] requiring further screening, evaluation, or treatment in the ED at this time prior to discharge. Safe for discharge with strict return precautions.  Disposition: Discharge  Condition: Good  I have discussed the results, Dx and Tx plan with the patient/family who expressed understanding and agree(s) with the plan. Discharge instructions discussed at length. The patient/family was given strict return precautions who verbalized understanding of the instructions. No further questions at time of discharge.    ED Discharge Orders    None        Follow Up: Primary  care provider  Schedule an appointment as soon as possible for a visit  As needed      This chart was dictated using voice recognition software.  Despite best efforts to proofread,  errors can occur which can change the documentation meaning.   Nira Conn, MD 04/12/20 [phone]

## 2020-09-29 ENCOUNTER — Encounter (HOSPITAL_COMMUNITY): Payer: Self-pay | Admitting: Emergency Medicine

## 2020-09-29 ENCOUNTER — Emergency Department (HOSPITAL_COMMUNITY): Admission: EM | Admit: 2020-09-29 | Discharge: 2020-09-29 | Discharge status: Against Medical Advice | Payer: 59

## 2020-09-29 ENCOUNTER — Other Ambulatory Visit: Payer: Self-pay

## 2020-09-29 ENCOUNTER — Emergency Department (HOSPITAL_COMMUNITY): Payer: Self-pay

## 2020-09-29 ENCOUNTER — Emergency Department (HOSPITAL_COMMUNITY)
Admission: EM | Admit: 2020-09-29 | Discharge: 2020-09-30 | Disposition: A | Discharge status: Home / Self Care | Payer: Self-pay | Attending: Emergency Medicine | Admitting: Emergency Medicine

## 2020-09-29 DIAGNOSIS — F17210 Nicotine dependence, cigarettes, uncomplicated: Secondary | ICD-10-CM | POA: Insufficient documentation

## 2020-09-29 DIAGNOSIS — M546 Pain in thoracic spine: Secondary | ICD-10-CM | POA: Insufficient documentation

## 2020-09-29 DIAGNOSIS — R001 Bradycardia, unspecified: Secondary | ICD-10-CM | POA: Insufficient documentation

## 2020-09-29 DIAGNOSIS — J4530 Mild persistent asthma, uncomplicated: Secondary | ICD-10-CM | POA: Insufficient documentation

## 2020-09-29 DIAGNOSIS — R0789 Other chest pain: Secondary | ICD-10-CM | POA: Insufficient documentation

## 2020-09-29 DIAGNOSIS — R0602 Shortness of breath: Secondary | ICD-10-CM | POA: Insufficient documentation

## 2020-09-29 LAB — BASIC METABOLIC PANEL
Anion gap: 9 (ref 5–15)
BUN: 20 mg/dL (ref 6–20)
CO2: 22 mmol/L (ref 22–32)
Calcium: 9.3 mg/dL (ref 8.9–10.3)
Chloride: 105 mmol/L (ref 98–111)
Creatinine, Ser: 1.05 mg/dL (ref 0.61–1.24)
GFR, Estimated: >60 mL/min (ref >60)
Glucose, Bld: 88 mg/dL (ref 70–99)
Potassium: 3.9 mmol/L (ref 3.5–5.1)
Sodium: 136 mmol/L (ref 135–145)

## 2020-09-29 LAB — CBC
HCT: 37.9 % — ABNORMAL LOW (ref 39.0–52.0)
Hemoglobin: 12.5 g/dL — ABNORMAL LOW (ref 13.0–17.0)
MCH: 29.8 pg (ref 26.0–34.0)
MCHC: 33 g/dL (ref 30.0–36.0)
MCV: 90.2 fL (ref 80.0–100.0)
Platelets: 318 10*3/uL (ref 150–400)
RBC: 4.2 MIL/uL — ABNORMAL LOW (ref 4.22–5.81)
RDW: 12.9 % (ref 11.5–15.5)
WBC: 5.8 10*3/uL (ref 4.0–10.5)
nRBC: 0 % (ref 0.0–0.2)

## 2020-09-29 LAB — TROPONIN I (HIGH SENSITIVITY): Troponin I (High Sensitivity): 4 ng/L (ref <18)

## 2020-09-29 MED ORDER — METHOCARBAMOL 500 MG PO TABS
1000.0000 mg | ORAL_TABLET | Freq: Once | ORAL | Status: AC
  Administered 2020-09-29: 1000 mg via ORAL
  Filled 2020-09-29: qty 2

## 2020-09-29 MED ORDER — IBUPROFEN 200 MG PO TABS
600.0000 mg | ORAL_TABLET | Freq: Once | ORAL | Status: AC
  Administered 2020-09-29: 600 mg via ORAL
  Filled 2020-09-29: qty 3

## 2020-09-29 NOTE — ED Notes (Signed)
Pt did not want to wait to be seen

## 2020-09-29 NOTE — ED Triage Notes (Signed)
Patient is complaining of left chest pain that feels like sharp stick pain. Patient states this started Wednesday and got worse.

## 2020-09-30 LAB — TROPONIN I (HIGH SENSITIVITY): Troponin I (High Sensitivity): 4 ng/L (ref <18)

## 2020-09-30 LAB — D-DIMER, QUANTITATIVE: D-Dimer, Quant: <0.27 ug/mL-FEU (ref 0.00–0.50)

## 2020-09-30 MED ORDER — IBUPROFEN 600 MG PO TABS: 600.0000 mg | ORAL_TABLET | Freq: Four times a day (QID) | ORAL | 0 refills | Status: DC | PRN

## 2020-09-30 MED ORDER — METHOCARBAMOL 500 MG PO TABS: ORAL_TABLET | ORAL | 0 refills | Status: DC

## 2020-09-30 NOTE — Discharge Instructions (Addendum)
Use ice and heat to the painful areas as needed.  Take the medications as prescribed.  Recheck if you get fever, cough, or struggle to breathe.  Otherwise be reassured your test tonight were all good.  Please call [facility] to be evaluated for your rectal bleeding.

## 2020-09-30 NOTE — ED Provider Notes (Signed)
[facility]-EMERGENCY DEPT Provider Note   CSN: [id number] Arrival date & time: 09/29/20  2131   Time seen 11:31 PM  History Chief Complaint  Patient presents with  . Chest Pain  . Shortness of Breath    Victor Burns is a 29 y.o. male.  HPI   Patient states he has had left upper lateral chest pain that has been there constantly since Wednesday, October 20.  He states there is a constant achy stabbing pain in the knee will get a sharp pain.  He also has a pain in his left upper back in the same area behind it.  He states sometimes it hurts more if he takes a big deep breath and it goes away if he sleeps.  He states when he wakes up he feels better however as he gets going for the day it returns.  He has shortness of breath off and on that started today this morning although he did have a little bit yesterday.  He gets the shortness of breath at rest and with exertion.  He denies cough, fever, pain or swelling of his legs.  He denies any recent traveling.  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  When asked about family history of clots he states his sister has heavy vaginal bleeding with clots but no DVT or PE.  He states he has had this pain before but this time it is different and that the pain feels deeper inside his chest and it is more painful.  He denies nausea, vomiting, diaphoresis.  He has had chills twice today.  PCP Patient, No Pcp Per   Past Medical History:  Diagnosis Date  . Abscess   . Arthritis    knee, back  . Asthma     Patient Active Problem List   Diagnosis Date Noted  . Perirectal abscess s/p I&D 06/16/2018 06/16/2018  . Alcohol abuse 06/16/2018  . Mild persistent asthma 10/07/2017    Past Surgical History:  Procedure Laterality Date  . EVALUATION UNDER ANESTHESIA WITH HEMORRHOIDECTOMY N/A 08/08/2019   Procedure: EXAM UNDER ANESTHESIA WITH IRRIGATION AND DRAINAGE OF PERIRECTAL ABSCESS;  Surgeon: Andria Meuse, MD;   Location: [facility];  Service: General;  Laterality: N/A;  . FISTULOTOMY N/A 02/20/2020   Procedure: PARTIAL  FISTULOTOMY WITH PLACEMENT OF CUTTING TETON;  Surgeon: Andria Meuse, MD;  Location: [facility];  Service: General;  Laterality: N/A;  . INCISION AND DRAINAGE PERIRECTAL ABSCESS N/A 06/16/2018   Procedure: Francia Greaves UNDER ANESTHESIA IRRIGATION AND DEBRIDEMENT PERIRECTAL ABSCESS;  Surgeon: Emelia Loron, MD;  Location: [facility];  Service: General;  Laterality: N/A;  . KNEE ARTHROSCOPY    . PLACEMENT OF SETON  08/08/2019   Procedure: PLACEMENT OF DRAINING SETON;  Surgeon: Andria Meuse, MD;  Location: [facility];  Service: General;;  . RECTAL EXAM UNDER ANESTHESIA N/A 02/20/2020   Procedure: ANORECTAL EXAM UNDER ANESTHESIA;  Surgeon: Andria Meuse, MD;  Location: [facility];  Service: General;  Laterality: N/A;       History reviewed. No pertinent family history.  Social History   Tobacco Use  . Smoking status: Current Every Day Smoker    Packs/day: 0.25    Years: 10.00    Pack years: 2.50    Types: Cigarettes  . Smokeless tobacco: Never Used  Vaping Use  . Vaping Use: Never used  Substance Use Topics  . Alcohol use: Yes    Alcohol/week: 10.0 standard drinks  Types: 10 Cans of beer per week    Comment: 2 bottles liquor/ per wk  . Drug use: Yes    Types: Marijuana, Cocaine, MDMA (Ecstacy)    Comment: last use 02/16/20  Patient states during the week he smokes 3 to 4 cigarettes a day however on the weekend he will smoke 2 packs a day because he drinks alcohol. Patient states he drinks 1 to 2 gallons of alcohol over the weekend. 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  Home Medications Prior to Admission medications   Medication Sig Start Date End Date Taking? Authorizing Provider  acetaminophen (TYLENOL) 500 MG tablet Take 2 tablets (1,000 mg total) by mouth every 6 (six) hours as needed (for pain.). 02/20/20  Yes Andria Meuse, MD  albuterol (VENTOLIN  HFA) 108 (90 Base) MCG/ACT inhaler Inhale 1-2 puffs into the lungs every 6 (six) hours as needed for wheezing or shortness of breath. Patient not taking: Reported on 09/29/2020 03/27/20   Wieters, Fran Lowes C, PA-C  hydrOXYzine (ATARAX/VISTARIL) 25 MG tablet Take 1 tablet (25 mg total) by mouth every 6 (six) hours as needed for anxiety. Patient not taking: Reported on 04/12/2020 03/27/20   Wieters, Hallie C, PA-C  ibuprofen (ADVIL) 600 MG tablet Take 1 tablet (600 mg total) by mouth every 6 (six) hours as needed for mild pain or moderate pain. 09/30/20   Devoria Albe, MD  methocarbamol (ROBAXIN) 500 MG tablet Take 1 or 2 po Q 6hrs for muscle pain 09/30/20   Devoria Albe, MD  naproxen (NAPROSYN) 500 MG tablet Take 1 tablet (500 mg total) by mouth 2 (two) times daily. Patient not taking: Reported on 04/12/2020 03/27/20   Lew Dawes, PA-C  Denies meds.  Allergies    Bee venom, Shellfish allergy, and Other  Review of Systems   Review of Systems  All other systems reviewed and are negative.   Physical Exam Updated Vital Signs BP 137/75   Pulse (!) 56   Temp 97.6 F (36.4 C) (Oral)   Resp 12   Ht 6' (1.829 m)   Wt 113.4 kg   SpO2 96%   BMI 33.91 kg/m   Physical Exam Vitals and nursing note reviewed.  Constitutional:      Appearance: Normal appearance. He is obese. He is not toxic-appearing.  HENT:     Head: Normocephalic and atraumatic.     Right Ear: External ear normal.     Left Ear: External ear normal.     Mouth/Throat:     Mouth: Mucous membranes are moist.     Pharynx: No oropharyngeal exudate or posterior oropharyngeal erythema.  Eyes:     Extraocular Movements: Extraocular movements intact.     Conjunctiva/sclera: Conjunctivae normal.     Pupils: Pupils are equal, round, and reactive to light.  Cardiovascular:     Rate and Rhythm: Regular rhythm. Bradycardia present.     Pulses: Normal pulses.     Heart sounds: Normal heart sounds.  Pulmonary:     Effort: Pulmonary  effort is normal. No respiratory distress.     Breath sounds: Normal breath sounds. No stridor. No wheezing, rhonchi or rales.  Chest:     Chest wall: No tenderness.       Comments: Area of chest pain noted Musculoskeletal:        General: Normal range of motion.     Cervical back: Normal range of motion.       Back:     Comments: Area  of back pain noted  Skin:    General: Skin is warm and dry.     Findings: No rash.  Neurological:     General: No focal deficit present.     Mental Status: He is alert and oriented to person, place, and time.     Cranial Nerves: No cranial nerve deficit.  Psychiatric:        Mood and Affect: Mood normal.        Behavior: Behavior normal.        Thought Content: Thought content normal.     ED Results / Procedures / Treatments   Labs (all labs ordered are listed, but only abnormal results are displayed) Results for orders placed or performed during the hospital encounter of 09/29/20  Basic metabolic panel  Result Value Ref Range   Sodium 136 135 - 145 mmol/L   Potassium 3.9 3.5 - 5.1 mmol/L   Chloride 105 98 - 111 mmol/L   CO2 22 22 - 32 mmol/L   Glucose, Bld 88 70 - 99 mg/dL   BUN 20 6 - 20 mg/dL   Creatinine, Ser 1.611.05 0.61 - 1.24 mg/dL   Calcium 9.3 8.9 - 09.610.3 mg/dL   GFR, Estimated >04>60 >54>60 mL/min   Anion gap 9 5 - 15  CBC  Result Value Ref Range   WBC 5.8 4.0 - 10.5 K/uL   RBC 4.20 (L) 4.22 - 5.81 MIL/uL   Hemoglobin 12.5 (L) 13.0 - 17.0 g/dL   HCT 09.837.9 (L) 39 - 52 %   MCV 90.2 80.0 - 100.0 fL   MCH 29.8 26.0 - 34.0 pg   MCHC 33.0 30.0 - 36.0 g/dL   RDW 11.912.9 14.711.5 - 82.915.5 %   Platelets 318 150 - 400 K/uL   nRBC 0.0 0.0 - 0.2 %  D-dimer, quantitative  Result Value Ref Range   D-Dimer, Quant <0.27 0.00 - 0.50 ug/mL-FEU  Troponin I (High Sensitivity)  Result Value Ref Range   Troponin I (High Sensitivity) 4 <18 ng/L  Troponin I (High Sensitivity)  Result Value Ref Range   Troponin I (High Sensitivity) 4 <18 ng/L   Laboratory  interpretation all normal except mild anemia which he has had in the past.    EKG EKG Interpretation  Date/Time:  Monday September 29 2020 21:48:04 EDT Ventricular Rate:  54 PR Interval:    QRS Duration: 95 QT Interval:  420 QTC Calculation: 398 R Axis:   62 Text Interpretation: Sinus rhythm ST changes similar to prior. No STEMI Confirmed by Alona BeneLong, Joshua [phone]([id number]) on 09/29/2020 9:51:02 PM   Radiology DG Chest 2 View  Result Date: 09/29/2020 CLINICAL DATA:  Upper left chest pain x3 days. EXAM: CHEST - 2 VIEW COMPARISON:  Apr 12, 2020 FINDINGS: The heart size and mediastinal contours are within normal limits. Both lungs are clear. The visualized skeletal structures are unremarkable. IMPRESSION: No active cardiopulmonary disease. Electronically Signed   By: Aram Candelahaddeus  Houston M.D.   On: 09/29/2020 23:13    Procedures Procedures (including critical care time)  Medications Ordered in ED Medications  ibuprofen (ADVIL) tablet 600 mg (600 mg Oral Given 09/29/20 2356)  methocarbamol (ROBAXIN) tablet 1,000 mg (1,000 mg Oral Given 09/29/20 2356)    ED Course  I have reviewed the triage vital signs and the nursing notes.  Pertinent labs & imaging results that were available during my care of the patient were reviewed by me and considered in my medical decision making (see chart for details).  MDM Rules/Calculators/A&P                          I added a D-dimer to his blood work.  Patient has a mild anemia.  He states he has had some rectal bleeding off and on but he has had abscesses in the rectal area.  Patient was given ibuprofen and Robaxin for his chest pain although it was not painful to palpation I thought this would help his pain.  He has had constant pain he states since Wednesday which is almost a week ago and his initial troponin is normal so he did not having cardiac pain.  During my exam patient's pulse ox was 100% on room air and his heart rate was 52.  Recheck at 1:15 AM  patient states his pain is improving.  When I listen to him he has excellent air movement.  He states he feels like he can breathe better now.  His D-dimer is negative.  Patient was reassured that he had no pneumonia, heart attack, blood clot problem.  He was discharged home.  He was referred to [facility] for further evaluation of his complaints of rectal bleeding intermittently.  Final Clinical Impression(s) / ED Diagnoses Final diagnoses:  Atypical chest pain    Rx / DC Orders ED Discharge Orders         Ordered    methocarbamol (ROBAXIN) 500 MG tablet        09/30/20 0121    ibuprofen (ADVIL) 600 MG tablet  Every 6 hours PRN        09/30/20 0121         Plan discharge  Devoria Albe, MD, Concha Pyo, MD 09/30/20 [phone]

## 2021-03-04 ENCOUNTER — Emergency Department (HOSPITAL_COMMUNITY)
Admission: EM | Admit: 2021-03-04 | Discharge: 2021-03-04 | Disposition: A | Discharge status: Home / Self Care | Payer: Self-pay | Attending: Emergency Medicine | Admitting: Emergency Medicine

## 2021-03-04 ENCOUNTER — Emergency Department (HOSPITAL_COMMUNITY): Payer: Self-pay

## 2021-03-04 ENCOUNTER — Encounter (HOSPITAL_COMMUNITY): Payer: Self-pay

## 2021-03-04 DIAGNOSIS — R0602 Shortness of breath: Secondary | ICD-10-CM | POA: Insufficient documentation

## 2021-03-04 DIAGNOSIS — J4530 Mild persistent asthma, uncomplicated: Secondary | ICD-10-CM | POA: Insufficient documentation

## 2021-03-04 DIAGNOSIS — Z20822 Contact with and (suspected) exposure to covid-19: Secondary | ICD-10-CM | POA: Insufficient documentation

## 2021-03-04 DIAGNOSIS — R0789 Other chest pain: Secondary | ICD-10-CM | POA: Insufficient documentation

## 2021-03-04 DIAGNOSIS — F17210 Nicotine dependence, cigarettes, uncomplicated: Secondary | ICD-10-CM | POA: Insufficient documentation

## 2021-03-04 DIAGNOSIS — J45909 Unspecified asthma, uncomplicated: Secondary | ICD-10-CM | POA: Insufficient documentation

## 2021-03-04 DIAGNOSIS — R531 Weakness: Secondary | ICD-10-CM | POA: Insufficient documentation

## 2021-03-04 DIAGNOSIS — R079 Chest pain, unspecified: Secondary | ICD-10-CM | POA: Insufficient documentation

## 2021-03-04 LAB — TROPONIN I (HIGH SENSITIVITY): Troponin I (High Sensitivity): 8 ng/L (ref <18)

## 2021-03-04 LAB — COMPREHENSIVE METABOLIC PANEL
ALT: 25 U/L (ref 0–44)
AST: 23 U/L (ref 15–41)
Albumin: 4.2 g/dL (ref 3.5–5.0)
Alkaline Phosphatase: 56 U/L (ref 38–126)
Anion gap: 9 (ref 5–15)
BUN: 11 mg/dL (ref 6–20)
CO2: 26 mmol/L (ref 22–32)
Calcium: 8.9 mg/dL (ref 8.9–10.3)
Chloride: 103 mmol/L (ref 98–111)
Creatinine, Ser: 1.04 mg/dL (ref 0.61–1.24)
GFR, Estimated: >60 mL/min (ref >60)
Glucose, Bld: 110 mg/dL — ABNORMAL HIGH (ref 70–99)
Potassium: 3.1 mmol/L — ABNORMAL LOW (ref 3.5–5.1)
Sodium: 138 mmol/L (ref 135–145)
Total Bilirubin: 0.6 mg/dL (ref 0.3–1.2)
Total Protein: 7.6 g/dL (ref 6.5–8.1)

## 2021-03-04 LAB — CBC WITH DIFFERENTIAL/PLATELET
Abs Immature Granulocytes: 0.01 10*3/uL (ref 0.00–0.07)
Basophils Absolute: 0 10*3/uL (ref 0.0–0.1)
Basophils Relative: 0 %
Eosinophils Absolute: 0 10*3/uL (ref 0.0–0.5)
Eosinophils Relative: 0 %
HCT: 37.5 % — ABNORMAL LOW (ref 39.0–52.0)
Hemoglobin: 12.1 g/dL — ABNORMAL LOW (ref 13.0–17.0)
Immature Granulocytes: 0 %
Lymphocytes Relative: 39 %
Lymphs Abs: 2 10*3/uL (ref 0.7–4.0)
MCH: 29.4 pg (ref 26.0–34.0)
MCHC: 32.3 g/dL (ref 30.0–36.0)
MCV: 91.2 fL (ref 80.0–100.0)
Monocytes Absolute: 0.3 10*3/uL (ref 0.1–1.0)
Monocytes Relative: 6 %
Neutro Abs: 2.8 10*3/uL (ref 1.7–7.7)
Neutrophils Relative %: 55 %
Platelets: 348 10*3/uL (ref 150–400)
RBC: 4.11 MIL/uL — ABNORMAL LOW (ref 4.22–5.81)
RDW: 13 % (ref 11.5–15.5)
WBC: 5.2 10*3/uL (ref 4.0–10.5)
nRBC: 0 % (ref 0.0–0.2)

## 2021-03-04 LAB — ETHANOL: Alcohol, Ethyl (B): <10 mg/dL (ref <10)

## 2021-03-04 LAB — D-DIMER, QUANTITATIVE: D-Dimer, Quant: 0.28 ug/mL-FEU (ref 0.00–0.50)

## 2021-03-04 LAB — SEDIMENTATION RATE: Sed Rate: 15 mm/hr (ref 0–16)

## 2021-03-04 MED ORDER — ALBUTEROL SULFATE HFA 108 (90 BASE) MCG/ACT IN AERS
1.0000 | INHALATION_SPRAY | Freq: Four times a day (QID) | RESPIRATORY_TRACT | 0 refills | Status: DC | PRN
Start: 2021-03-04 — End: 2021-10-18

## 2021-03-04 MED ORDER — KETOROLAC TROMETHAMINE 30 MG/ML IJ SOLN
15.0000 mg | Freq: Once | INTRAMUSCULAR | Status: AC
  Administered 2021-03-04: 15 mg via INTRAVENOUS
  Filled 2021-03-04: qty 1

## 2021-03-04 MED ORDER — PREDNISONE 10 MG (21) PO TBPK: ORAL_TABLET | Freq: Every day | ORAL | 0 refills | Status: DC

## 2021-03-04 MED ORDER — IPRATROPIUM-ALBUTEROL 0.5-2.5 (3) MG/3ML IN SOLN
3.0000 mL | Freq: Once | RESPIRATORY_TRACT | Status: AC
  Administered 2021-03-04: 3 mL via RESPIRATORY_TRACT
  Filled 2021-03-04: qty 3

## 2021-03-04 NOTE — ED Notes (Signed)
Dr.Lockwood at bedside  

## 2021-03-04 NOTE — ED Provider Notes (Signed)
[facility]-EMERGENCY DEPT Provider Note   CSN: [id number] Arrival date & time: 03/04/21  1311     History Chief Complaint  Patient presents with  . Chest Pain    Victor Burns is a 30 y.o. male.  HPI Patient presents with concern of chest pain. He was here within the past 24 hours for similar concern.  He notes that at that point he also had shortness of breath.  The dyspnea has improved after he received breathing treatment.  However, upon arriving home his chest pain became more severe, still focal, left-sided, nonradiating, heavy.  No relief with anything.  Waxing, waning severity, including substantial exacerbation while in the waiting room. He notes that pain is been there for about 1 year, he has not yet established care with a primary care physician or had outpatient follow-up. He is a smoker.    Past Medical History:  Diagnosis Date  . Abscess   . Arthritis    knee, back  . Asthma     Patient Active Problem List   Diagnosis Date Noted  . Perirectal abscess s/p I&D 06/16/2018 06/16/2018  . Alcohol abuse 06/16/2018  . Mild persistent asthma 10/07/2017    Past Surgical History:  Procedure Laterality Date  . EVALUATION UNDER ANESTHESIA WITH HEMORRHOIDECTOMY N/A 08/08/2019   Procedure: EXAM UNDER ANESTHESIA WITH IRRIGATION AND DRAINAGE OF PERIRECTAL ABSCESS;  Surgeon: Andria Meuse, MD;  Location: [facility];  Service: General;  Laterality: N/A;  . FISTULOTOMY N/A 02/20/2020   Procedure: PARTIAL  FISTULOTOMY WITH PLACEMENT OF CUTTING TETON;  Surgeon: Andria Meuse, MD;  Location: [facility];  Service: General;  Laterality: N/A;  . INCISION AND DRAINAGE PERIRECTAL ABSCESS N/A 06/16/2018   Procedure: Francia Greaves UNDER ANESTHESIA IRRIGATION AND DEBRIDEMENT PERIRECTAL ABSCESS;  Surgeon: Emelia Loron, MD;  Location: [facility];  Service: General;  Laterality: N/A;  . KNEE ARTHROSCOPY    . PLACEMENT OF SETON  08/08/2019   Procedure: PLACEMENT OF DRAINING  SETON;  Surgeon: Andria Meuse, MD;  Location: [facility];  Service: General;;  . RECTAL EXAM UNDER ANESTHESIA N/A 02/20/2020   Procedure: ANORECTAL EXAM UNDER ANESTHESIA;  Surgeon: Andria Meuse, MD;  Location: [facility];  Service: General;  Laterality: N/A;       History reviewed. No pertinent family history.  Social History   Tobacco Use  . Smoking status: Current Every Day Smoker    Packs/day: 0.25    Years: 10.00    Pack years: 2.50    Types: Cigarettes  . Smokeless tobacco: Never Used  Vaping Use  . Vaping Use: Never used  Substance Use Topics  . Alcohol use: Yes    Alcohol/week: 10.0 standard drinks    Types: 10 Cans of beer per week    Comment: 2 bottles liquor/ per wk  . Drug use: Yes    Types: Marijuana, Cocaine, MDMA (Ecstacy)    Comment: last use 02/16/20    Home Medications Prior to Admission medications   Medication Sig Start Date End Date Taking? Authorizing Provider  acetaminophen (TYLENOL) 500 MG tablet Take 2 tablets (1,000 mg total) by mouth every 6 (six) hours as needed (for pain.). 02/20/20   Andria Meuse, MD  albuterol (VENTOLIN HFA) 108 (90 Base) MCG/ACT inhaler Inhale 1-2 puffs into the lungs every 6 (six) hours as needed for wheezing or shortness of breath. 03/04/21   Sabas Sous, MD  hydrOXYzine (ATARAX/VISTARIL) 25 MG tablet Take 1 tablet (25 mg total) by mouth  every 6 (six) hours as needed for anxiety. Patient not taking: Reported on 04/12/2020 03/27/20   Wieters, Hallie C, PA-C  ibuprofen (ADVIL) 600 MG tablet Take 1 tablet (600 mg total) by mouth every 6 (six) hours as needed for mild pain or moderate pain. 09/30/20   Devoria Albe, MD  methocarbamol (ROBAXIN) 500 MG tablet Take 1 or 2 po Q 6hrs for muscle pain 09/30/20   Devoria Albe, MD  naproxen (NAPROSYN) 500 MG tablet Take 1 tablet (500 mg total) by mouth 2 (two) times daily. Patient not taking: Reported on 04/12/2020 03/27/20   Wieters, Fran Lowes C, PA-C    Allergies    Bee venom,  Shellfish allergy, and Other  Review of Systems   Review of Systems  Constitutional:       Per HPI, otherwise negative  HENT:       Per HPI, otherwise negative  Respiratory:       Per HPI, otherwise negative  Cardiovascular:       Per HPI, otherwise negative  Gastrointestinal: Negative for vomiting.  Endocrine:       Negative aside from HPI  Genitourinary:       Neg aside from HPI   Musculoskeletal:       Per HPI, otherwise negative  Skin: Negative.   Neurological: Negative for syncope.    Physical Exam Updated Vital Signs BP (!) 146/99 (BP Location: Right Arm)   Pulse (!) 53   Temp 98.8 F (37.1 C) (Oral)   Resp 14   Ht 5\' 11"  (1.803 m)   Wt 113.4 kg   SpO2 100%   BMI 34.87 kg/m   Physical Exam Vitals and nursing note reviewed.  Constitutional:      General: He is not in acute distress.    Appearance: He is well-developed.  HENT:     Head: Normocephalic and atraumatic.  Eyes:     Conjunctiva/sclera: Conjunctivae normal.  Cardiovascular:     Rate and Rhythm: Normal rate and regular rhythm.  Pulmonary:     Effort: Pulmonary effort is normal. No respiratory distress.     Breath sounds: No stridor.  Abdominal:     General: There is no distension.  Skin:    General: Skin is warm and dry.  Neurological:     Mental Status: He is alert and oriented to person, place, and time.      ED Results / Procedures / Treatments   Labs (all labs ordered are listed, but only abnormal results are displayed) Labs Reviewed  ETHANOL  COMPREHENSIVE METABOLIC PANEL  CBC WITH DIFFERENTIAL/PLATELET  D-DIMER, QUANTITATIVE  SEDIMENTATION RATE  TROPONIN I (HIGH SENSITIVITY)    EKG EKG Interpretation  Date/Time:  Wednesday March 04 2021 13:27:24 EDT Ventricular Rate:  52 PR Interval:  169 QRS Duration: 95 QT Interval:  434 QTC Calculation: 404 R Axis:   65 Text Interpretation: Sinus rhythm Early repolarization pattern Abnormal ECG Confirmed by 04-02-1994 [phone])  on 03/04/2021 4:28:21 PM   Radiology DG Chest 2 View  Result Date: 03/04/2021 CLINICAL DATA:  Asthma.  Shortness of breath. EXAM: CHEST - 2 VIEW COMPARISON:  09/29/2020. FINDINGS: Mediastinum and hilar structures normal. Heart size normal. No focal infiltrate. No pleural effusion or pneumothorax. No acute bony abnormality. IMPRESSION: No acute cardiopulmonary disease.  Exam stable from prior exam. Electronically Signed   By: 10/01/2020  Register   On: 03/04/2021 05:53    Procedures Procedures   Medications Ordered in ED Medications  ketorolac (TORADOL) 30 MG/ML  injection 15 mg (has no administration in time range)    ED Course  I have reviewed the triage vital signs and the nursing notes.  Pertinent labs & imaging results that were available during my care of the patient were reviewed by me and considered in my medical decision making (see chart for details).    8:19 PM Patient awake, alert, no hypoxia, unremarkable labs, vital signs, x-ray from earlier today reviewed, no no pneumonia.  EKG, nonischemic, troponin normal.  Some suspicion for inflammatory condition, absent evidence for bacteremia, sepsis, ACS, with the patient's acknowledgment of pain going back the past year or so, he and I discussed importance of following up as an outpatient, initiation of new medication regimen.  Patient discharged in stable condition. MDM Rules/Calculators/A&P MDM Number of Diagnoses or Management Options Atypical chest pain: established, worsening   Amount and/or Complexity of Data Reviewed Clinical lab tests: ordered and reviewed Tests in the radiology section of CPT: reviewed and ordered Tests in the medicine section of CPT: reviewed and ordered Decide to obtain previous medical records or to obtain history from someone other than the patient: yes Review and summarize past medical records: yes Independent visualization of images, tracings, or specimens: yes  Risk of Complications, Morbidity,  and/or Mortality Presenting problems: high Diagnostic procedures: high Management options: high  Critical Care Total time providing critical care: < 30 minutes  Patient Progress Patient progress: stable  Final Clinical Impression(s) / ED Diagnoses Final diagnoses:  Atypical chest pain    Rx / DC Orders ED Discharge Orders         Ordered    predniSONE (STERAPRED UNI-PAK 21 TAB) 10 MG (21) TBPK tablet  Daily        03/04/21 2017           Gerhard Munch, MD 03/04/21 2020

## 2021-03-04 NOTE — ED Triage Notes (Signed)
Pt arrived via POV, states con stabbing chest pain that started early this morning. Was seen this morning for same, cleared and d/c, states pain worsening once he got home.

## 2021-03-04 NOTE — Discharge Instructions (Addendum)
You were evaluated in the Emergency Department and after careful evaluation, we did not find any emergent condition requiring admission or further testing in the hospital.  Your exam/testing today was overall reassuring.  Please return to the Emergency Department if you experience any worsening of your condition.  Thank you for allowing us to be a part of your care.  

## 2021-03-04 NOTE — ED Triage Notes (Signed)
Pt presents from home, reports waking up around 3a and feeling weak/SOB. Hx asthma and states this feels similar but he is not usually as weak. Used inhaler x 4 PTA without significant relief. Denies cough, fevers

## 2021-03-04 NOTE — ED Provider Notes (Signed)
[facility]-EMERGENCY DEPT [facility] Emergency Department Provider Note MRN:  [id number]  Arrival date & time: 03/04/21     Chief Complaint   Shortness of Breath   History of Present Illness   Victor Burns is a 30 y.o. year-old male with a history of asthma presenting to the ED with chief complaint of shortness of breath.  Woke up with shortness of breath and total body weakness.  The total body weakness has happened a few times in the past.  Also endorsing chest pain which has been present for 1 year.  Denies fever or chills or cold-like symptoms, no abdominal pain, no other complaints.  Review of Systems  A complete 10 system review of systems was obtained and all systems are negative except as noted in the HPI and PMH.   Patient's Health History    Past Medical History:  Diagnosis Date  . Abscess   . Arthritis    knee, back  . Asthma     Past Surgical History:  Procedure Laterality Date  . EVALUATION UNDER ANESTHESIA WITH HEMORRHOIDECTOMY N/A 08/08/2019   Procedure: EXAM UNDER ANESTHESIA WITH IRRIGATION AND DRAINAGE OF PERIRECTAL ABSCESS;  Surgeon: Andria Meuse, MD;  Location: [facility];  Service: General;  Laterality: N/A;  . FISTULOTOMY N/A 02/20/2020   Procedure: PARTIAL  FISTULOTOMY WITH PLACEMENT OF CUTTING TETON;  Surgeon: Andria Meuse, MD;  Location: [facility];  Service: General;  Laterality: N/A;  . INCISION AND DRAINAGE PERIRECTAL ABSCESS N/A 06/16/2018   Procedure: Francia Greaves UNDER ANESTHESIA IRRIGATION AND DEBRIDEMENT PERIRECTAL ABSCESS;  Surgeon: Emelia Loron, MD;  [facility]: [facility];  Service: General;  Laterality: N/A;  . KNEE ARTHROSCOPY    . PLACEMENT OF SETON  08/08/2019   Procedure: PLACEMENT OF DRAINING SETON;  Surgeon: Andria Meuse, MD;  [facility]: [facility];  Service: General;;  . RECTAL EXAM UNDER ANESTHESIA N/A 02/20/2020   Procedure: ANORECTAL EXAM UNDER ANESTHESIA;  Surgeon: Andria Meuse, MD;  [facility]: [facility];  Service: General;   Laterality: N/A;    History reviewed. No pertinent family history.  Social History   Socioeconomic History  . Marital status: Single    Spouse name: Not on file  . Number of children: Not on file  . Years of education: Not on file  . Highest education level: Not on file  Occupational History  . Not on file  Tobacco Use  . Smoking status: Current Every Day Smoker    Packs/day: 0.25    Years: 10.00    Pack years: 2.50    Types: Cigarettes  . Smokeless tobacco: Never Used  Vaping Use  . Vaping Use: Never used  Substance and Sexual Activity  . Alcohol use: Yes    Alcohol/week: 10.0 standard drinks    Types: 10 Cans of beer per week    Comment: 2 bottles liquor/ per wk  . Drug use: Yes    Types: Marijuana, Cocaine, MDMA (Ecstacy)    Comment: last use 02/16/20  . Sexual activity: Not on file  Other Topics Concern  . Not on file  Social History Narrative   ** Merged History Encounter **       Social Determinants of Health   Financial Resource Strain: Not on file  Food Insecurity: Not on file  Transportation Needs: Not on file  Physical Activity: Not on file  Stress: Not on file  Social Connections: Not on file  Intimate Partner Violence: Not on file     Physical Exam  Vitals:   03/04/21 0458  BP: 137/87  Pulse: (!) 58  Resp: 18  Temp: 98.1 F (36.7 C)  SpO2: 98%    CONSTITUTIONAL: Well-appearing, NAD NEURO:  Alert and oriented x 3, no focal deficits EYES:  eyes equal and reactive ENT/NECK:  no LAD, no JVD CARDIO: Regular rate, well-perfused, normal S1 and S2 PULM:  CTAB no wheezing or rhonchi GI/GU:  normal bowel sounds, non-distended, non-tender MSK/SPINE:  No gross deformities, no edema SKIN:  no rash, atraumatic PSYCH:  Appropriate speech and behavior  *Additional and/or pertinent findings included in MDM below  Diagnostic and Interventional Summary    EKG Interpretation  Date/Time:  Wednesday March 04 2021 06:01:38 EDT Ventricular Rate:   58 PR Interval:  173 QRS Duration: 99 QT Interval:  435 QTC Calculation: 428 R Axis:   60 Text Interpretation: Sinus rhythm stelevation No significant change was found Confirmed by Kennis Carina [phone]) on 03/04/2021 6:07:48 AM      Labs Reviewed - No data to display  DG Chest 2 View  Final Result      Medications  ipratropium-albuterol (DUONEB) 0.5-2.5 (3) MG/3ML nebulizer solution 3 mL (has no administration in time range)     Procedures  /  Critical Care Procedures  ED Course and Medical Decision Making  I have reviewed the triage vital signs, the nursing notes, and pertinent available records from the EMR.  Listed above are laboratory and imaging tests that I personally ordered, reviewed, and interpreted and then considered in my medical decision making (see below for details).  Short of breath and feels weak but no focal neurological deficits, no increased work of breathing, lungs are clear without wheezing, no evidence of DVT on exam, normal vital signs, highly doubt PE or emergent process.  Suspect component of asthma and/or anxiety, will obtain screening EKG and chest x-ray and after that will be appropriate for discharge if reassuring.       Elmer Sow. Pilar Plate, MD [facility] Emergency Medicine [facility] [email or web address]  Final Clinical Impressions(s) / ED Diagnoses     ICD-10-CM   1. SOB (shortness of breath)  R06.02 DG Chest 2 View    DG Chest 2 View    ED Discharge Orders    None       Discharge Instructions Discussed with and Provided to Patient:     Discharge Instructions     You were evaluated in the Emergency Department and after careful evaluation, we did not find any emergent condition requiring admission or further testing in the hospital.  Your exam/testing today was overall reassuring.  Please return to the Emergency Department if you experience any worsening of your condition.  Thank you for allowing [place] to be a part  of your care.        Sabas Sous, MD 03/04/21 [phone]

## 2021-03-04 NOTE — Discharge Instructions (Addendum)
As discussed, today's evaluation has been generally reassuring.  There is no evidence for new pneumonia, new heart attack, new bronchitis.  You have been prescribed a new medication.  Please take it as provided. A Covid test has been sent.  These results should be available overnight.  Return here for concerning changes in your condition otherwise, as discussed, it is important you follow-up with an outpatient primary care provider for appropriate ongoing long-term management.

## 2021-03-05 LAB — SARS CORONAVIRUS 2 (TAT 6-24 HRS): SARS Coronavirus 2: NEGATIVE

## 2021-04-07 ENCOUNTER — Other Ambulatory Visit: Payer: Self-pay

## 2021-04-07 ENCOUNTER — Emergency Department (HOSPITAL_COMMUNITY): Payer: BC Managed Care – PPO

## 2021-04-07 ENCOUNTER — Emergency Department (HOSPITAL_COMMUNITY)
Admission: EM | Admit: 2021-04-07 | Discharge: 2021-04-08 | Disposition: A | Discharge status: Home / Self Care | Payer: BC Managed Care – PPO | Attending: Emergency Medicine | Admitting: Emergency Medicine

## 2021-04-07 ENCOUNTER — Ambulatory Visit (HOSPITAL_COMMUNITY)
Admission: EM | Admit: 2021-04-07 | Discharge: 2021-04-07 | Disposition: A | Discharge status: Home / Self Care | Payer: BC Managed Care – PPO | Attending: Medical Oncology | Admitting: Medical Oncology

## 2021-04-07 ENCOUNTER — Encounter (HOSPITAL_COMMUNITY): Payer: Self-pay | Admitting: Emergency Medicine

## 2021-04-07 DIAGNOSIS — R079 Chest pain, unspecified: Secondary | ICD-10-CM

## 2021-04-07 DIAGNOSIS — R0789 Other chest pain: Secondary | ICD-10-CM | POA: Insufficient documentation

## 2021-04-07 DIAGNOSIS — R9431 Abnormal electrocardiogram [ECG] [EKG]: Secondary | ICD-10-CM

## 2021-04-07 DIAGNOSIS — F17210 Nicotine dependence, cigarettes, uncomplicated: Secondary | ICD-10-CM | POA: Diagnosis not present

## 2021-04-07 DIAGNOSIS — F1490 Cocaine use, unspecified, uncomplicated: Secondary | ICD-10-CM

## 2021-04-07 DIAGNOSIS — R531 Weakness: Secondary | ICD-10-CM

## 2021-04-07 DIAGNOSIS — J4530 Mild persistent asthma, uncomplicated: Secondary | ICD-10-CM | POA: Insufficient documentation

## 2021-04-07 DIAGNOSIS — R55 Syncope and collapse: Secondary | ICD-10-CM | POA: Insufficient documentation

## 2021-04-07 DIAGNOSIS — F1010 Alcohol abuse, uncomplicated: Secondary | ICD-10-CM

## 2021-04-07 LAB — CBC WITH DIFFERENTIAL/PLATELET
Abs Immature Granulocytes: 0.01 10*3/uL (ref 0.00–0.07)
Basophils Absolute: 0 10*3/uL (ref 0.0–0.1)
Basophils Relative: 0 %
Eosinophils Absolute: 0 10*3/uL (ref 0.0–0.5)
Eosinophils Relative: 0 %
HCT: 40.9 % (ref 39.0–52.0)
Hemoglobin: 13.3 g/dL (ref 13.0–17.0)
Immature Granulocytes: 0 %
Lymphocytes Relative: 24 %
Lymphs Abs: 1.3 10*3/uL (ref 0.7–4.0)
MCH: 29.5 pg (ref 26.0–34.0)
MCHC: 32.5 g/dL (ref 30.0–36.0)
MCV: 90.7 fL (ref 80.0–100.0)
Monocytes Absolute: 0.4 10*3/uL (ref 0.1–1.0)
Monocytes Relative: 8 %
Neutro Abs: 3.5 10*3/uL (ref 1.7–7.7)
Neutrophils Relative %: 68 %
Platelets: 357 10*3/uL (ref 150–400)
RBC: 4.51 MIL/uL (ref 4.22–5.81)
RDW: 13.4 % (ref 11.5–15.5)
WBC: 5.3 10*3/uL (ref 4.0–10.5)
nRBC: 0 % (ref 0.0–0.2)

## 2021-04-07 LAB — COMPREHENSIVE METABOLIC PANEL
ALT: 29 U/L (ref 0–44)
AST: 26 U/L (ref 15–41)
Albumin: 4.5 g/dL (ref 3.5–5.0)
Alkaline Phosphatase: 61 U/L (ref 38–126)
Anion gap: 12 (ref 5–15)
BUN: 17 mg/dL (ref 6–20)
CO2: 22 mmol/L (ref 22–32)
Calcium: 9.4 mg/dL (ref 8.9–10.3)
Chloride: 103 mmol/L (ref 98–111)
Creatinine, Ser: 0.99 mg/dL (ref 0.61–1.24)
GFR, Estimated: >60 mL/min (ref >60)
Glucose, Bld: 83 mg/dL (ref 70–99)
Potassium: 3.8 mmol/L (ref 3.5–5.1)
Sodium: 137 mmol/L (ref 135–145)
Total Bilirubin: 0.7 mg/dL (ref 0.3–1.2)
Total Protein: 7.8 g/dL (ref 6.5–8.1)

## 2021-04-07 LAB — TROPONIN I (HIGH SENSITIVITY)
Troponin I (High Sensitivity): 10 ng/L (ref <18)
Troponin I (High Sensitivity): 10 ng/L (ref <18)

## 2021-04-07 LAB — ETHANOL: Alcohol, Ethyl (B): <10 mg/dL (ref <10)

## 2021-04-07 LAB — LIPASE, BLOOD: Lipase: 27 U/L (ref 11–51)

## 2021-04-07 MED ORDER — HYOSCYAMINE SULFATE 0.125 MG SL SUBL
0.2500 mg | SUBLINGUAL_TABLET | Freq: Once | SUBLINGUAL | Status: AC
  Administered 2021-04-07: 0.25 mg via SUBLINGUAL
  Filled 2021-04-07: qty 2

## 2021-04-07 MED ORDER — ALUM & MAG HYDROXIDE-SIMETH 200-200-20 MG/5ML PO SUSP
30.0000 mL | Freq: Once | ORAL | Status: AC
  Administered 2021-04-07: 30 mL via ORAL
  Filled 2021-04-07: qty 30

## 2021-04-07 MED ORDER — LIDOCAINE VISCOUS HCL 2 % MT SOLN
15.0000 mL | Freq: Once | OROMUCOSAL | Status: AC
  Administered 2021-04-07: 15 mL via ORAL
  Filled 2021-04-07: qty 15

## 2021-04-07 NOTE — ED Triage Notes (Signed)
An episode of face feeling numb, then felt overwhelmingly hot, felt like he was going to pass out and chest started hurting.  Patient has had theses episodes before and using ice packs to cool down is the only thing that seems to help.    Patient feels slightly weak, slight sharp chest pain to left chest

## 2021-04-07 NOTE — ED Notes (Addendum)
Pt has told this tech, CP is getting worse. Notified triage RN

## 2021-04-07 NOTE — ED Provider Notes (Signed)
[facility]    CSN: [id number] Arrival date & time: 04/07/21  1454      History   Chief Complaint Chief Complaint  Patient presents with  . Weakness    HPI Victor Burns is a 30 y.o. male.   HPI   * nursing staff alerted to patients CC at 21 minutes post arrival   Weakness: Pt reports that he recently has another episode where he feels his face go numb, he feels hot and flushed and as he would pass out and then had chest pain he has had about 5 episodes since January. In the past he has used ice packs to help cool down and feel better. Today he reports that his episodes started this morning. He reports that although he is feeling mostly back to normal he still feels a bit weak with slight sharp left sided chest pain.  When we discussed further about his symptoms and substance use he states that he drinks half a gallon of rum each day on the weekend including Friday and uses roughly 2 g of cocaine each day of the weekend including Friday.  His similar symptoms have occurred on Tuesdays through Thursdays.    Past Medical History:  Diagnosis Date  . Abscess   . Arthritis    knee, back  . Asthma     Patient Active Problem List   Diagnosis Date Noted  . Perirectal abscess s/p I&D 06/16/2018 06/16/2018  . Alcohol abuse 06/16/2018  . Mild persistent asthma 10/07/2017    Past Surgical History:  Procedure Laterality Date  . EVALUATION UNDER ANESTHESIA WITH HEMORRHOIDECTOMY N/A 08/08/2019   Procedure: EXAM UNDER ANESTHESIA WITH IRRIGATION AND DRAINAGE OF PERIRECTAL ABSCESS;  Surgeon: Andria Meuse, MD;  [facility];  Service: General;  Laterality: N/A;  . FISTULOTOMY N/A 02/20/2020   Procedure: PARTIAL  FISTULOTOMY WITH PLACEMENT OF CUTTING TETON;  Surgeon: Andria Meuse, MD;  [facility];  Service: General;  Laterality: N/A;  . INCISION AND DRAINAGE PERIRECTAL ABSCESS N/A 06/16/2018   Procedure: Francia Greaves UNDER ANESTHESIA IRRIGATION AND DEBRIDEMENT  PERIRECTAL ABSCESS;  Surgeon: Emelia Loron, MD;  [facility];  Service: General;  Laterality: N/A;  . KNEE ARTHROSCOPY    . PLACEMENT OF SETON  08/08/2019   Procedure: PLACEMENT OF DRAINING SETON;  Surgeon: Andria Meuse, MD;  [facility];  Service: General;;  . RECTAL EXAM UNDER ANESTHESIA N/A 02/20/2020   Procedure: ANORECTAL EXAM UNDER ANESTHESIA;  Surgeon: Andria Meuse, MD;  [facility];  Service: General;  Laterality: N/A;       Home Medications    Prior to Admission medications   Medication Sig Start Date End Date Taking? Authorizing Provider  albuterol (VENTOLIN HFA) 108 (90 Base) MCG/ACT inhaler Inhale 1-2 puffs into the lungs every 6 (six) hours as needed for wheezing or shortness of breath. 03/04/21  Yes Sabas Sous, MD  acetaminophen (TYLENOL) 500 MG tablet Take 2 tablets (1,000 mg total) by mouth every 6 (six) hours as needed (for pain.). 02/20/20   Andria Meuse, MD  predniSONE (STERAPRED UNI-PAK 21 TAB) 10 MG (21) TBPK tablet Take by mouth daily. Take 6 tabs by mouth daily  for 2 days, then 5 tabs for 2 days, then 4 tabs for 2 days, then 3 tabs for 2 days, 2 tabs for 2 days, then 1 tab by mouth daily for 2 days Patient not taking: Reported on 04/07/2021 03/04/21   Gerhard Munch, MD  Family History Family History  Problem Relation Age of Onset  . Hypertension Mother   . Hypertension Father     Social History Social History   Tobacco Use  . Smoking status: Current Every Day Smoker    Packs/day: 0.25    Years: 10.00    Pack years: 2.50    Types: Cigarettes  . Smokeless tobacco: Never Used  Vaping Use  . Vaping Use: Never used  Substance Use Topics  . Alcohol use: Yes    Alcohol/week: 10.0 standard drinks    Types: 10 Cans of beer per week    Comment: 2 bottles liquor/ per wk  . Drug use: Yes    Types: Marijuana, Cocaine, MDMA (Ecstacy)    Comment: last use 02/16/20     Allergies   Bee venom, Shellfish allergy,  and Other   Review of Systems Review of Systems  As stated above in HPI Physical Exam Triage Vital Signs ED Triage Vitals  Enc Vitals Group     BP      Pulse      Resp      Temp      Temp src      SpO2      Weight      Height      Head Circumference      Peak Flow      Pain Score      Pain Loc      Pain Edu?      Excl. in GC?    No data found.  Updated Vital Signs BP 138/85 (BP Location: Left Arm) Comment (BP Location): large cuff  Pulse 60   Temp 98.5 F (36.9 C) (Oral)   Resp 18   SpO2 96%   Physical Exam Vitals and nursing note reviewed.  Constitutional:      General: He is not in acute distress.    Appearance: Normal appearance. He is not ill-appearing, toxic-appearing or diaphoretic.  HENT:     Head: Normocephalic and atraumatic.  Eyes:     Extraocular Movements: Extraocular movements intact.     Pupils: Pupils are equal, round, and reactive to light.  Neck:     Vascular: No carotid bruit.  Cardiovascular:     Rate and Rhythm: Regular rhythm. Bradycardia present.     Heart sounds: Normal heart sounds.  Pulmonary:     Effort: Pulmonary effort is normal.     Breath sounds: Normal breath sounds.  Musculoskeletal:     Right lower leg: No edema.     Left lower leg: No edema.  Skin:    General: Skin is warm.  Neurological:     General: No focal deficit present.     Mental Status: He is alert and oriented to person, place, and time.  Psychiatric:        Mood and Affect: Mood normal.        Behavior: Behavior normal.      UC Treatments / Results  Labs (all labs ordered are listed, but only abnormal results are displayed) Labs Reviewed - No data to display  EKG   Radiology No results found.  Procedures Procedures (including critical care time)  Medications Ordered in UC Medications - No data to display  Initial Impression / Assessment and Plan / UC Course  I have reviewed the triage vital signs and the nursing notes.  Pertinent labs  & imaging results that were available during my care of the patient were reviewed by me  and considered in my medical decision making (see chart for details).     New.  His EKG has 2 mild changes and no evidence of a STEMI.  His symptoms have improved but he still does have some 4-5 out of 10 rated chest discomfort.  I am recommending that he be evaluated in the emergency room further as he will need troponins, labs and cardiac monitoring.  We discussed that he will likely need to see a cardiologist given the changes that are on his EKG which appear chronic and match up with his history of cocaine and EtOH abuse.  It is advised that he reduce his use.  Final Clinical Impressions(s) / UC Diagnoses   Final diagnoses:  None   Discharge Instructions   None    ED Prescriptions    None     PDMP not reviewed this encounter.   Rushie Chestnut, [place] 04/07/21 1722

## 2021-04-07 NOTE — ED Triage Notes (Signed)
Pt reports intermittent chest pain x 1 year and states it feel like a sharp stabbing pain and associated symptoms of weakness, facial numbness and light headed. Pt states have to eat a piece of candy and place ice on his back with mild relief of symptoms

## 2021-04-07 NOTE — ED Provider Notes (Signed)
[facility] EMERGENCY DEPARTMENT Provider Note  CSN: [id number] Arrival date & time: 04/07/21 1728  Chief Complaint(s) Chest Pain  HPI Victor Burns is a 30 y.o. male   The history is provided by the patient.  Chest Pain Pain location:  L chest Pain quality: stabbing   Radiates to: around to the left axilla. Pain severity:  Moderate Onset quality:  Sudden Duration: recurring for 1 yr. Today's episode started at 2p. Timing:  Intermittent Progression:  Waxing and waning Chronicity:  Recurrent Relieved by:  Nothing Worsened by:  Nothing Associated symptoms: near-syncope   Associated symptoms: no anxiety, no cough, no fatigue, no fever, no headache, no heartburn, no shortness of breath and no syncope   Risk factors: male sex     Past Medical History Past Medical History:  Diagnosis Date  . Abscess   . Arthritis    knee, back  . Asthma    Patient Active Problem List   Diagnosis Date Noted  . Perirectal abscess s/p I&D 06/16/2018 06/16/2018  . Alcohol abuse 06/16/2018  . Mild persistent asthma 10/07/2017   Home Medication(s) Prior to Admission medications   Medication Sig Start Date End Date Taking? Authorizing Provider  hydrOXYzine (ATARAX/VISTARIL) 25 MG tablet Take 1 tablet (25 mg total) by mouth every 6 (six) hours. 04/08/21  Yes Kaye Luoma, Amadeo Garnet, MD  acetaminophen (TYLENOL) 500 MG tablet Take 2 tablets (1,000 mg total) by mouth every 6 (six) hours as needed (for pain.). 02/20/20   Andria Meuse, MD  albuterol (VENTOLIN HFA) 108 (90 Base) MCG/ACT inhaler Inhale 1-2 puffs into the lungs every 6 (six) hours as needed for wheezing or shortness of breath. 03/04/21   Sabas Sous, MD                                                                                                                                    Past Surgical History Past Surgical History:  Procedure Laterality Date  . EVALUATION UNDER ANESTHESIA WITH HEMORRHOIDECTOMY N/A  08/08/2019   Procedure: EXAM UNDER ANESTHESIA WITH IRRIGATION AND DRAINAGE OF PERIRECTAL ABSCESS;  Surgeon: Andria Meuse, MD;  Location: [facility];  Service: General;  Laterality: N/A;  . FISTULOTOMY N/A 02/20/2020   Procedure: PARTIAL  FISTULOTOMY WITH PLACEMENT OF CUTTING TETON;  Surgeon: Andria Meuse, MD;  Location: [facility];  Service: General;  Laterality: N/A;  . INCISION AND DRAINAGE PERIRECTAL ABSCESS N/A 06/16/2018   Procedure: Francia Greaves UNDER ANESTHESIA IRRIGATION AND DEBRIDEMENT PERIRECTAL ABSCESS;  Surgeon: Emelia Loron, MD;  Location: [facility];  Service: General;  Laterality: N/A;  . KNEE ARTHROSCOPY    . PLACEMENT OF SETON  08/08/2019   Procedure: PLACEMENT OF DRAINING SETON;  Surgeon: Andria Meuse, MD;  Location: [facility];  Service: General;;  . RECTAL EXAM UNDER ANESTHESIA N/A 02/20/2020   Procedure: ANORECTAL EXAM UNDER ANESTHESIA;  Surgeon: Andria Meuse, MD;  Location: [facility];  Service: General;  Laterality: N/A;   Family History Family History  Problem Relation Age of Onset  . Hypertension Mother   . Hypertension Father     Social History Social History   Tobacco Use  . Smoking status: Current Every Day Smoker    Packs/day: 0.25    Years: 10.00    Pack years: 2.50    Types: Cigarettes  . Smokeless tobacco: Never Used  Vaping Use  . Vaping Use: Never used  Substance Use Topics  . Alcohol use: Yes    Alcohol/week: 10.0 standard drinks    Types: 10 Cans of beer per week    Comment: 2 bottles liquor/ per wk  . Drug use: Yes    Types: Marijuana, Cocaine, MDMA (Ecstacy)    Comment: last use 02/16/20   Allergies Bee venom, Shellfish allergy, and Other  Review of Systems Review of Systems  Constitutional: Negative for fatigue and fever.  Respiratory: Negative for cough and shortness of breath.   Cardiovascular: Positive for chest pain and near-syncope. Negative for syncope.  Gastrointestinal: Negative for heartburn.  Neurological: Negative  for headaches.   All other systems are reviewed and are negative for acute change except as noted in the HPI  Physical Exam Vital Signs  I have reviewed the triage vital signs BP (!) 148/83   Pulse (!) 57   Temp 98.2 F (36.8 C) (Oral)   Resp (!) 24   SpO2 95%   Physical Exam Vitals reviewed.  Constitutional:      General: He is not in acute distress.    Appearance: He is well-developed. He is not diaphoretic.  HENT:     Head: Normocephalic and atraumatic.     Nose: Nose normal.  Eyes:     General: No scleral icterus.       Right eye: No discharge.        Left eye: No discharge.     Conjunctiva/sclera: Conjunctivae normal.     Pupils: Pupils are equal, round, and reactive to light.  Cardiovascular:     Rate and Rhythm: Normal rate and regular rhythm.     Heart sounds: No murmur heard. No friction rub. No gallop.   Pulmonary:     Effort: Pulmonary effort is normal. No respiratory distress.     Breath sounds: Normal breath sounds. No stridor. No rales.  Abdominal:     General: There is no distension.     Palpations: Abdomen is soft.     Tenderness: There is no abdominal tenderness.  Musculoskeletal:        General: No tenderness.     Cervical back: Normal range of motion and neck supple.  Skin:    General: Skin is warm and dry.     Findings: No erythema or rash.  Neurological:     Mental Status: He is alert and oriented to person, place, and time.     ED Results and Treatments Labs (all labs ordered are listed, but only abnormal results are displayed) Labs Reviewed  ETHANOL  COMPREHENSIVE METABOLIC PANEL  CBC WITH DIFFERENTIAL/PLATELET  LIPASE, BLOOD  TROPONIN I (HIGH SENSITIVITY)  TROPONIN I (HIGH SENSITIVITY)  EKG  EKG Interpretation  Date/Time:    Ventricular Rate:    PR Interval:    QRS Duration:   QT Interval:    QTC  Calculation:   R Axis:     Text Interpretation:        Radiology DG Chest 2 View  Result Date: 04/07/2021 CLINICAL DATA:  Chest pain EXAM: CHEST - 2 VIEW COMPARISON:  03/04/2021 FINDINGS: The heart size and mediastinal contours are within normal limits. Both lungs are clear. The visualized skeletal structures are unremarkable. IMPRESSION: No active cardiopulmonary disease. Electronically Signed   By: Helyn NumbersAshesh  Parikh MD   On: 04/07/2021 19:30    Pertinent labs & imaging results that were available during my care of the patient were reviewed by me and considered in my medical decision making (see chart for details).  Medications Ordered in ED Medications  alum & mag hydroxide-simeth (MAALOX/MYLANTA) 200-200-20 MG/5ML suspension 30 mL (30 mLs Oral Given 04/07/21 2354)    And  lidocaine (XYLOCAINE) 2 % viscous mouth solution 15 mL (15 mLs Oral Given 04/07/21 2354)  hyoscyamine (LEVSIN SL) SL tablet 0.25 mg (0.25 mg Sublingual Given 04/07/21 2354)                                                                                                                                    Procedures Procedures  (including critical care time)  Medical Decision Making / ED Course I have reviewed the nursing notes for this encounter and the patient's prior records (if available in EHR or on provided paperwork).   Rolena InfanteUrban Aultman was evaluated in Emergency Department on 04/08/2021 for the symptoms described in the history of present illness. He was evaluated in the context of the global COVID-19 pandemic, which necessitated consideration that the patient might be at risk for infection with the SARS-CoV-2 virus that causes COVID-19. Institutional protocols and algorithms that pertain to the evaluation of patients at risk for COVID-19 are in a state of rapid change based on information released by regulatory bodies including the CDC and federal and state organizations. These policies and algorithms were followed during  the patient's care in the ED.  Patient presents with recurrent atypical chest pain. Seen at urgent care and sent here for evaluation due to questionable EKG. EKG without acute ischemic changes or evidence of pericarditis.  Similar to numerous EKGs from the past. Heart score less than 3 and troponins negative x2. Presentation is not classic for aortic dissection or esophageal perforation. Chest x-ray without evidence suggestive of pneumonia, pneumothorax, pneumomediastinum.  No abnormal contour of the mediastinum to suggest dissection. No evidence of acute injuries. Low suspicion and low pretest probability for pulmonary embolism.  Possibly anxiety versus GI etiology. Patient requested medicine for anxiety.  Will Rx Vistaril.      Final Clinical Impression(s) / ED Diagnoses Final diagnoses:  Atypical chest pain    The patient appears reasonably screened and/or stabilized  for discharge and I doubt any other medical condition or other [facility] requiring further screening, evaluation, or treatment in the ED at this time prior to discharge. Safe for discharge with strict return precautions.  Disposition: Discharge  Condition: Good  I have discussed the results, Dx and Tx plan with the patient/family who expressed understanding and agree(s) with the plan. Discharge instructions discussed at length. The patient/family was given strict return precautions who verbalized understanding of the instructions. No further questions at time of discharge.    ED Discharge Orders         Ordered    hydrOXYzine (ATARAX/VISTARIL) 25 MG tablet  Every 6 hours        04/08/21 0051            Follow Up: Primary care provider  Call  to schedule an appointment for close follow up     This chart was dictated using voice recognition software.  Despite best efforts to proofread,  errors can occur which can change the documentation meaning.   Nira Conn, MD 04/08/21 [phone]

## 2021-04-07 NOTE — ED Provider Notes (Signed)
Emergency Medicine Provider Triage Evaluation Note  Victor Burns , a 30 y.o. male  was evaluated in triage.  Pt complains of intermittent chest pains for multiple months with intermittent feelings of weakness and light headed.  He was sent here from urgent care.  He drinks half a gallon of rum each day on the weekend and uses about 2 g of cocaine each day on the weekend including Friday.  Last drink was yesterday.  When he feels weak its body wide and resolves  Review of Systems  Positive: Chest pain Negative: Syncope  Physical Exam  BP (!) 153/97 (BP Location: Left Arm)   Pulse 61   Temp 98.5 F (36.9 C) (Oral)   Resp 15   SpO2 95%  Gen:   Awake, no distress   Resp:  Normal effort  MSK:   Moves extremities without difficulty    Medical Decision Making  Medically screening exam initiated at 6:52 PM.  Appropriate orders placed.  Cabell Lazenby was informed that the remainder of the evaluation will be completed by another provider, this initial triage assessment does not replace that evaluation, and the importance of remaining in the ED until their evaluation is complete.     Cristina Gong, PA-C 04/07/21 1854    Arby Barrette, MD 04/08/21 [phone]

## 2021-04-07 NOTE — Discharge Instructions (Signed)
Please go directly to the emergency room 

## 2021-04-08 MED ORDER — HYDROXYZINE HCL 25 MG PO TABS: 25.0000 mg | ORAL_TABLET | Freq: Four times a day (QID) | ORAL | 0 refills | Status: DC

## 2021-04-08 NOTE — ED Notes (Signed)
Patient verbalizes understanding of discharge instructions. Prescriptions reviewed. Opportunity for questioning and answers were provided. Armband removed by staff, pt discharged from ED ambulatory. ° °

## 2021-04-10 ENCOUNTER — Other Ambulatory Visit: Payer: Self-pay

## 2021-04-10 ENCOUNTER — Emergency Department (HOSPITAL_COMMUNITY): Payer: BC Managed Care – PPO

## 2021-04-10 ENCOUNTER — Emergency Department (HOSPITAL_COMMUNITY)
Admission: EM | Admit: 2021-04-10 | Discharge: 2021-04-11 | Disposition: A | Discharge status: Home / Self Care | Payer: BC Managed Care – PPO | Source: Home / Self Care | Attending: Emergency Medicine | Admitting: Emergency Medicine

## 2021-04-10 ENCOUNTER — Encounter (HOSPITAL_COMMUNITY): Payer: Self-pay

## 2021-04-10 ENCOUNTER — Ambulatory Visit (HOSPITAL_COMMUNITY)
Admission: EM | Admit: 2021-04-10 | Discharge: 2021-04-10 | Disposition: A | Discharge status: Home / Self Care | Payer: BC Managed Care – PPO | Attending: Physician Assistant | Admitting: Physician Assistant

## 2021-04-10 ENCOUNTER — Emergency Department (HOSPITAL_COMMUNITY)
Admission: EM | Admit: 2021-04-10 | Discharge: 2021-04-10 | Disposition: A | Discharge status: Home / Self Care | Payer: BC Managed Care – PPO | Attending: Emergency Medicine | Admitting: Emergency Medicine

## 2021-04-10 ENCOUNTER — Encounter (HOSPITAL_COMMUNITY): Payer: Self-pay | Admitting: Emergency Medicine

## 2021-04-10 DIAGNOSIS — G44009 Cluster headache syndrome, unspecified, not intractable: Secondary | ICD-10-CM | POA: Insufficient documentation

## 2021-04-10 DIAGNOSIS — R519 Headache, unspecified: Secondary | ICD-10-CM

## 2021-04-10 DIAGNOSIS — J45909 Unspecified asthma, uncomplicated: Secondary | ICD-10-CM | POA: Insufficient documentation

## 2021-04-10 DIAGNOSIS — R0789 Other chest pain: Secondary | ICD-10-CM | POA: Diagnosis not present

## 2021-04-10 DIAGNOSIS — R410 Disorientation, unspecified: Secondary | ICD-10-CM | POA: Insufficient documentation

## 2021-04-10 DIAGNOSIS — Z5321 Procedure and treatment not carried out due to patient leaving prior to being seen by health care provider: Secondary | ICD-10-CM | POA: Insufficient documentation

## 2021-04-10 DIAGNOSIS — R079 Chest pain, unspecified: Secondary | ICD-10-CM | POA: Insufficient documentation

## 2021-04-10 DIAGNOSIS — F05 Delirium due to known physiological condition: Secondary | ICD-10-CM | POA: Diagnosis not present

## 2021-04-10 DIAGNOSIS — R531 Weakness: Secondary | ICD-10-CM | POA: Insufficient documentation

## 2021-04-10 DIAGNOSIS — F17210 Nicotine dependence, cigarettes, uncomplicated: Secondary | ICD-10-CM | POA: Insufficient documentation

## 2021-04-10 DIAGNOSIS — R42 Dizziness and giddiness: Secondary | ICD-10-CM | POA: Diagnosis not present

## 2021-04-10 DIAGNOSIS — I499 Cardiac arrhythmia, unspecified: Secondary | ICD-10-CM | POA: Diagnosis not present

## 2021-04-10 DIAGNOSIS — R55 Syncope and collapse: Secondary | ICD-10-CM | POA: Diagnosis not present

## 2021-04-10 LAB — BASIC METABOLIC PANEL
Anion gap: 8 (ref 5–15)
Anion gap: 12 (ref 5–15)
BUN: 10 mg/dL (ref 6–20)
BUN: 14 mg/dL (ref 6–20)
CO2: 24 mmol/L (ref 22–32)
CO2: 23 mmol/L (ref 22–32)
Calcium: 9.4 mg/dL (ref 8.9–10.3)
Calcium: 9.9 mg/dL (ref 8.9–10.3)
Chloride: 102 mmol/L (ref 98–111)
Chloride: 105 mmol/L (ref 98–111)
Creatinine, Ser: 0.99 mg/dL (ref 0.61–1.24)
Creatinine, Ser: 1.06 mg/dL (ref 0.61–1.24)
GFR, Estimated: >60 mL/min (ref >60)
GFR, Estimated: >60 mL/min (ref >60)
Glucose, Bld: 94 mg/dL (ref 70–99)
Glucose, Bld: 87 mg/dL (ref 70–99)
Potassium: 4 mmol/L (ref 3.5–5.1)
Potassium: 3.6 mmol/L (ref 3.5–5.1)
Sodium: 134 mmol/L — ABNORMAL LOW (ref 135–145)
Sodium: 140 mmol/L (ref 135–145)

## 2021-04-10 LAB — CBC
HCT: 40.1 % (ref 39.0–52.0)
HCT: 40.9 % (ref 39.0–52.0)
Hemoglobin: 12.9 g/dL — ABNORMAL LOW (ref 13.0–17.0)
Hemoglobin: 13.2 g/dL (ref 13.0–17.0)
MCH: 29.6 pg (ref 26.0–34.0)
MCH: 29.4 pg (ref 26.0–34.0)
MCHC: 32.2 g/dL (ref 30.0–36.0)
MCHC: 32.3 g/dL (ref 30.0–36.0)
MCV: 92 fL (ref 80.0–100.0)
MCV: 91.1 fL (ref 80.0–100.0)
Platelets: 307 10*3/uL (ref 150–400)
Platelets: 305 10*3/uL (ref 150–400)
RBC: 4.36 MIL/uL (ref 4.22–5.81)
RBC: 4.49 MIL/uL (ref 4.22–5.81)
RDW: 13.2 % (ref 11.5–15.5)
RDW: 13.1 % (ref 11.5–15.5)
WBC: 5 10*3/uL (ref 4.0–10.5)
WBC: 6.1 10*3/uL (ref 4.0–10.5)
nRBC: 0 % (ref 0.0–0.2)
nRBC: 0 % (ref 0.0–0.2)

## 2021-04-10 LAB — URINALYSIS, ROUTINE W REFLEX MICROSCOPIC
Bilirubin Urine: NEGATIVE
Glucose, UA: NEGATIVE mg/dL
Hgb urine dipstick: NEGATIVE
Ketones, ur: 20 mg/dL — AB
Leukocytes,Ua: NEGATIVE
Nitrite: NEGATIVE
Protein, ur: NEGATIVE mg/dL
Specific Gravity, Urine: 1.019 (ref 1.005–1.030)
pH: 5 (ref 5.0–8.0)

## 2021-04-10 LAB — HEPATIC FUNCTION PANEL
ALT: 23 U/L (ref 0–44)
AST: 19 U/L (ref 15–41)
Albumin: 4.2 g/dL (ref 3.5–5.0)
Alkaline Phosphatase: 64 U/L (ref 38–126)
Bilirubin, Direct: <0.1 mg/dL (ref 0.0–0.2)
Total Bilirubin: 0.2 mg/dL — ABNORMAL LOW (ref 0.3–1.2)
Total Protein: 8.4 g/dL — ABNORMAL HIGH (ref 6.5–8.1)

## 2021-04-10 LAB — TSH: TSH: 1.689 u[IU]/mL (ref 0.350–4.500)

## 2021-04-10 LAB — TROPONIN I (HIGH SENSITIVITY)
Troponin I (High Sensitivity): 5 ng/L (ref <18)
Troponin I (High Sensitivity): 5 ng/L (ref <18)

## 2021-04-10 LAB — LIPASE, BLOOD: Lipase: 32 U/L (ref 11–51)

## 2021-04-10 LAB — CBG MONITORING, ED: Glucose-Capillary: 88 mg/dL (ref 70–99)

## 2021-04-10 MED ORDER — LIDOCAINE VISCOUS HCL 2 % MT SOLN
15.0000 mL | Freq: Once | OROMUCOSAL | Status: AC
  Administered 2021-04-10: 15 mL via ORAL
  Filled 2021-04-10: qty 15

## 2021-04-10 MED ORDER — DIPHENHYDRAMINE HCL 50 MG/ML IJ SOLN
INTRAMUSCULAR | Status: AC
  Filled 2021-04-10: qty 1

## 2021-04-10 MED ORDER — DIPHENHYDRAMINE HCL 50 MG/ML IJ SOLN
25.0000 mg | Freq: Once | INTRAMUSCULAR | Status: AC
  Administered 2021-04-10: 25 mg via INTRAVENOUS

## 2021-04-10 MED ORDER — PROCHLORPERAZINE EDISYLATE 10 MG/2ML IJ SOLN
5.0000 mg | Freq: Once | INTRAMUSCULAR | Status: AC
  Administered 2021-04-10: 5 mg via INTRAVENOUS
  Filled 2021-04-10: qty 2

## 2021-04-10 MED ORDER — SODIUM CHLORIDE 0.9 % IV BOLUS
1000.0000 mL | Freq: Once | INTRAVENOUS | Status: AC
  Administered 2021-04-10: 1000 mL via INTRAVENOUS

## 2021-04-10 MED ORDER — OMEPRAZOLE 40 MG PO CPDR: 40.0000 mg | DELAYED_RELEASE_CAPSULE | Freq: Every day | ORAL | 0 refills | Status: DC

## 2021-04-10 MED ORDER — DIPHENHYDRAMINE HCL 50 MG/ML IJ SOLN
12.5000 mg | Freq: Once | INTRAMUSCULAR | Status: AC
  Administered 2021-04-10: 12.5 mg via INTRAVENOUS
  Filled 2021-04-10: qty 1

## 2021-04-10 MED ORDER — ALUM & MAG HYDROXIDE-SIMETH 200-200-20 MG/5ML PO SUSP
30.0000 mL | Freq: Once | ORAL | Status: AC
  Administered 2021-04-10: 30 mL via ORAL
  Filled 2021-04-10: qty 30

## 2021-04-10 NOTE — Discharge Instructions (Addendum)
You were seen in the ER for chest pain, generalized weakness, headache, confusion  Labs, imaging, all were normal  Your symptoms improved with migraine medicine and GI medicines  You had an adverse reaction to compazine -which improved with benadryl  At this time, the cause of your symptoms is unclear.  We discussed the possibilities of musculoskeletal chest pain, psychosocial or emotional stress, GI problem, tension headache  Consider stopping alcohol and marijuana and tobacco use  Take daily omeprazole (acid medicine) every morning on an empty stomach  Please call your primary care doctor or cardiology for re-evaluation. They may refer you to another specialist for more evaluation  Please return to ED if: Your chest pain is worse or on exertion You have a cough that gets worse, or you cough up blood. You have severe pain in chest, back or abdomen. You have chest pain with loss of sensation, weakness or tingling in extremities You have chest pain or shortness of breath with exertion or activity You have sudden, unexplained discomfort in your chest, with radiation arms, back, neck, or jaw. You suddenly have chest pain and begin to sweat, or your skin gets clammy. You feel chest pain with nausea or vomiting, blood in vomit You suddenly feel light-headed or faint. Your heart begins to beat quickly, or it feels like it is skipping beats. You have one sided leg swelling or calf pain You have chest pain with fever, chills, cough or other viral symptoms

## 2021-04-10 NOTE — ED Notes (Signed)
Pt had brief episode of "feels like I'm about to fall out". Raspet, PA notified. Pt came out of this within about 30 seconds. Pt AxO x4.

## 2021-04-10 NOTE — ED Triage Notes (Signed)
Pt here from [facility] with c/o cp along with some dizziness and confusion at work today

## 2021-04-10 NOTE — ED Provider Notes (Signed)
[facility]-EMERGENCY DEPT Provider Note   CSN: [id number] Arrival date & time: 04/10/21  1849     History Chief Complaint  Patient presents with  . Chest Pain    Victor Burns is a 30 y.o. male presents to ER for evaluation of chest pain. This has been going on since January of 2022.  Today he woke up at 0412 and while still in bed developed generalized weakness like he was going to pass out. Developed chest pain described as sharp, left sided, constant, non radiating. He used his asthma inhaler and it didn't help. Got to work at 10 am developed sudden onset headache behind both of his eyes that radiates to top right of scalp. His CP worsened. He became "confused" "discombobulated" while at work, got hot flashes. Similar episodes have happened 5-6 times since January. Has been to urgent care and ER for evaluation several times.  Urgent care today told him to come to ER because his EKG looked different than previous.  They told him to go to cardiology as well but he hasn't because providers in ER have told him he didn't need to go to cardiology.  States in the past splashing ice, water, cold air AC around his head, neck have made symptoms better. States he doesn't stay active but when he does exercise he has never had exertional CP or SOB.  Has had palpitations in the past occasionally.  Smokes marijuana. Has used cocaine in the past - last several weeks ago.  Reports drinking heavily at Saturday-Monday. He drinks 1/2 gallon of liquor each day.  Denies any previous or current GI issues, acid reflux, gastritis, ulcers. Denies worsening CP after meals. Can't think of any triggers.  Denies any recent psychosocial stressors. States the symptoms make him anxious.  Currently CP is 8/10.  Mother at bedside states there is no family history of CAD. She has CP years ago and was admitted for work up and was told it was anxiety. States anxiety runs in the family.  Patient denies IVDU. Patient  asks if this could be from being dehydrated. States he probably drinks more alcohol than water every week.   No fever, cough. No syncope. No LE edema, calf pain, hemoptysis. PERC negative.    HPI     Past Medical History:  Diagnosis Date  . Abscess   . Arthritis    knee, back  . Asthma     Patient Active Problem List   Diagnosis Date Noted  . Perirectal abscess s/p I&D 06/16/2018 06/16/2018  . Alcohol abuse 06/16/2018  . Mild persistent asthma 10/07/2017    Past Surgical History:  Procedure Laterality Date  . EVALUATION UNDER ANESTHESIA WITH HEMORRHOIDECTOMY N/A 08/08/2019   Procedure: EXAM UNDER ANESTHESIA WITH IRRIGATION AND DRAINAGE OF PERIRECTAL ABSCESS;  Surgeon: Andria Meuse, MD;  Location: [facility];  Service: General;  Laterality: N/A;  . FISTULOTOMY N/A 02/20/2020   Procedure: PARTIAL  FISTULOTOMY WITH PLACEMENT OF CUTTING TETON;  Surgeon: Andria Meuse, MD;  Location: [facility];  Service: General;  Laterality: N/A;  . INCISION AND DRAINAGE PERIRECTAL ABSCESS N/A 06/16/2018   Procedure: Francia Greaves UNDER ANESTHESIA IRRIGATION AND DEBRIDEMENT PERIRECTAL ABSCESS;  Surgeon: Emelia Loron, MD;  Location: [facility];  Service: General;  Laterality: N/A;  . KNEE ARTHROSCOPY    . PLACEMENT OF SETON  08/08/2019   Procedure: PLACEMENT OF DRAINING SETON;  Surgeon: Andria Meuse, MD;  Location: [facility];  Service: General;;  . RECTAL EXAM UNDER  ANESTHESIA N/A 02/20/2020   Procedure: ANORECTAL EXAM UNDER ANESTHESIA;  Surgeon: Andria MeuseWhite, Christopher M, MD;  Location: [facility];  Service: General;  Laterality: N/A;       Family History  Problem Relation Age of Onset  . Hypertension Mother   . Hypertension Father     Social History   Tobacco Use  . Smoking status: Current Every Day Smoker    Packs/day: 0.25    Years: 10.00    Pack years: 2.50    Types: Cigarettes  . Smokeless tobacco: Never Used  Vaping Use  . Vaping Use: Never used  Substance Use Topics  . Alcohol use:  Not Currently    Alcohol/week: 10.0 standard drinks    Types: 10 Cans of beer per week    Comment: 2 bottles liquor/ per wk  . Drug use: Yes    Types: Marijuana, Cocaine, MDMA (Ecstacy)    Home Medications Prior to Admission medications   Medication Sig Start Date End Date Taking? Authorizing Provider  omeprazole (PRILOSEC) 40 MG capsule Take 1 capsule (40 mg total) by mouth daily. 04/10/21 05/10/21 Yes Liberty HandyGibbons, [place] Denison J, PA-C  acetaminophen (TYLENOL) 500 MG tablet Take 2 tablets (1,000 mg total) by mouth every 6 (six) hours as needed (for pain.). 02/20/20   Andria MeuseWhite, Christopher M, MD  albuterol (VENTOLIN HFA) 108 (90 Base) MCG/ACT inhaler Inhale 1-2 puffs into the lungs every 6 (six) hours as needed for wheezing or shortness of breath. 03/04/21   Sabas SousBero, Michael M, MD  hydrOXYzine (ATARAX/VISTARIL) 25 MG tablet Take 1 tablet (25 mg total) by mouth every 6 (six) hours. 04/08/21   Cardama, Amadeo GarnetPedro Eduardo, MD    Allergies    Bee venom, Shellfish allergy, Compazine [prochlorperazine], and Other  Review of Systems   Review of Systems  Respiratory: Positive for shortness of breath.   Cardiovascular: Positive for chest pain and palpitations.  Neurological: Positive for light-headedness and headaches.  Psychiatric/Behavioral: Positive for confusion.  All other systems reviewed and are negative.   Physical Exam Updated Vital Signs BP 131/83   Pulse 60   Temp 98 F (36.7 C) (Oral)   Resp 18   Ht 5\' 11"  (1.803 m)   Wt 108.9 kg   SpO2 94%   BMI 33.47 kg/m   Physical Exam Constitutional:      Appearance: He is well-developed.     Comments: NAD. Non toxic.   HENT:     Head: Normocephalic and atraumatic.     Nose: Nose normal.  Eyes:     General: Lids are normal.     Conjunctiva/sclera: Conjunctivae normal.  Neck:     Trachea: Trachea normal.     Comments: Trachea midline.  Cardiovascular:     Rate and Rhythm: Normal rate and regular rhythm.     Pulses:          Radial pulses are 1+  on the right side and 1+ on the left side.       Dorsalis pedis pulses are 1+ on the right side and 1+ on the left side.     Heart sounds: Normal heart sounds, S1 normal and S2 normal.     Comments: No murmurs. No LE edema or calf tenderness.  Pulmonary:     Effort: Pulmonary effort is normal.     Breath sounds: Normal breath sounds.  Chest:     Comments: No chest wall tenderness  Abdominal:     General: Bowel sounds are normal.     Palpations:  Abdomen is soft.     Tenderness: There is no abdominal tenderness.     Comments: No epigastric or upper abdominal tenderness.  Musculoskeletal:     Cervical back: Normal range of motion.  Skin:    General: Skin is warm and dry.     Capillary Refill: Capillary refill takes less than 2 seconds.     Comments: No rash to chest wall  Neurological:     Mental Status: He is alert.     GCS: GCS eye subscore is 4. GCS verbal subscore is 5. GCS motor subscore is 6.     Comments:   Mental Status: Patient is awake, alert, oriented to person, place, year, and situation. Patient is able to give a clear and coherent history. Speech is fluent and clear without dysarthria or aphasia.  No signs of neglect.  Cranial Nerves: I not tested II temporal visual fields full bilaterally. PERRL.   III, IV, VI EOMs intact without ptosis V sensation to light touch intact in all 3 divisions of trigeminal nerve bilaterally  VII facial movements symmetric bilaterally VIII hearing intact to voice/conversation  IX, X no uvula deviation, symmetric rise of soft palate/uvula XI 5/5 SCM and trapezius strength bilaterally  XII tongue protrusion midline, symmetric L/R movements  Motor: Strength 5/5 in upper/lower extremities.   Sensation to light touch intact in face, upper/lower extremities. No pronator drift. No leg drop.  Cerebellar: No ataxia with finger to nose.  Psychiatric:        Speech: Speech normal.        Behavior: Behavior normal.        Thought Content:  Thought content normal.     ED Results / Procedures / Treatments   Labs (all labs ordered are listed, but only abnormal results are displayed) Labs Reviewed  URINALYSIS, ROUTINE W REFLEX MICROSCOPIC - Abnormal; Notable for the following components:      Result Value   Ketones, ur 20 (*)    All other components within normal limits  HEPATIC FUNCTION PANEL - Abnormal; Notable for the following components:   Total Protein 8.4 (*)    Total Bilirubin 0.2 (*)    All other components within normal limits  BASIC METABOLIC PANEL  CBC  TSH  LIPASE, BLOOD  CBG MONITORING, ED    EKG EKG Interpretation  Date/Time:  Friday Apr 10 2021 19:02:51 EDT Ventricular Rate:  62 PR Interval:  171 QRS Duration: 96 QT Interval:  390 QTC Calculation: 396 R Axis:   46 Text Interpretation: Sinus rhythm ST elev, probable normal early repol pattern 12 Lead; Mason-Likar similar to earlier in the day and May 3 Confirmed by Pricilla Loveless [phone]) on 04/10/2021 7:13:04 PM   Radiology DG Chest 2 View  Result Date: 04/10/2021 CLINICAL DATA:  Chest pain. EXAM: CHEST - 2 VIEW COMPARISON:  Chest x-ray dated Apr 07, 2021. FINDINGS: The heart size and mediastinal contours are within normal limits. Both lungs are clear. The visualized skeletal structures are unremarkable. IMPRESSION: No active cardiopulmonary disease. Electronically Signed   By: Obie Dredge M.D.   On: 04/10/2021 13:27   CT Head Wo Contrast  Result Date: 04/10/2021 CLINICAL DATA:  Dizziness and confusion. EXAM: CT HEAD WITHOUT CONTRAST TECHNIQUE: Contiguous axial images were obtained from the base of the skull through the vertex without intravenous contrast. COMPARISON:  May 18, 2018 FINDINGS: Brain: No evidence of acute infarction, hemorrhage, hydrocephalus, extra-axial collection or mass lesion/mass effect. Vascular: No hyperdense vessel or unexpected calcification. Skull:  Normal. Negative for fracture or focal lesion. Sinuses/Orbits: An 8 mm x 7  mm anterior left maxillary sinus polyp versus mucous retention cyst is seen. Other: None. IMPRESSION: 1. No acute intracranial abnormality. 2. Small left maxillary sinus polyp versus mucous retention cyst. Electronically Signed   By: Aram Candela M.D.   On: 04/10/2021 17:58    Procedures Procedures   Medications Ordered in ED Medications  diphenhydrAMINE (BENADRYL) injection 12.5 mg (12.5 mg Intravenous Given 04/10/21 2043)  sodium chloride 0.9 % bolus 1,000 mL (0 mLs Intravenous Stopped 04/10/21 2130)  prochlorperazine (COMPAZINE) injection 5 mg (5 mg Intravenous Given 04/10/21 2044)  alum & mag hydroxide-simeth (MAALOX/MYLANTA) 200-200-20 MG/5ML suspension 30 mL (30 mLs Oral Given 04/10/21 2042)    And  lidocaine (XYLOCAINE) 2 % viscous mouth solution 15 mL (15 mLs Oral Given 04/10/21 2042)  diphenhydrAMINE (BENADRYL) injection 25 mg (25 mg Intravenous Given 04/10/21 2120)  sodium chloride 0.9 % bolus 1,000 mL (1,000 mLs Intravenous New Bag/Given 04/10/21 2131)    ED Course  I have reviewed the triage vital signs and the nursing notes.  Pertinent labs & imaging results that were available during my care of the patient were reviewed by me and considered in my medical decision making (see chart for details).  Clinical Course as of 04/10/21 2319  Fri Apr 10, 2021  2129 I was notified by RN patient had sudden onset restlessness, diaphoresis 30 min after compazine/benadryl. Repeat BO SBP in 90s.  Patient mentating well, states he feels on edge, restless. Bendaryl reordered, symptoms improving  [CG]  2230 Patient re-evaluated. Reports 100% improvement in headache and 50% improvement in CP.  [CG]    Clinical Course User Index [CG] Liberty Handy, PA-C   MDM Rules/Calculators/A&P                          30 yo M presents for left sided sharp constant CP since 0412 this morning when he woke up.  Associated with atypical features including headache, confusion "discombobulated", SOB.  Ongoing for  5 months.  Significant ETOH use. Tobacco and marijuana use. Remote history of cocaine use. H/o asthma.   No red flags like fever, cough, hemoptysis. No trauma. No associated abdominal pain. No tearing back pain, extremity paresthesias, numbness or weakness. No IVDU.   Patient triaged at [facility] but LWBS, triaged again by [facility] provider here.  Labs, imaging ordered prior to my evaluation. I personally reviewed and interpreted these.   Labs reveal - Trop x 2 undetectable. LFT, lipase normal. TSH normal. UA unremarkable.   Imaging reveals - CT unremarkable. CT head negative. EKG shows early repolarization pattern similar to earlier today and 5/3.    Patient given migraine and GI cocktail.  He had a reaction 30 min after compazine which completely resolved after second dose of benadryl.  Headache completely resolved.  CP improved by 50% after GI medicines.    Patient has had several ER evaluation for CP, all benign.  His HEART score is < 3.  PERC negative. Overall highly unlikely this is ACS. No evidence of myocarditis. No murmurs, endocarditis unlikely as well.  ?GI given alcohol use, improvement with GI cocktail.  Will discharge with PCP/cardiology follow given recurrent ER visits for same. Return precautions given.   Final Clinical Impression(s) / ED Diagnoses Final diagnoses:  Atypical chest pain  Periorbital headache    Rx / DC Orders ED Discharge Orders  Ordered    omeprazole (PRILOSEC) 40 MG capsule  Daily        04/10/21 2255           Jerrell Mylar 04/10/21 2319    Pollyann Savoy, MD 04/11/21 [phone]

## 2021-04-10 NOTE — ED Notes (Signed)
Called EMS for transport. 

## 2021-04-10 NOTE — ED Triage Notes (Signed)
Patient c/o dizziness, chest pain, weakness, headache, and confusion. Patient went to [facility] and was told to go to [facility]. Patient  to [facility] and left due to wait times.  patient states his chest pain has worsened. Patient continues to c/o weakness and a headache.

## 2021-04-10 NOTE — ED Notes (Signed)
Called report to ED charge [place], Charity fundraiser.

## 2021-04-10 NOTE — ED Notes (Signed)
Provider at bedside evaluating pt.

## 2021-04-10 NOTE — ED Notes (Signed)
Patient reports chest pain and headache. Also reports feeling faint and being unable to to talk and disoriented briefly while at work. Mom is at bedside. Labs collected and IV started.

## 2021-04-10 NOTE — ED Provider Notes (Signed)
[facility]    CSN: [id number] Arrival date & time: 04/10/21  1134      History   Chief Complaint Chief Complaint  Patient presents with  . Weakness  . Chest Pain  . Dizziness  . Altered Mental Status    HPI Victor Burns is a 30 y.o. male.   Patient presents today with a several hour history of left-sided chest pain.  He reports associated dizziness, generalized weakness, fatigue, confusion.  He was seen by our clinic 04/07/2021 which when he was encouraged to go to the emergency room for further evaluation.  Work-up at the emergency room including troponins were negative and patient was given prescription for hydroxyzine.  Patient reports symptoms improved and then he had recurrent episodes starting several hours ago.  He reports associated dizziness, severe headache, confusion where he was zoning out and having trouble concentrating.  He denies any recent medication changes, head injury, alcohol or drug use in the last 48 hours.  He denies history of cardiovascular disease.     Past Medical History:  Diagnosis Date  . Abscess   . Arthritis    knee, back  . Asthma     Patient Active Problem List   Diagnosis Date Noted  . Perirectal abscess s/p I&D 06/16/2018 06/16/2018  . Alcohol abuse 06/16/2018  . Mild persistent asthma 10/07/2017    Past Surgical History:  Procedure Laterality Date  . EVALUATION UNDER ANESTHESIA WITH HEMORRHOIDECTOMY N/A 08/08/2019   Procedure: EXAM UNDER ANESTHESIA WITH IRRIGATION AND DRAINAGE OF PERIRECTAL ABSCESS;  Surgeon: Andria Meuse, MD;  Location: [facility];  Service: General;  Laterality: N/A;  . FISTULOTOMY N/A 02/20/2020   Procedure: PARTIAL  FISTULOTOMY WITH PLACEMENT OF CUTTING TETON;  Surgeon: Andria Meuse, MD;  Location: [facility];  Service: General;  Laterality: N/A;  . INCISION AND DRAINAGE PERIRECTAL ABSCESS N/A 06/16/2018   Procedure: Francia Greaves UNDER ANESTHESIA IRRIGATION AND DEBRIDEMENT PERIRECTAL ABSCESS;  Surgeon:  Emelia Loron, MD;  Location: [facility];  Service: General;  Laterality: N/A;  . KNEE ARTHROSCOPY    . PLACEMENT OF SETON  08/08/2019   Procedure: PLACEMENT OF DRAINING SETON;  Surgeon: Andria Meuse, MD;  Location: [facility];  Service: General;;  . RECTAL EXAM UNDER ANESTHESIA N/A 02/20/2020   Procedure: ANORECTAL EXAM UNDER ANESTHESIA;  Surgeon: Andria Meuse, MD;  Location: [facility];  Service: General;  Laterality: N/A;       Home Medications    Prior to Admission medications   Medication Sig Start Date End Date Taking? Authorizing Provider  acetaminophen (TYLENOL) 500 MG tablet Take 2 tablets (1,000 mg total) by mouth every 6 (six) hours as needed (for pain.). 02/20/20  Yes Andria Meuse, MD  albuterol (VENTOLIN HFA) 108 (90 Base) MCG/ACT inhaler Inhale 1-2 puffs into the lungs every 6 (six) hours as needed for wheezing or shortness of breath. 03/04/21   Sabas Sous, MD  hydrOXYzine (ATARAX/VISTARIL) 25 MG tablet Take 1 tablet (25 mg total) by mouth every 6 (six) hours. 04/08/21   Nira Conn, MD    Family History Family History  Problem Relation Age of Onset  . Hypertension Mother   . Hypertension Father     Social History Social History   Tobacco Use  . Smoking status: Current Every Day Smoker    Packs/day: 0.25    Years: 10.00    Pack years: 2.50    Types: Cigarettes  . Smokeless tobacco: Never Used  Vaping Use  .  Vaping Use: Never used  Substance Use Topics  . Alcohol use: Not Currently    Alcohol/week: 10.0 standard drinks    Types: 10 Cans of beer per week    Comment: 2 bottles liquor/ per wk  . Drug use: Yes    Types: Marijuana, Cocaine, MDMA (Ecstacy)    Comment: last use 02/16/20     Allergies   Bee venom, Shellfish allergy, and Other   Review of Systems Review of Systems  Constitutional: Positive for activity change and fatigue. Negative for appetite change and fever.  Respiratory: Negative for cough and shortness of  breath.   Cardiovascular: Positive for chest pain.  Gastrointestinal: Negative for abdominal pain, diarrhea, nausea and vomiting.  Neurological: Positive for dizziness, weakness, light-headedness and headaches.     Physical Exam Triage Vital Signs ED Triage Vitals  Enc Vitals Group     BP      Pulse      Resp      Temp      Temp src      SpO2      Weight      Height      Head Circumference      Peak Flow      Pain Score      Pain Loc      Pain Edu?      Excl. in GC?    No data found.  Updated Vital Signs BP 132/76   Pulse 66   Temp 98.3 F (36.8 C)   Resp 20   SpO2 98%   Visual Acuity Right Eye Distance:   Left Eye Distance:   Bilateral Distance:    Right Eye Near:   Left Eye Near:    Bilateral Near:     Physical Exam Vitals reviewed.  Constitutional:      General: He is awake.     Appearance: Normal appearance. He is normal weight. He is not ill-appearing.     Comments: Very pleasant male appears stated age sitting comfortably in triage no acute distress  HENT:     Head: Normocephalic and atraumatic.     Mouth/Throat:     Pharynx: No oropharyngeal exudate, posterior oropharyngeal erythema or uvula swelling.  Cardiovascular:     Rate and Rhythm: Normal rate and regular rhythm.     Heart sounds: No murmur heard.   Pulmonary:     Effort: Pulmonary effort is normal.     Breath sounds: Normal breath sounds. No stridor. No wheezing, rhonchi or rales.     Comments: Clear to auscultation bilaterally Abdominal:     General: Bowel sounds are normal.     Palpations: Abdomen is soft.     Tenderness: There is no abdominal tenderness.  Neurological:     Mental Status: He is alert and oriented to person, place, and time.     Motor: Motor function is intact.  Psychiatric:        Behavior: Behavior is cooperative.      UC Treatments / Results  Labs (all labs ordered are listed, but only abnormal results are displayed) Labs Reviewed - No data to  display  EKG   Radiology No results found.  Procedures Procedures (including critical care time)  Medications Ordered in UC Medications - No data to display  Initial Impression / Assessment and Plan / UC Course  I have reviewed the triage vital signs and the nursing notes.  Pertinent labs & imaging results that were available during my care  of the patient were reviewed by me and considered in my medical decision making (see chart for details).      EKG obtained in triage showed sinus bradycardia with ventricular rate of 58 bpm.  Compared to 04/07/2021 tracing patient has T wave inversion in V1 and increased ST elevation in V2 through V6.  Vital signs are stable.  Given clinical presentation and EKG changes patient was emergently transported by EMS to ER for further evaluation.  Final Clinical Impressions(s) / UC Diagnoses   Final diagnoses:  Chest pain, unspecified type  Acute confusional state  Nonintractable headache, unspecified chronicity pattern, unspecified headache type  Dizziness and giddiness     Discharge Instructions     GO TO ER.     ED Prescriptions    None     PDMP not reviewed this encounter.   Jeani Hawking, PA-C 04/10/21 1155

## 2021-04-10 NOTE — ED Notes (Signed)
Patient is being discharged from the Urgent Care and sent to the Emergency Department via EMS. Per Raspet, PA, patient is in need of higher level of care due to Chest pain, dizziness and reports of confusion. Patient is aware and verbalizes understanding of plan of care.  Vitals:   04/10/21 1141  BP: 132/76  Pulse: 66  Resp: 20  Temp: 98.3 F (36.8 C)  SpO2: 98%

## 2021-04-10 NOTE — ED Notes (Signed)
Pt mother came in to pick him up. IV removed from arm

## 2021-04-10 NOTE — ED Provider Notes (Signed)
Emergency Medicine Provider Triage Evaluation Note  Victor Burns , a 30 y.o. male  was evaluated in triage.  Pt complains of CP, headache, lightheadedness. Sent from urgent care.  At 930 he started having sharp left-sided chest pain that has been constant.  Had a 5-minute period became confused and disoriented, coworker came over to check on him.  He also has felt lightheaded.  For similar chest pains 2 days ago and had reassuring work-up, but is now having intermittent confusion and headache which is new.  Review of Systems  Positive: Chest pain, headache, lightheadedness, confusion Negative: Abdominal pain, shortness of breath, vomiting, syncope  Physical Exam  BP (!) 140/91 (BP Location: Right Arm)   Pulse 79   Temp 98.2 F (36.8 C) (Oral)   Resp 18   SpO2 98%  Gen:   Awake, no distress   Resp:  Normal effort , CTA bilat Cardiac: RRR MSK:   Moves extremities without difficulty    Medical Decision Making  Medically screening exam initiated at 12:34 PM.  Appropriate orders placed.  Victor Burns was informed that the remainder of the evaluation will be completed by another provider, this initial triage assessment does not replace that evaluation, and the importance of remaining in the ED until their evaluation is complete.     [name], PA-C 04/10/21 1241    Benjiman Core, MD 04/10/21 [phone]

## 2021-04-10 NOTE — Discharge Instructions (Addendum)
GO TO ER °

## 2021-04-10 NOTE — ED Triage Notes (Signed)
Pt woke up feeling weak, chest pain began 0900 on left side. Pt states he was at work and had bad headaches, getting confused doing everyday tasks. Pt also reports dizziness starting about 0900. Reports asthma is only medical problem.   States was here Tuesday for same and sent to ER.

## 2021-04-10 NOTE — ED Notes (Signed)
Walked into room, patient was trembling and shaking, stating he felt hot. Verbal order for 25mg  of bendaryl IV from [place] PA.

## 2021-04-10 NOTE — ED Provider Notes (Signed)
Emergency Medicine Provider Triage Evaluation Note  Victor Burns , a 30 y.o. male  was evaluated in triage.  Pt complains of weakness.  Review of Systems  Positive: Dizziness, confusion, cp, headache Negative: Fever, cough, dysuria, focal numbness or focal weakness  Physical Exam  BP (!) 160/97 (BP Location: Left Arm)   Pulse 65   Temp 98.2 F (36.8 C) (Oral)   Resp 17   Ht 5\' 11"  (1.803 m)   Wt 108.9 kg   SpO2 99%   BMI 33.47 kg/m  Gen:   Awake, no distress   Resp:  Normal effort  MSK:   Moves extremities without difficulty  Other:    Medical Decision Making  Medically screening exam initiated at 7:17 PM.  Appropriate orders placed.  Victor Burns was informed that the remainder of the evaluation will be completed by another provider, this initial triage assessment does not replace that evaluation, and the importance of remaining in the ED until their evaluation is complete.  Patient report recurrent episodes of generalized weakness lightheadedness chest pain and headache which has been happening periodically since January.  No history of diabetes or thyroid disease.  Admits to alcohol use and tobacco use.  Denies marijuana abuse   February, Victor Burns 04/10/21 1919    06/10/21, MD 04/11/21 06/11/21

## 2021-04-10 NOTE — ED Notes (Signed)
Erin Raspet PA notified of pt's symptoms.

## 2021-06-01 DIAGNOSIS — I209 Angina pectoris, unspecified: Secondary | ICD-10-CM | POA: Diagnosis not present

## 2021-06-01 DIAGNOSIS — I10 Essential (primary) hypertension: Secondary | ICD-10-CM | POA: Diagnosis not present

## 2021-06-01 DIAGNOSIS — R5383 Other fatigue: Secondary | ICD-10-CM | POA: Diagnosis not present

## 2021-06-01 DIAGNOSIS — Z87891 Personal history of nicotine dependence: Secondary | ICD-10-CM | POA: Diagnosis not present

## 2021-06-01 DIAGNOSIS — Z1389 Encounter for screening for other disorder: Secondary | ICD-10-CM | POA: Diagnosis not present

## 2021-06-01 DIAGNOSIS — E785 Hyperlipidemia, unspecified: Secondary | ICD-10-CM | POA: Diagnosis not present

## 2021-06-01 DIAGNOSIS — Z13220 Encounter for screening for lipoid disorders: Secondary | ICD-10-CM | POA: Diagnosis not present

## 2021-06-01 DIAGNOSIS — Z131 Encounter for screening for diabetes mellitus: Secondary | ICD-10-CM | POA: Diagnosis not present

## 2021-06-07 ENCOUNTER — Other Ambulatory Visit: Payer: Self-pay

## 2021-06-07 ENCOUNTER — Emergency Department (HOSPITAL_COMMUNITY): Payer: BC Managed Care – PPO

## 2021-06-07 ENCOUNTER — Emergency Department (HOSPITAL_COMMUNITY)
Admission: EM | Admit: 2021-06-07 | Discharge: 2021-06-07 | Disposition: A | Discharge status: Home / Self Care | Payer: BC Managed Care – PPO | Attending: Emergency Medicine | Admitting: Emergency Medicine

## 2021-06-07 ENCOUNTER — Encounter (HOSPITAL_COMMUNITY): Payer: Self-pay

## 2021-06-07 DIAGNOSIS — F17210 Nicotine dependence, cigarettes, uncomplicated: Secondary | ICD-10-CM | POA: Insufficient documentation

## 2021-06-07 DIAGNOSIS — R0789 Other chest pain: Secondary | ICD-10-CM | POA: Diagnosis not present

## 2021-06-07 DIAGNOSIS — R079 Chest pain, unspecified: Secondary | ICD-10-CM

## 2021-06-07 DIAGNOSIS — R001 Bradycardia, unspecified: Secondary | ICD-10-CM | POA: Insufficient documentation

## 2021-06-07 DIAGNOSIS — R42 Dizziness and giddiness: Secondary | ICD-10-CM | POA: Insufficient documentation

## 2021-06-07 DIAGNOSIS — R0602 Shortness of breath: Secondary | ICD-10-CM | POA: Diagnosis not present

## 2021-06-07 DIAGNOSIS — J45909 Unspecified asthma, uncomplicated: Secondary | ICD-10-CM | POA: Diagnosis not present

## 2021-06-07 LAB — CBC WITH DIFFERENTIAL/PLATELET
Abs Immature Granulocytes: 0.01 10*3/uL (ref 0.00–0.07)
Basophils Absolute: 0 10*3/uL (ref 0.0–0.1)
Basophils Relative: 1 %
Eosinophils Absolute: 0 10*3/uL (ref 0.0–0.5)
Eosinophils Relative: 1 %
HCT: 39.4 % (ref 39.0–52.0)
Hemoglobin: 12.8 g/dL — ABNORMAL LOW (ref 13.0–17.0)
Immature Granulocytes: 0 %
Lymphocytes Relative: 35 %
Lymphs Abs: 1.5 10*3/uL (ref 0.7–4.0)
MCH: 29.3 pg (ref 26.0–34.0)
MCHC: 32.5 g/dL (ref 30.0–36.0)
MCV: 90.2 fL (ref 80.0–100.0)
Monocytes Absolute: 0.4 10*3/uL (ref 0.1–1.0)
Monocytes Relative: 8 %
Neutro Abs: 2.4 10*3/uL (ref 1.7–7.7)
Neutrophils Relative %: 55 %
Platelets: 310 10*3/uL (ref 150–400)
RBC: 4.37 MIL/uL (ref 4.22–5.81)
RDW: 13 % (ref 11.5–15.5)
WBC: 4.3 10*3/uL (ref 4.0–10.5)
nRBC: 0 % (ref 0.0–0.2)

## 2021-06-07 LAB — BASIC METABOLIC PANEL
Anion gap: 9 (ref 5–15)
BUN: 13 mg/dL (ref 6–20)
CO2: 26 mmol/L (ref 22–32)
Calcium: 9.2 mg/dL (ref 8.9–10.3)
Chloride: 103 mmol/L (ref 98–111)
Creatinine, Ser: 1.14 mg/dL (ref 0.61–1.24)
GFR, Estimated: >60 mL/min (ref >60)
Glucose, Bld: 99 mg/dL (ref 70–99)
Potassium: 4 mmol/L (ref 3.5–5.1)
Sodium: 138 mmol/L (ref 135–145)

## 2021-06-07 LAB — TROPONIN I (HIGH SENSITIVITY): Troponin I (High Sensitivity): 4 ng/L (ref <18)

## 2021-06-07 MED ORDER — SODIUM CHLORIDE 0.9 % IV BOLUS
1000.0000 mL | Freq: Once | INTRAVENOUS | Status: AC
  Administered 2021-06-07: 1000 mL via INTRAVENOUS

## 2021-06-07 MED ORDER — KETOROLAC TROMETHAMINE 15 MG/ML IJ SOLN
15.0000 mg | Freq: Once | INTRAMUSCULAR | Status: AC
  Administered 2021-06-07: 15 mg via INTRAVENOUS
  Filled 2021-06-07: qty 1

## 2021-06-07 NOTE — Discharge Instructions (Addendum)
If you develop recurrent, continued, or worsening chest pain, shortness of breath, fever, vomiting, abdominal or back pain, or any other new/concerning symptoms then return to the ER for evaluation.  

## 2021-06-07 NOTE — ED Triage Notes (Signed)
Pt states he is having CP that is going to his back. Pt states he has been feeling "really sleepy and faint the last couple of days."

## 2021-06-07 NOTE — ED Provider Notes (Signed)
[facility]-EMERGENCY DEPT Provider Note   CSN: [id number] Arrival date & time: 06/07/21  1054     History Chief Complaint  Patient presents with   Chest Pain   Back Pain   Fatigue    Beryle Zeitz is a 30 y.o. male.  HPI 30 year old male presents with chest pain.  This has been ongoing since January.  It is left-sided and feels sharp and dull.  Waxes and wanes but is always there.  Recently has been feeling intermittently short of breath as well as intermittent dizziness.  For the past week or so the pain is also been in his back and side and it seems to be getting worse in his chest for the last couple days.  He is tried aspirin, ibuprofen, Tylenol with no relief.  He has not had any fevers or cough.  No leg swelling.  He is try to get into cardiology but is told he needs a referral.  He otherwise has noticed worsening discomfort for the past 2 days, which is why came into the hospital.  Sometimes putting ice on his chest will help and sometimes putting pressure on his chest will help as well as leaning forward can help.  Past Medical History:  Diagnosis Date   Abscess    Arthritis    knee, back   Asthma     Patient Active Problem List   Diagnosis Date Noted   Perirectal abscess s/p I&D 06/16/2018 06/16/2018   Alcohol abuse 06/16/2018   Mild persistent asthma 10/07/2017    Past Surgical History:  Procedure Laterality Date   EVALUATION UNDER ANESTHESIA WITH HEMORRHOIDECTOMY N/A 08/08/2019   Procedure: EXAM UNDER ANESTHESIA WITH IRRIGATION AND DRAINAGE OF PERIRECTAL ABSCESS;  Surgeon: Andria Meuse, MD;  Location: [facility];  Service: General;  Laterality: N/A;   FISTULOTOMY N/A 02/20/2020   Procedure: PARTIAL  FISTULOTOMY WITH PLACEMENT OF CUTTING TETON;  Surgeon: Andria Meuse, MD;  Location: [facility];  Service: General;  Laterality: N/A;   INCISION AND DRAINAGE PERIRECTAL ABSCESS N/A 06/16/2018   Procedure: Francia Greaves UNDER ANESTHESIA IRRIGATION AND  DEBRIDEMENT PERIRECTAL ABSCESS;  Surgeon: Emelia Loron, MD;  Location: [facility];  Service: General;  Laterality: N/A;   KNEE ARTHROSCOPY     PLACEMENT OF SETON  08/08/2019   Procedure: PLACEMENT OF DRAINING SETON;  Surgeon: Andria Meuse, MD;  Location: [facility];  Service: General;;   RECTAL EXAM UNDER ANESTHESIA N/A 02/20/2020   Procedure: ANORECTAL EXAM UNDER ANESTHESIA;  Surgeon: Andria Meuse, MD;  Location: [facility];  Service: General;  Laterality: N/A;       Family History  Problem Relation Age of Onset   Hypertension Mother    Hypertension Father     Social History   Tobacco Use   Smoking status: Every Day    Packs/day: 0.25    Years: 10.00    Pack years: 2.50    Types: Cigarettes   Smokeless tobacco: Never  Vaping Use   Vaping Use: Never used  Substance Use Topics   Alcohol use: Yes    Alcohol/week: 10.0 standard drinks    Types: 10 Cans of beer per week    Comment: 2 bottles liquor/ per wk   Drug use: Yes    Types: Marijuana    Comment: Pt reports smoking every other day (2g)    Home Medications Prior to Admission medications   Medication Sig Start Date End Date Taking? Authorizing Provider  albuterol (VENTOLIN HFA) 108 (90  Base) MCG/ACT inhaler Inhale 1-2 puffs into the lungs every 6 (six) hours as needed for wheezing or shortness of breath. 03/04/21  Yes Sabas Sous, MD  busPIRone (BUSPAR) 5 MG tablet Take 5-10 mg by mouth 3 (three) times daily as needed. panic 05/28/21  Yes [provider]  escitalopram (LEXAPRO) 10 MG tablet Take 10 mg by mouth daily. 05/28/21   [provider]  hydrOXYzine (ATARAX/VISTARIL) 25 MG tablet Take 1 tablet (25 mg total) by mouth every 6 (six) hours. Patient not taking: Reported on 06/07/2021 04/08/21   Nira Conn, MD  omeprazole (PRILOSEC) 40 MG capsule Take 1 capsule (40 mg total) by mouth daily. Patient not taking: Reported on 06/07/2021 04/10/21 05/10/21  Liberty Handy, PA-C     Allergies    Bee venom, Shellfish allergy, Compazine [prochlorperazine], and Other  Review of Systems   Review of Systems  Constitutional:  Negative for fever.  Respiratory:  Positive for shortness of breath. Negative for cough.   Cardiovascular:  Positive for chest pain. Negative for leg swelling.  Gastrointestinal:  Negative for abdominal pain.  Musculoskeletal:  Positive for back pain.  Neurological:  Positive for light-headedness.  All other systems reviewed and are negative.  Physical Exam Updated Vital Signs BP (!) 156/95   Pulse (!) 56   Temp 98.8 F (37.1 C) (Oral)   Resp 16   Wt 114.3 kg   SpO2 100%   BMI 35.15 kg/m   Physical Exam Vitals and nursing note reviewed.  Constitutional:      General: He is not in acute distress.    Appearance: He is well-developed. He is not ill-appearing or diaphoretic.  HENT:     Head: Normocephalic and atraumatic.     Right Ear: External ear normal.     Left Ear: External ear normal.     Nose: Nose normal.  Eyes:     General:        Right eye: No discharge.        Left eye: No discharge.  Cardiovascular:     Rate and Rhythm: Regular rhythm. Bradycardia present.     Heart sounds: Normal heart sounds.  Pulmonary:     Effort: Pulmonary effort is normal.     Breath sounds: Normal breath sounds.  Abdominal:     Palpations: Abdomen is soft.     Tenderness: There is no abdominal tenderness.  Musculoskeletal:     Cervical back: Neck supple.     Right lower leg: No edema.     Left lower leg: No edema.  Skin:    General: Skin is warm and dry.  Neurological:     Mental Status: He is alert.  Psychiatric:        Mood and Affect: Mood is not anxious.    ED Results / Procedures / Treatments   Labs (all labs ordered are listed, but only abnormal results are displayed) Labs Reviewed  CBC WITH DIFFERENTIAL/PLATELET - Abnormal; Notable for the following components:      Result Value   Hemoglobin 12.8 (*)    All other  components within normal limits  BASIC METABOLIC PANEL  TROPONIN I (HIGH SENSITIVITY)    EKG EKG Interpretation  Date/Time:  Sunday June 07 2021 11:08:21 EDT Ventricular Rate:  49 PR Interval:  170 QRS Duration: 88 QT Interval:  410 QTC Calculation: 370 R Axis:   68 Text Interpretation: Sinus bradycardia Early repolarization  similar to May 2022 Confirmed by Pricilla Loveless [phone])  on 06/07/2021 1:36:07 PM  Radiology DG Chest 2 View  Result Date: 06/07/2021 CLINICAL DATA:  Chest pain. EXAM: CHEST - 2 VIEW COMPARISON:  Apr 10, 2021. FINDINGS: The heart size and mediastinal contours are within normal limits. Both lungs are clear. The visualized skeletal structures are unremarkable. IMPRESSION: No active cardiopulmonary disease. Electronically Signed   By: Lupita Raider M.D.   On: 06/07/2021 14:48    Procedures Ultrasound ED Echo  Date/Time: 06/07/2021 3:35 PM Performed by: Pricilla Loveless, MD Authorized by: Pricilla Loveless, MD   Procedure details:    Indications: chest pain     Views: parasternal long axis view and apical 4 chamber view     Images: archived     Limitations:  Acoustic shadowing and body habitus Findings:    Pericardium: no pericardial effusion     LV Function: normal (>50% EF)   Impression:    Impression: normal     Medications Ordered in ED Medications  sodium chloride 0.9 % bolus 1,000 mL (1,000 mLs Intravenous New Bag/Given 06/07/21 1434)  ketorolac (TORADOL) 15 MG/ML injection 15 mg (15 mg Intravenous Given 06/07/21 1433)    ED Course  I have reviewed the triage vital signs and the nursing notes.  Pertinent labs & imaging results that were available during my care of the patient were reviewed by me and considered in my medical decision making (see chart for details).    MDM Rules/Calculators/A&P                          Patient's ECG shows some early repolarization but otherwise his chest x-ray and labs are unremarkable.  Given his positional symptoms,  I did do a bedside echo which does not show an obvious pericardial effusion.  He does feel better with some Toradol.  He has been trying to get into cardiology but does not have a referral so I have referred him.  He otherwise has essentially chronic chest pain and will need to follow-up with cardiology and his PCP, who he has an appointment scheduled with on 7/25.  I have a low suspicion of ACS, PE, dissection, etc. Final Clinical Impression(s) / ED Diagnoses Final diagnoses:  Nonspecific chest pain    Rx / DC Orders ED Discharge Orders     None        Pricilla Loveless, MD 06/07/21 1537

## 2021-06-17 ENCOUNTER — Ambulatory Visit (HOSPITAL_BASED_OUTPATIENT_CLINIC_OR_DEPARTMENT_OTHER): Payer: BC Managed Care – PPO | Admitting: Cardiovascular Disease

## 2021-07-11 ENCOUNTER — Encounter (HOSPITAL_COMMUNITY): Payer: Self-pay | Admitting: Emergency Medicine

## 2021-07-11 ENCOUNTER — Emergency Department (HOSPITAL_COMMUNITY)
Admission: EM | Admit: 2021-07-11 | Discharge: 2021-07-12 | Disposition: A | Discharge status: Home / Self Care | Payer: Commercial Managed Care - PPO | Attending: Emergency Medicine | Admitting: Emergency Medicine

## 2021-07-11 ENCOUNTER — Other Ambulatory Visit: Payer: Self-pay

## 2021-07-11 ENCOUNTER — Emergency Department (HOSPITAL_COMMUNITY): Payer: Commercial Managed Care - PPO

## 2021-07-11 DIAGNOSIS — R0789 Other chest pain: Secondary | ICD-10-CM | POA: Diagnosis not present

## 2021-07-11 DIAGNOSIS — J4530 Mild persistent asthma, uncomplicated: Secondary | ICD-10-CM | POA: Insufficient documentation

## 2021-07-11 DIAGNOSIS — F17210 Nicotine dependence, cigarettes, uncomplicated: Secondary | ICD-10-CM | POA: Insufficient documentation

## 2021-07-11 DIAGNOSIS — E876 Hypokalemia: Secondary | ICD-10-CM | POA: Diagnosis not present

## 2021-07-11 DIAGNOSIS — R079 Chest pain, unspecified: Secondary | ICD-10-CM | POA: Diagnosis not present

## 2021-07-11 NOTE — ED Triage Notes (Signed)
Pt reports severe chest pain for a couple months that worsened today. States the pain radiates to his back and he feels like his left arm is going numb.

## 2021-07-12 LAB — TROPONIN I (HIGH SENSITIVITY)
Troponin I (High Sensitivity): 4 ng/L (ref <18)
Troponin I (High Sensitivity): 4 ng/L (ref <18)

## 2021-07-12 LAB — CBC
HCT: 40 % (ref 39.0–52.0)
Hemoglobin: 13.4 g/dL (ref 13.0–17.0)
MCH: 29.4 pg (ref 26.0–34.0)
MCHC: 33.5 g/dL (ref 30.0–36.0)
MCV: 87.7 fL (ref 80.0–100.0)
Platelets: 339 10*3/uL (ref 150–400)
RBC: 4.56 MIL/uL (ref 4.22–5.81)
RDW: 13.2 % (ref 11.5–15.5)
WBC: 6.2 10*3/uL (ref 4.0–10.5)
nRBC: 0 % (ref 0.0–0.2)

## 2021-07-12 LAB — BASIC METABOLIC PANEL
Anion gap: 12 (ref 5–15)
BUN: 17 mg/dL (ref 6–20)
CO2: 22 mmol/L (ref 22–32)
Calcium: 9.4 mg/dL (ref 8.9–10.3)
Chloride: 102 mmol/L (ref 98–111)
Creatinine, Ser: 1.06 mg/dL (ref 0.61–1.24)
GFR, Estimated: >60 mL/min (ref >60)
Glucose, Bld: 100 mg/dL — ABNORMAL HIGH (ref 70–99)
Potassium: 3.4 mmol/L — ABNORMAL LOW (ref 3.5–5.1)
Sodium: 136 mmol/L (ref 135–145)

## 2021-07-12 MED ORDER — KETOROLAC TROMETHAMINE 15 MG/ML IJ SOLN
15.0000 mg | Freq: Once | INTRAMUSCULAR | Status: AC
  Administered 2021-07-12: 15 mg via INTRAMUSCULAR
  Filled 2021-07-12: qty 1

## 2021-07-12 MED ORDER — PANTOPRAZOLE SODIUM 40 MG PO TBEC
40.0000 mg | DELAYED_RELEASE_TABLET | Freq: Every day | ORAL | Status: DC
  Administered 2021-07-12: 40 mg via ORAL
  Filled 2021-07-12: qty 1

## 2021-07-12 MED ORDER — PANTOPRAZOLE SODIUM 40 MG IV SOLR: 40.0000 mg | Freq: Once | INTRAVENOUS | Status: DC

## 2021-07-12 MED ORDER — KETOROLAC TROMETHAMINE 15 MG/ML IJ SOLN: 15.0000 mg | Freq: Once | INTRAMUSCULAR | Status: DC

## 2021-07-12 MED ORDER — ALUM & MAG HYDROXIDE-SIMETH 200-200-20 MG/5ML PO SUSP
30.0000 mL | Freq: Once | ORAL | Status: AC
  Administered 2021-07-12: 30 mL via ORAL
  Filled 2021-07-12: qty 30

## 2021-07-12 MED ORDER — LIDOCAINE VISCOUS HCL 2 % MT SOLN
15.0000 mL | Freq: Once | OROMUCOSAL | Status: AC
  Administered 2021-07-12: 15 mL via ORAL
  Filled 2021-07-12: qty 15

## 2021-07-12 MED ORDER — PANTOPRAZOLE SODIUM 20 MG PO TBEC: 20.0000 mg | DELAYED_RELEASE_TABLET | Freq: Every day | ORAL | 0 refills | Status: DC

## 2021-07-12 NOTE — Discharge Instructions (Signed)
At this time there does not appear to be the presence of an emergent medical condition, however there is always the potential for conditions to change. Please read and follow the below instructions.  Please return to the Emergency Department immediately for any new or worsening symptoms. Please be sure to follow up with your Primary Care Provider within one week regarding your visit today; please call their office to schedule an appointment even if you are feeling better for a follow-up visit.  You may call [facility] to establish a primary care provider if you do not already have one. You may take the medication Protonix as prescribed to help with your symptoms if they are related to heartburn. Please go to your cardiology appointment as scheduled.  Go to the nearest Emergency Department immediately if: You have fever or chills Your chest pain is worse. You have a cough that gets worse, or you cough up blood. You have very bad (severe) pain in your belly (abdomen). You pass out (faint). You have either of these for no clear reason: Sudden chest discomfort. Sudden discomfort in your arms, back, neck, or jaw. You have shortness of breath at any time. You suddenly start to sweat, or your skin gets clammy. You feel sick to your stomach (nauseous). You throw up (vomit). You suddenly feel lightheaded or dizzy. You feel very weak or tired. Your heart starts to beat fast, or it feels like it is skipping beats. You have any new/concerning or worsening of symptoms.   Please read the additional information packets attached to your discharge summary.  Do not take your medicine if  develop an itchy rash, swelling in your mouth or lips, or difficulty breathing; call 911 and seek immediate emergency medical attention if this occurs.  You may review your lab tests and imaging results in their entirety on your MyChart account.  Please discuss all results of fully with  your primary care provider and other specialist at your follow-up visit.  Note: Portions of this text may have been transcribed using voice recognition software. Every effort was made to ensure accuracy; however, inadvertent computerized transcription errors may still be present.

## 2021-07-12 NOTE — ED Provider Notes (Signed)
[facility]-EMERGENCY DEPT Provider Note   CSN: [id number] Arrival date & time: 07/11/21  2307     History Chief Complaint  Patient presents with   Chest Pain    Victor Burns is a 30 y.o. male reports history of asthma otherwise healthy no daily medication use.  Patient presents for chest pain, he reports history of chronic chest pain ongoing for several months, worse last night described as a sharp burning pressure sensation on left side of his chest, no clear aggravating or alleviating factors has not attempted any medication for symptoms prior to arrival.  Pain is currently mild in intensity.  Patient denies fever/chills, recent illness, shortness of breath, cough/hemoptysis, recent travel/immobilization, extremity swelling/color change, exogenous hormone use, abdominal pain, nausea/vomiting, diarrhea or any additional concerns.  HPI     Past Medical History:  Diagnosis Date   Abscess    Arthritis    knee, back   Asthma     Patient Active Problem List   Diagnosis Date Noted   Perirectal abscess s/p I&D 06/16/2018 06/16/2018   Alcohol abuse 06/16/2018   Mild persistent asthma 10/07/2017    Past Surgical History:  Procedure Laterality Date   EVALUATION UNDER ANESTHESIA WITH HEMORRHOIDECTOMY N/A 08/08/2019   Procedure: EXAM UNDER ANESTHESIA WITH IRRIGATION AND DRAINAGE OF PERIRECTAL ABSCESS;  Surgeon: Andria Meuse, MD;  Location: [facility];  Service: General;  Laterality: N/A;   FISTULOTOMY N/A 02/20/2020   Procedure: PARTIAL  FISTULOTOMY WITH PLACEMENT OF CUTTING TETON;  Surgeon: Andria Meuse, MD;  Location: [facility];  Service: General;  Laterality: N/A;   INCISION AND DRAINAGE PERIRECTAL ABSCESS N/A 06/16/2018   Procedure: Francia Greaves UNDER ANESTHESIA IRRIGATION AND DEBRIDEMENT PERIRECTAL ABSCESS;  Surgeon: Emelia Loron, MD;  Location: [facility];  Service: General;  Laterality: N/A;   KNEE ARTHROSCOPY     PLACEMENT OF SETON  08/08/2019   Procedure:  PLACEMENT OF DRAINING SETON;  Surgeon: Andria Meuse, MD;  Location: [facility];  Service: General;;   RECTAL EXAM UNDER ANESTHESIA N/A 02/20/2020   Procedure: ANORECTAL EXAM UNDER ANESTHESIA;  Surgeon: Andria Meuse, MD;  Location: [facility];  Service: General;  Laterality: N/A;       Family History  Problem Relation Age of Onset   Hypertension Mother    Hypertension Father     Social History   Tobacco Use   Smoking status: Every Day    Packs/day: 0.25    Years: 10.00    Pack years: 2.50    Types: Cigarettes   Smokeless tobacco: Never  Vaping Use   Vaping Use: Never used  Substance Use Topics   Alcohol use: Yes    Alcohol/week: 10.0 standard drinks    Types: 10 Cans of beer per week    Comment: 2 bottles liquor/ per wk   Drug use: Yes    Types: Marijuana    Comment: Pt reports smoking every other day (2g)    Home Medications Prior to Admission medications   Medication Sig Start Date End Date Taking? Authorizing Provider  albuterol (VENTOLIN HFA) 108 (90 Base) MCG/ACT inhaler Inhale 1-2 puffs into the lungs every 6 (six) hours as needed for wheezing or shortness of breath. 03/04/21  Yes Sabas Sous, MD  pantoprazole (PROTONIX) 20 MG tablet Take 1 tablet (20 mg total) by mouth daily. 07/12/21  Yes Harlene Salts A, PA-C  busPIRone (BUSPAR) 5 MG tablet Take 5-10 mg by mouth 3 (three) times daily as needed. panic Patient not  taking: Reported on 07/12/2021 05/28/21   [provider]  escitalopram (LEXAPRO) 10 MG tablet Take 10 mg by mouth daily. Patient not taking: Reported on 07/12/2021 05/28/21   [provider]  hydrOXYzine (ATARAX/VISTARIL) 25 MG tablet Take 1 tablet (25 mg total) by mouth every 6 (six) hours. Patient not taking: No sig reported 04/08/21   Cardama, Amadeo Garnet, MD    Allergies    Bee venom, Shellfish allergy, Compazine [prochlorperazine], and Other  Review of Systems   Review of Systems Ten systems are reviewed and are negative  for acute change except as noted in the HPI  Physical Exam Updated Vital Signs BP 139/86   Pulse 62   Temp 98.1 F (36.7 C) (Oral)   Resp 17   SpO2 98%   Physical Exam Constitutional:      General: He is not in acute distress.    Appearance: Normal appearance. He is well-developed. He is not ill-appearing or diaphoretic.  HENT:     Head: Normocephalic and atraumatic.  Eyes:     General: Vision grossly intact. Gaze aligned appropriately.     Pupils: Pupils are equal, round, and reactive to light.  Neck:     Trachea: Trachea and phonation normal.  Cardiovascular:     Rate and Rhythm: Normal rate and regular rhythm.     Pulses:          Radial pulses are 2+ on the right side and 2+ on the left side.       Dorsalis pedis pulses are 2+ on the right side and 2+ on the left side.     Heart sounds: Normal heart sounds.  Pulmonary:     Effort: Pulmonary effort is normal. No respiratory distress.     Breath sounds: Normal breath sounds.  Chest:     Chest wall: Tenderness present. No deformity or crepitus.  Abdominal:     General: There is no distension.     Palpations: Abdomen is soft.     Tenderness: There is no abdominal tenderness. There is no guarding or rebound. Negative signs include Murphy's sign.  Musculoskeletal:        General: Normal range of motion.     Cervical back: Normal range of motion.     Right lower leg: No tenderness. No edema.     Left lower leg: No tenderness. No edema.  Skin:    General: Skin is warm and dry.  Neurological:     Mental Status: He is alert.     GCS: GCS eye subscore is 4. GCS verbal subscore is 5. GCS motor subscore is 6.     Comments: Speech is clear and goal oriented, follows commands Major Cranial nerves without deficit, no facial droop Moves extremities without ataxia, coordination intact  Psychiatric:        Behavior: Behavior normal.    ED Results / Procedures / Treatments   Labs (all labs ordered are listed, but only abnormal  results are displayed) Labs Reviewed  BASIC METABOLIC PANEL - Abnormal; Notable for the following components:      Result Value   Potassium 3.4 (*)    Glucose, Bld 100 (*)    All other components within normal limits  CBC  TROPONIN I (HIGH SENSITIVITY)  TROPONIN I (HIGH SENSITIVITY)    EKG EKG Interpretation  Date/Time:  Sunday July 12 2021 06:55:04 EDT Ventricular Rate:  51 PR Interval:  178 QRS Duration: 99 QT Interval:  437 QTC Calculation: 403 R  Axis:   60 Text Interpretation: Sinus rhythm Early repolarization Confirmed by Gilda Crease [id number]) on 07/12/2021 6:58:28 AM  Radiology DG Chest 2 View  Result Date: 07/11/2021 CLINICAL DATA:  Worsening chronic chest pain. EXAM: CHEST - 2 VIEW COMPARISON:  June 07, 2021 FINDINGS: The heart size and mediastinal contours are within normal limits. No focal consolidation. No pleural effusion. No pneumothorax. The visualized skeletal structures are unremarkable. IMPRESSION: No active cardiopulmonary disease. Electronically Signed   By: Maudry Mayhew MD   On: 07/11/2021 23:41    Procedures Procedures   Medications Ordered in ED Medications  alum & mag hydroxide-simeth (MAALOX/MYLANTA) 200-200-20 MG/5ML suspension 30 mL (30 mLs Oral Given 07/12/21 0736)    And  lidocaine (XYLOCAINE) 2 % viscous mouth solution 15 mL (15 mLs Oral Given 07/12/21 0736)  ketorolac (TORADOL) 15 MG/ML injection 15 mg (15 mg Intramuscular Given 07/12/21 0859)    ED Course  I have reviewed the triage vital signs and the nursing notes.  Pertinent labs & imaging results that were available during my care of the patient were reviewed by me and considered in my medical decision making (see chart for details).    MDM Rules/Calculators/A&P                          Additional history obtained from: Nursing notes from this visit. Review of electronic medical records. ------------------- I ordered, reviewed and interpreted labs which include: CBC within  normal limits, no leukocytosis to suggest infectious process, no anemia. BMP shows mild hypokalemia at 3.4, no emergent electrolyte derangement, AKI or gap. Initial and delta high-sensitivity troponins within normal limits (4->4)  CXR:  IMPRESSION:  No active cardiopulmonary disease.   EKG: Sinus rhythm Early repolarization Confirmed by Gilda Crease [id number]) on 07/12/2021 6:58:28 AM - Work-up today is reassuring, vital signs are stable.  Suspect patient's chest pain may be secondary to musculoskeletal as it is reproducible on exam versus gastroesophageal reflux disease.  Patient currently does not take any antacid medications to help with his symptoms.  We will start patient on Protonix daily to help with his symptoms.  Encouraged to follow-up with his PCP.  Additionally patient has a scheduled appointment with cardiology next month which I encouraged him to maintain.  I believe suspicion for ACS, PE, dissection, pneumonia or other emergent cardiopulmonary etiologies patient's symptoms at this time.  At this time there does not appear to be any evidence of an acute emergency medical condition and the patient appears stable for discharge with appropriate outpatient follow up. Diagnosis was discussed with patient who verbalizes understanding of care plan and is agreeable to discharge. I have discussed return precautions with patient who verbalizes understanding. Patient encouraged to follow-up with their PCP. All questions answered.  Patient's case discussed with Dr. Jeraldine Loots who agrees with plan to discharge with follow-up.   Note: Portions of this report may have been transcribed using voice recognition software. Every effort was made to ensure accuracy; however, inadvertent computerized transcription errors may still be present.  Final Clinical Impression(s) / ED Diagnoses Final diagnoses:  Atypical chest pain    Rx / DC Orders ED Discharge Orders          Ordered    pantoprazole  (PROTONIX) 20 MG tablet  Daily        07/12/21 0947             Bill Salinas, PA-C 07/12/21 1520  Gerhard MunchLockwood, Robert, MD 07/13/21 [phone]

## 2021-07-29 ENCOUNTER — Ambulatory Visit (HOSPITAL_COMMUNITY)
Admission: EM | Admit: 2021-07-29 | Discharge: 2021-07-29 | Disposition: A | Discharge status: Home / Self Care | Payer: Self-pay | Attending: Emergency Medicine | Admitting: Emergency Medicine

## 2021-07-29 ENCOUNTER — Encounter (HOSPITAL_COMMUNITY): Payer: Self-pay | Admitting: *Deleted

## 2021-07-29 ENCOUNTER — Other Ambulatory Visit: Payer: Self-pay

## 2021-07-29 DIAGNOSIS — R0789 Other chest pain: Secondary | ICD-10-CM

## 2021-07-29 DIAGNOSIS — R0602 Shortness of breath: Secondary | ICD-10-CM

## 2021-07-29 DIAGNOSIS — Z20822 Contact with and (suspected) exposure to covid-19: Secondary | ICD-10-CM | POA: Insufficient documentation

## 2021-07-29 MED ORDER — PREDNISONE 20 MG PO TABS: 40.0000 mg | ORAL_TABLET | Freq: Every day | ORAL | 0 refills | Status: AC

## 2021-07-29 NOTE — Discharge Instructions (Addendum)
We will contact you if your COVID test is positive.  Please quarantine while you wait for the results.  If your test is negative you may resume normal activities.  If your test is positive please continue to quarantine for at least 5 days from your symptom onset or until you are without a fever for at least 24 hours after the medications.  Take the prednisone daily for the next 5 days.  Do not take any Advil/ibuprofen or Aleve/naproxen while taking the steroids.  You can take Tylenol as needed for pain.   You can apply heat, ice, or alternate between heat and ice for comfort.  You can use IcyHot or lidocaine patches for pain relief.    Drink plenty of fluids and rest.    Return or go to the Emergency Department if symptoms worsen or do not improve in the next few days.

## 2021-07-29 NOTE — ED Provider Notes (Signed)
[facility]    CSN: [id number] Arrival date & time: 07/29/21  1920      History   Chief Complaint Chief Complaint  Patient presents with   Shortness of Breath    HPI Victor Burns is a 30 y.o. male.   Patient here for evaluation of shortness of breath and left-sided chest pain.  Reports chest pain has been ongoing for a while and was recently evaluated in the emergency room for the same.  Reports shortness of breath just started recently.  Reports roommate is sick with likely COVID.  Has not taken any OTC medications or treatments.  Denies any trauma, injury, or other precipitating event.  Denies any specific alleviating or aggravating factors.  Denies any fevers, N/V/D, numbness, tingling, weakness, abdominal pain, or headaches.    The history is provided by the patient.  Shortness of Breath Associated symptoms: chest pain    Past Medical History:  Diagnosis Date   Abscess    Arthritis    knee, back   Asthma     Patient Active Problem List   Diagnosis Date Noted   Perirectal abscess s/p I&D 06/16/2018 06/16/2018   Alcohol abuse 06/16/2018   Mild persistent asthma 10/07/2017    Past Surgical History:  Procedure Laterality Date   EVALUATION UNDER ANESTHESIA WITH HEMORRHOIDECTOMY N/A 08/08/2019   Procedure: EXAM UNDER ANESTHESIA WITH IRRIGATION AND DRAINAGE OF PERIRECTAL ABSCESS;  Surgeon: Andria Meuse, MD;  Location: [facility];  Service: General;  Laterality: N/A;   FISTULOTOMY N/A 02/20/2020   Procedure: PARTIAL  FISTULOTOMY WITH PLACEMENT OF CUTTING TETON;  Surgeon: Andria Meuse, MD;  Location: [facility];  Service: General;  Laterality: N/A;   INCISION AND DRAINAGE PERIRECTAL ABSCESS N/A 06/16/2018   Procedure: Francia Greaves UNDER ANESTHESIA IRRIGATION AND DEBRIDEMENT PERIRECTAL ABSCESS;  Surgeon: Emelia Loron, MD;  Location: [facility];  Service: General;  Laterality: N/A;   KNEE ARTHROSCOPY     PLACEMENT OF SETON  08/08/2019   Procedure: PLACEMENT OF  DRAINING SETON;  Surgeon: Andria Meuse, MD;  Location: [facility];  Service: General;;   RECTAL EXAM UNDER ANESTHESIA N/A 02/20/2020   Procedure: ANORECTAL EXAM UNDER ANESTHESIA;  Surgeon: Andria Meuse, MD;  Location: [facility];  Service: General;  Laterality: N/A;       Home Medications    Prior to Admission medications   Medication Sig Start Date End Date Taking? Authorizing Provider  predniSONE (DELTASONE) 20 MG tablet Take 2 tablets (40 mg total) by mouth daily for 5 days. 07/29/21 08/03/21 Yes Ivette Loyal, NP  albuterol (VENTOLIN HFA) 108 (90 Base) MCG/ACT inhaler Inhale 1-2 puffs into the lungs every 6 (six) hours as needed for wheezing or shortness of breath. 03/04/21   Sabas Sous, MD  busPIRone (BUSPAR) 5 MG tablet Take 5-10 mg by mouth 3 (three) times daily as needed. panic Patient not taking: Reported on 07/12/2021 05/28/21   [provider]  escitalopram (LEXAPRO) 10 MG tablet Take 10 mg by mouth daily. Patient not taking: Reported on 07/12/2021 05/28/21   [provider]  hydrOXYzine (ATARAX/VISTARIL) 25 MG tablet Take 1 tablet (25 mg total) by mouth every 6 (six) hours. Patient not taking: No sig reported 04/08/21   Cardama, Amadeo Garnet, MD  pantoprazole (PROTONIX) 20 MG tablet Take 1 tablet (20 mg total) by mouth daily. 07/12/21   Bill Salinas, PA-C    Family History Family History  Problem Relation Age of Onset   Hypertension Mother  Hypertension Father     Social History Social History   Tobacco Use   Smoking status: Every Day    Packs/day: 0.25    Years: 10.00    Pack years: 2.50    Types: Cigarettes   Smokeless tobacco: Never  Vaping Use   Vaping Use: Never used  Substance Use Topics   Alcohol use: Yes    Alcohol/week: 10.0 standard drinks    Types: 10 Cans of beer per week    Comment: 2 bottles liquor/ per wk   Drug use: Yes    Types: Marijuana    Comment: Pt reports smoking every other day (2g)     Allergies    Bee venom, Shellfish allergy, Compazine [prochlorperazine], and Other   Review of Systems Review of Systems  Respiratory:  Positive for shortness of breath.   Cardiovascular:  Positive for chest pain.  All other systems reviewed and are negative.   Physical Exam Triage Vital Signs ED Triage Vitals  Enc Vitals Group     BP 07/29/21 1950 128/87     Pulse Rate 07/29/21 1950 (!) 59     Resp 07/29/21 1950 18     Temp 07/29/21 1950 98.7 F (37.1 C)     Temp src --      SpO2 07/29/21 1950 97 %     Weight --      Height --      Head Circumference --      Peak Flow --      Pain Score 07/29/21 1951 0     Pain Loc --      Pain Edu? --      Excl. in GC? --    No data found.  Updated Vital Signs BP 128/87   Pulse (!) 59   Temp 98.7 F (37.1 C)   Resp 18   SpO2 97%   Visual Acuity Right Eye Distance:   Left Eye Distance:   Bilateral Distance:    Right Eye Near:   Left Eye Near:    Bilateral Near:     Physical Exam Vitals and nursing note reviewed.  Constitutional:      General: He is not in acute distress.    Appearance: Normal appearance. He is not ill-appearing, toxic-appearing or diaphoretic.  HENT:     Head: Normocephalic and atraumatic.  Eyes:     Conjunctiva/sclera: Conjunctivae normal.  Cardiovascular:     Rate and Rhythm: Normal rate.     Pulses: Normal pulses.     Heart sounds: Normal heart sounds.  Pulmonary:     Effort: Pulmonary effort is normal.     Breath sounds: Normal breath sounds. No decreased breath sounds, wheezing, rhonchi or rales.  Chest:     Chest wall: Tenderness (left side/rib pain on palpation) present.  Abdominal:     General: Abdomen is flat.  Musculoskeletal:        General: Normal range of motion.     Cervical back: Normal range of motion.  Skin:    General: Skin is warm and dry.  Neurological:     General: No focal deficit present.     Mental Status: He is alert and oriented to person, place, and time.  Psychiatric:         Mood and Affect: Mood normal.     UC Treatments / Results  Labs (all labs ordered are listed, but only abnormal results are displayed) Labs Reviewed  SARS CORONAVIRUS 2 (TAT 6-24 HRS)  EKG   Radiology No results found.  Procedures Procedures (including critical care time)  Medications Ordered in UC Medications - No data to display  Initial Impression / Assessment and Plan / UC Course  I have reviewed the triage vital signs and the nursing notes.  Pertinent labs & imaging results that were available during my care of the patient were reviewed by me and considered in my medical decision making (see chart for details).    Assessment negative for red flags or concerns.  Atypical chest pain.  Chest pain is reproducible and therefore likely musculoskeletal in nature versus cardiac.  We will treat with prednisone daily for the next 5 days.  May also take Tylenol as needed.  Recommend heat, ice, or alternate between heat and ice.  Discussed conservative symptom management.  Shortness of breath.  COVID test pending.  Discussed current CDC guidelines for quarantine.  Discussed conservative symptom management.  Follow-up as needed. Final Clinical Impressions(s) / UC Diagnoses   Final diagnoses:  Atypical chest pain  Shortness of breath     Discharge Instructions      We will contact you if your COVID test is positive.  Please quarantine while you wait for the results.  If your test is negative you may resume normal activities.  If your test is positive please continue to quarantine for at least 5 days from your symptom onset or until you are without a fever for at least 24 hours after the medications.  Take the prednisone daily for the next 5 days.  Do not take any Advil/ibuprofen or Aleve/naproxen while taking the steroids.  You can take Tylenol as needed for pain.   You can apply heat, ice, or alternate between heat and ice for comfort.  You can use IcyHot or lidocaine  patches for pain relief.    Drink plenty of fluids and rest.    Return or go to the Emergency Department if symptoms worsen or do not improve in the next few days.      ED Prescriptions     Medication Sig Dispense Auth. Provider   predniSONE (DELTASONE) 20 MG tablet Take 2 tablets (40 mg total) by mouth daily for 5 days. 10 tablet Ivette Loyal, NP      PDMP not reviewed this encounter.   Ivette Loyal, NP 07/29/21 2026

## 2021-07-29 NOTE — ED Triage Notes (Signed)
Pt reports Shortness of breath and adry cough at night . Pt has a inhaler but has not used it for [facility].

## 2021-07-30 LAB — SARS CORONAVIRUS 2 (TAT 6-24 HRS): SARS Coronavirus 2: NEGATIVE

## 2021-08-18 NOTE — Progress Notes (Signed)
Cardiology Office Note:    Date:  08/19/2021   ID:  Rolena Infante, DOB 09/01/1991, MRN [id number]  PCP:  Pcp, No  Cardiologist:  None   Referring MD: No ref. provider found   Chief Complaint  Patient presents with   Chest Pain     History of Present Illness:    Jamarrion Budai is a 30 y.o. male with a hx of recurring chest pain leading to multiple ER visits (10 since 04/2020) Other medical problems are asthma and perirectal abscess.  Uses tobacco, weed, cocaine and alcohol, all intermittently.  7 or 8 months ago while carrying barbecue back into his house while intoxicated, he had a significant fall onto the left side of his chest and arm.  Said he felt okay immediately afterwards but starting the day after he noticed stiffness, soreness, and difficulty with his left arm.  No immediate medical attention was sought.  Since that time he has had recurring episodes of left axillary and left lateral chest, left arm, and occasional back discomfort.  The discomfort occurs randomly.  He is unable to sleep on his left side because it precipitates pain from time to time.  On occasion the discomfort is unbearable.  When it gets about level, he goes to the emergency room.  States that he "we did" will help him to feel better.  Past Medical History:  Diagnosis Date   Abscess    Arthritis    knee, back   Asthma     Past Surgical History:  Procedure Laterality Date   EVALUATION UNDER ANESTHESIA WITH HEMORRHOIDECTOMY N/A 08/08/2019   Procedure: EXAM UNDER ANESTHESIA WITH IRRIGATION AND DRAINAGE OF PERIRECTAL ABSCESS;  Surgeon: Andria Meuse, MD;  Location: [facility];  Service: General;  Laterality: N/A;   FISTULOTOMY N/A 02/20/2020   Procedure: PARTIAL  FISTULOTOMY WITH PLACEMENT OF CUTTING TETON;  Surgeon: Andria Meuse, MD;  Location: [facility];  Service: General;  Laterality: N/A;   INCISION AND DRAINAGE PERIRECTAL ABSCESS N/A 06/16/2018   Procedure: Francia Greaves UNDER ANESTHESIA IRRIGATION AND  DEBRIDEMENT PERIRECTAL ABSCESS;  Surgeon: Emelia Loron, MD;  Location: [facility];  Service: General;  Laterality: N/A;   KNEE ARTHROSCOPY     PLACEMENT OF SETON  08/08/2019   Procedure: PLACEMENT OF DRAINING SETON;  Surgeon: Andria Meuse, MD;  Location: [facility];  Service: General;;   RECTAL EXAM UNDER ANESTHESIA N/A 02/20/2020   Procedure: ANORECTAL EXAM UNDER ANESTHESIA;  Surgeon: Andria Meuse, MD;  Location: [facility];  Service: General;  Laterality: N/A;    Current Medications: Current Meds  Medication Sig   albuterol (VENTOLIN HFA) 108 (90 Base) MCG/ACT inhaler Inhale 1-2 puffs into the lungs every 6 (six) hours as needed for wheezing or shortness of breath.   busPIRone (BUSPAR) 5 MG tablet Take 5-10 mg by mouth 3 (three) times daily as needed. panic   escitalopram (LEXAPRO) 10 MG tablet Take 10 mg by mouth daily.   hydrOXYzine (ATARAX/VISTARIL) 25 MG tablet Take 1 tablet (25 mg total) by mouth every 6 (six) hours.   pantoprazole (PROTONIX) 20 MG tablet Take 1 tablet (20 mg total) by mouth daily.     Allergies:   Bee venom, Shellfish allergy, Compazine [prochlorperazine], and Other   Social History   Socioeconomic History   Marital status: Single    Spouse name: Not on file   Number of children: Not on file   Years of education: Not on file   Highest education level: Not on file  Occupational History   Not on file  Tobacco Use   Smoking status: Every Day    Packs/day: 0.25    Years: 10.00    Pack years: 2.50    Types: Cigarettes   Smokeless tobacco: Never  Vaping Use   Vaping Use: Never used  Substance and Sexual Activity   Alcohol use: Yes    Alcohol/week: 10.0 standard drinks    Types: 10 Cans of beer per week    Comment: 2 bottles liquor/ per wk   Drug use: Yes    Types: Marijuana    Comment: Pt reports smoking every other day (2g)   Sexual activity: Not on file  Other Topics Concern   Not on file  Social History Narrative   ** Merged History  Encounter **       Social Determinants of Health   Financial Resource Strain: Not on file  Food Insecurity: Not on file  Transportation Needs: Not on file  Physical Activity: Not on file  Stress: Not on file  Social Connections: Not on file     Family History: The patient's family history includes Hypertension in his father and mother.  ROS:   Please see the history of present illness.    He has a boyfriend.  He does not smoke every day.  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  His job is sedentary.  He does not exercise regularly.  All other systems reviewed and are negative.  EKGs/Labs/Other Studies Reviewed:    The following studies were reviewed today: No cardiac evaluation to this point  EKG:  EKG 07/12/2021 reveals SB with early repolarization (most likely) or pericarditis.  No change from EKG done 2 days prior.  Recent Labs: 04/10/2021: ALT 23; TSH 1.689 07/11/2021: BUN 17; Creatinine, Ser 1.06; Hemoglobin 13.4; Platelets 339; Potassium 3.4; Sodium 136  Recent Lipid Panel No results found for: CHOL, TRIG, HDL, CHOLHDL, VLDL, LDLCALC, LDLDIRECT  Physical Exam:    VS:  BP 124/80   Pulse 60   Ht 5\' 11"  (1.803 m)   Wt 250 lb 3.2 oz (113.5 kg)   SpO2 96%   BMI 34.90 kg/m     Wt Readings from Last 3 Encounters:  08/19/21 250 lb 3.2 oz (113.5 kg)  06/07/21 252 lb (114.3 kg)  04/10/21 240 lb (108.9 kg)     GEN: Overweight. No acute distress HEENT: Normal NECK: No JVD. LYMPHATICS: No lymphadenopathy CARDIAC: No murmur. RRR no gallop, or edema. VASCULAR:  Normal Pulses. No bruits. RESPIRATORY:  Clear to auscultation without rales, wheezing or rhonchi  ABDOMEN: Soft, non-tender, non-distended, No pulsatile mass, MUSCULOSKELETAL: No deformity  SKIN: Warm and dry NEUROLOGIC:  Alert and oriented x 3 PSYCHIATRIC:  Normal affect   ASSESSMENT:    1. Chest pain of uncertain etiology    PLAN:    In order of problems listed above:  Needs 2 D  doppler echo.  We will also perform an exercise treadmill test.  Chest discomfort is likely musculoskeletal but given the repeated exposure to emergency room evaluation having some data will be helpful.  I discussed this with the patient in detail.  Not sure he has mentioned that the entire situation with chest pain started after the fall onto a brick staircase while intoxicated.  May ultimately need CT or MRI of thoracic spine to exclude the possibility of microfracture or bulging disc.   As needed follow-up based upon findings.   Medication Adjustments/Labs and Tests Ordered: Current  medicines are reviewed at length with the patient today.  Concerns regarding medicines are outlined above.  Orders Placed This Encounter  Procedures   Cardiac Stress Test: Informed Consent Details: Physician/Practitioner Attestation; Transcribe to consent form and obtain patient signature   Exercise Tolerance Test   EKG 12-Lead   ECHOCARDIOGRAM COMPLETE    No orders of the defined types were placed in this encounter.   Patient Instructions  Medication Instructions:  Your physician recommends that you continue on your current medications as directed. Please refer to the Current Medication list given to you today.  *If you need a refill on your cardiac medications before your next appointment, please call your pharmacy*   Lab Work: None If you have labs (blood work) drawn today and your tests are completely normal, you will receive your results only by: MyChart Message (if you have MyChart) OR A paper copy in the mail If you have any lab test that is abnormal or we need to change your treatment, we will call you to review the results.   Testing/Procedures: Your physician has requested that you have an echocardiogram. Echocardiography is a painless test that uses sound waves to create images of your heart. It provides your doctor with information about the size and shape of your heart and how well your  heart's chambers and valves are working. This procedure takes approximately one hour. There are no restrictions for this procedure.  Your physician has requested that you have an exercise tolerance test. For further information please visit [email or web address] Please also follow instruction sheet, as given.   Follow-Up: At [facility], you and your health needs are our priority.  As part of our continuing mission to provide you with exceptional heart care, we have created designated Provider Care Teams.  These Care Teams include your primary Cardiologist (physician) and Advanced Practice Providers (APPs -  Physician Assistants and Nurse Practitioners) who all work together to provide you with the care you need, when you need it.  We recommend signing up for the patient portal called "MyChart".  Sign up information is provided on this After Visit Summary.  MyChart is used to connect with patients for Virtual Visits (Telemedicine).  Patients are able to view lab/test results, encounter notes, upcoming appointments, etc.  Non-urgent messages can be sent to your provider as well.   To learn more about what you can do with MyChart, go to [email or web address].    Your next appointment:   As needed  The format for your next appointment:   In Person  Provider:   You may see Verdis Prime, III, MD or one of the following Advanced Practice Providers on your designated Care Team:   Nada Boozer, NP   Other Instructions     Signed, Lesleigh Noe, MD  08/19/2021 1:04 PM    [facility]

## 2021-08-19 ENCOUNTER — Other Ambulatory Visit: Payer: Self-pay

## 2021-08-19 ENCOUNTER — Ambulatory Visit (INDEPENDENT_AMBULATORY_CARE_PROVIDER_SITE_OTHER): Payer: Commercial Managed Care - PPO | Admitting: Interventional Cardiology

## 2021-08-19 ENCOUNTER — Encounter: Payer: Self-pay | Admitting: Interventional Cardiology

## 2021-08-19 VITALS — BP 124/80 | HR 60 | Ht 71.0 in | Wt 250.2 lb

## 2021-08-19 DIAGNOSIS — R079 Chest pain, unspecified: Secondary | ICD-10-CM | POA: Diagnosis not present

## 2021-08-19 NOTE — Patient Instructions (Signed)
Medication Instructions:  Your physician recommends that you continue on your current medications as directed. Please refer to the Current Medication list given to you today.  *If you need a refill on your cardiac medications before your next appointment, please call your pharmacy*   Lab Work: None If you have labs (blood work) drawn today and your tests are completely normal, you will receive your results only by: MyChart Message (if you have MyChart) OR A paper copy in the mail If you have any lab test that is abnormal or we need to change your treatment, we will call you to review the results.   Testing/Procedures: Your physician has requested that you have an echocardiogram. Echocardiography is a painless test that uses sound waves to create images of your heart. It provides your doctor with information about the size and shape of your heart and how well your heart's chambers and valves are working. This procedure takes approximately one hour. There are no restrictions for this procedure.  Your physician has requested that you have an exercise tolerance test. For further information please visit [email or web address] Please also follow instruction sheet, as given.   Follow-Up: At [facility], you and your health needs are our priority.  As part of our continuing mission to provide you with exceptional heart care, we have created designated Provider Care Teams.  These Care Teams include your primary Cardiologist (physician) and Advanced Practice Providers (APPs -  Physician Assistants and Nurse Practitioners) who all work together to provide you with the care you need, when you need it.  We recommend signing up for the patient portal called "MyChart".  Sign up information is provided on this After Visit Summary.  MyChart is used to connect with patients for Virtual Visits (Telemedicine).  Patients are able to view lab/test results, encounter notes, upcoming appointments, etc.  Non-urgent  messages can be sent to your provider as well.   To learn more about what you can do with MyChart, go to [email or web address].    Your next appointment:   As needed  The format for your next appointment:   In Person  Provider:   You may see Verdis Prime, III, MD or one of the following Advanced Practice Providers on your designated Care Team:   Nada Boozer, NP   Other Instructions

## 2021-09-10 ENCOUNTER — Encounter (HOSPITAL_COMMUNITY): Payer: Self-pay | Admitting: Interventional Cardiology

## 2021-09-10 ENCOUNTER — Ambulatory Visit (HOSPITAL_COMMUNITY): Payer: Commercial Managed Care - PPO | Attending: Cardiology

## 2021-09-10 ENCOUNTER — Encounter (HOSPITAL_COMMUNITY): Payer: Self-pay

## 2021-09-12 ENCOUNTER — Emergency Department (HOSPITAL_COMMUNITY): Payer: Commercial Managed Care - PPO

## 2021-09-12 ENCOUNTER — Emergency Department (HOSPITAL_COMMUNITY)
Admission: EM | Admit: 2021-09-12 | Discharge: 2021-09-13 | Disposition: A | Discharge status: Home / Self Care | Payer: Commercial Managed Care - PPO | Attending: Emergency Medicine | Admitting: Emergency Medicine

## 2021-09-12 ENCOUNTER — Ambulatory Visit (HOSPITAL_COMMUNITY)
Admission: EM | Admit: 2021-09-12 | Discharge: 2021-09-12 | Disposition: A | Discharge status: Other Acute Inpatient Hospital | Payer: Commercial Managed Care - PPO | Attending: Internal Medicine | Admitting: Internal Medicine

## 2021-09-12 ENCOUNTER — Other Ambulatory Visit: Payer: Self-pay

## 2021-09-12 ENCOUNTER — Encounter (HOSPITAL_COMMUNITY): Payer: Self-pay | Admitting: Emergency Medicine

## 2021-09-12 ENCOUNTER — Encounter (HOSPITAL_COMMUNITY): Payer: Self-pay | Admitting: *Deleted

## 2021-09-12 DIAGNOSIS — R079 Chest pain, unspecified: Secondary | ICD-10-CM

## 2021-09-12 DIAGNOSIS — R0789 Other chest pain: Secondary | ICD-10-CM | POA: Diagnosis not present

## 2021-09-12 DIAGNOSIS — R202 Paresthesia of skin: Secondary | ICD-10-CM

## 2021-09-12 DIAGNOSIS — R200 Anesthesia of skin: Secondary | ICD-10-CM

## 2021-09-12 DIAGNOSIS — J4530 Mild persistent asthma, uncomplicated: Secondary | ICD-10-CM | POA: Diagnosis not present

## 2021-09-12 DIAGNOSIS — F17210 Nicotine dependence, cigarettes, uncomplicated: Secondary | ICD-10-CM | POA: Diagnosis not present

## 2021-09-12 LAB — TROPONIN I (HIGH SENSITIVITY)
Troponin I (High Sensitivity): 7 ng/L (ref <18)
Troponin I (High Sensitivity): 6 ng/L (ref <18)

## 2021-09-12 LAB — BASIC METABOLIC PANEL
Anion gap: 8 (ref 5–15)
BUN: 13 mg/dL (ref 6–20)
CO2: 25 mmol/L (ref 22–32)
Calcium: 9.5 mg/dL (ref 8.9–10.3)
Chloride: 105 mmol/L (ref 98–111)
Creatinine, Ser: 1.07 mg/dL (ref 0.61–1.24)
GFR, Estimated: >60 mL/min (ref >60)
Glucose, Bld: 95 mg/dL (ref 70–99)
Potassium: 4.1 mmol/L (ref 3.5–5.1)
Sodium: 138 mmol/L (ref 135–145)

## 2021-09-12 LAB — CBC WITH DIFFERENTIAL/PLATELET
Abs Immature Granulocytes: 0 10*3/uL (ref 0.00–0.07)
Basophils Absolute: 0 10*3/uL (ref 0.0–0.1)
Basophils Relative: 1 %
Eosinophils Absolute: 0 10*3/uL (ref 0.0–0.5)
Eosinophils Relative: 1 %
HCT: 41.1 % (ref 39.0–52.0)
Hemoglobin: 13.6 g/dL (ref 13.0–17.0)
Immature Granulocytes: 0 %
Lymphocytes Relative: 34 %
Lymphs Abs: 1.5 10*3/uL (ref 0.7–4.0)
MCH: 29.7 pg (ref 26.0–34.0)
MCHC: 33.1 g/dL (ref 30.0–36.0)
MCV: 89.7 fL (ref 80.0–100.0)
Monocytes Absolute: 0.4 10*3/uL (ref 0.1–1.0)
Monocytes Relative: 10 %
Neutro Abs: 2.4 10*3/uL (ref 1.7–7.7)
Neutrophils Relative %: 54 %
Platelets: 323 10*3/uL (ref 150–400)
RBC: 4.58 MIL/uL (ref 4.22–5.81)
RDW: 13.2 % (ref 11.5–15.5)
WBC: 4.3 10*3/uL (ref 4.0–10.5)
nRBC: 0 % (ref 0.0–0.2)

## 2021-09-12 NOTE — ED Provider Notes (Signed)
Emergency Medicine Provider Triage Evaluation Note  Victor Burns , Burns 30 y.o. male  was evaluated in triage.  Pt complains of chest pain and left arm numbness.  Has had intermittent chest pain, left-sided for greater than 6 months.  Has had multiple ED visits for similar.  Was seen by Dr. Duke Salvia with cardiology, scheduled for outpatient echo.  Chest pain today feels different than normal goes into left axilla.,  Nonexertional and nonpleuritic in nature.  He is also had Burns headache for 2 days and left arm numbness.  States he has intermittent weakness to his left hand.  No sudden onset thunderclap headache, recent traumatic injuries. Admits to intermittent cocaine use. No back pain, LE swelling  Review of Systems  Positive: HA, numbness, CP Negative: Fever, back pain, sob  Physical Exam  There were no vitals taken for this visit. Gen:   Awake, no distress   Resp:  Normal effort  MSK:   Moves extremities without difficulty, Tenderness to left anterior lateral chest wall without overlying skin changes. Neuro:  CN 2-12 grossly intact, Intact sensation. Mild decrease in hand grip to left. No drift. Ambulatory without ataxic gait Other:    Medical Decision Making  Medically screening exam initiated at 5:36 PM.  Appropriate orders placed.  Victor Burns was informed that the remainder of the evaluation will be completed by another provider, this initial triage assessment does not replace that evaluation, and the importance of remaining in the ED until their evaluation is complete.  CP, numbness, HA   Victor Hinesley A, PA-C 09/12/21 1736    Victor Bale, MD 09/13/21 0930

## 2021-09-12 NOTE — ED Provider Notes (Signed)
[facility]    CSN: [id number] Arrival date & time: 09/12/21  1612      History   Chief Complaint Chief Complaint  Patient presents with   arm numbness    LT   Chest Pain    HPI Victor Burns is a 30 y.o. male.   Patient here today for evaluation of left sided chest pain that has been intermittent since the beginning of the year. He has been seen by cardiology who most recently recommended ECHO, stress test, etc. Today patient is here because he is also experiencing some left arm numbness. This is a new symptom.   The history is provided by the patient.  Chest Pain Associated symptoms: numbness   Associated symptoms: no fever and no shortness of breath    Past Medical History:  Diagnosis Date   Abscess    Arthritis    knee, back   Asthma     Patient Active Problem List   Diagnosis Date Noted   Perirectal abscess s/p I&D 06/16/2018 06/16/2018   Alcohol abuse 06/16/2018   Mild persistent asthma 10/07/2017    Past Surgical History:  Procedure Laterality Date   EVALUATION UNDER ANESTHESIA WITH HEMORRHOIDECTOMY N/A 08/08/2019   Procedure: EXAM UNDER ANESTHESIA WITH IRRIGATION AND DRAINAGE OF PERIRECTAL ABSCESS;  Surgeon: Andria Meuse, MD;  Location: [facility];  Service: General;  Laterality: N/A;   FISTULOTOMY N/A 02/20/2020   Procedure: PARTIAL  FISTULOTOMY WITH PLACEMENT OF CUTTING TETON;  Surgeon: Andria Meuse, MD;  Location: [facility];  Service: General;  Laterality: N/A;   INCISION AND DRAINAGE PERIRECTAL ABSCESS N/A 06/16/2018   Procedure: Francia Greaves UNDER ANESTHESIA IRRIGATION AND DEBRIDEMENT PERIRECTAL ABSCESS;  Surgeon: Emelia Loron, MD;  Location: [facility];  Service: General;  Laterality: N/A;   KNEE ARTHROSCOPY     PLACEMENT OF SETON  08/08/2019   Procedure: PLACEMENT OF DRAINING SETON;  Surgeon: Andria Meuse, MD;  Location: [facility];  Service: General;;   RECTAL EXAM UNDER ANESTHESIA N/A 02/20/2020   Procedure: ANORECTAL EXAM UNDER  ANESTHESIA;  Surgeon: Andria Meuse, MD;  Location: [facility];  Service: General;  Laterality: N/A;       Home Medications    Prior to Admission medications   Medication Sig Start Date End Date Taking? Authorizing Provider  albuterol (VENTOLIN HFA) 108 (90 Base) MCG/ACT inhaler Inhale 1-2 puffs into the lungs every 6 (six) hours as needed for wheezing or shortness of breath. 03/04/21   Sabas Sous, MD  busPIRone (BUSPAR) 5 MG tablet Take 5-10 mg by mouth 3 (three) times daily as needed. panic 05/28/21   [provider]  escitalopram (LEXAPRO) 10 MG tablet Take 10 mg by mouth daily. 05/28/21   [provider]  hydrOXYzine (ATARAX/VISTARIL) 25 MG tablet Take 1 tablet (25 mg total) by mouth every 6 (six) hours. 04/08/21   Nira Conn, MD  pantoprazole (PROTONIX) 20 MG tablet Take 1 tablet (20 mg total) by mouth daily. 07/12/21   Bill Salinas, PA-C    Family History Family History  Problem Relation Age of Onset   Hypertension Mother    Hypertension Father     Social History Social History   Tobacco Use   Smoking status: Every Day    Packs/day: 0.25    Years: 10.00    Pack years: 2.50    Types: Cigarettes   Smokeless tobacco: Never  Vaping Use   Vaping Use: Never used  Substance Use Topics  Alcohol use: Yes    Alcohol/week: 10.0 standard drinks    Types: 10 Cans of beer per week    Comment: 2 bottles liquor/ per wk   Drug use: Yes    Types: Marijuana    Comment: Pt reports smoking every other day (2g)     Allergies   Bee venom, Shellfish allergy, Compazine [prochlorperazine], and Other   Review of Systems Review of Systems  Constitutional:  Negative for fever.  Eyes:  Negative for discharge and redness.  Respiratory:  Negative for shortness of breath.   Cardiovascular:  Positive for chest pain.  Neurological:  Positive for numbness.    Physical Exam Triage Vital Signs ED Triage Vitals  Enc Vitals Group     BP 09/12/21  1624 139/81     Pulse Rate 09/12/21 1624 66     Resp 09/12/21 1624 18     Temp 09/12/21 1624 98.6 F (37 C)     Temp src --      SpO2 09/12/21 1624 97 %     Weight --      Height --      Head Circumference --      Peak Flow --      Pain Score 09/12/21 1619 7     Pain Loc --      Pain Edu? --      Excl. in [place]? --    No data found.  Updated Vital Signs BP 139/81   Pulse 66   Temp 98.6 F (37 C)   Resp 18   SpO2 97%     Physical Exam Vitals and nursing note reviewed.  Constitutional:      Appearance: He is well-developed. He is not ill-appearing or diaphoretic.  HENT:     Head: Normocephalic and atraumatic.  Eyes:     Conjunctiva/sclera: Conjunctivae normal.  Cardiovascular:     Rate and Rhythm: Normal rate.  Pulmonary:     Effort: Pulmonary effort is normal.  Neurological:     Mental Status: He is alert.  Psychiatric:        Mood and Affect: Mood normal.        Behavior: Behavior normal.     UC Treatments / Results  Labs (all labs ordered are listed, but only abnormal results are displayed) Labs Reviewed - No data to display  EKG   Radiology No results found.  Procedures Procedures (including critical care time)  Medications Ordered in UC Medications - No data to display  Initial Impression / Assessment and Plan / UC Course  I have reviewed the triage vital signs and the nursing notes.  Pertinent labs & imaging results that were available during my care of the patient were reviewed by me and considered in my medical decision making (see chart for details).   Patient appears stable and vitals are within normal limits however given new onset of left sided arm numbness in the setting of unknown etiology recurrent chest pain recommended further evaluation in the ED. Patient agrees with plan. I do feel he is stable to transport himself next door.   Final Clinical Impressions(s) / UC Diagnoses   Final diagnoses:  Chest pain, unspecified type  Left  arm numbness   Discharge Instructions   None    ED Prescriptions   None    PDMP not reviewed this encounter.   Tomi Bamberger, PA-C 09/12/21 [phone]

## 2021-09-12 NOTE — ED Triage Notes (Signed)
Pt reports intermittent chest pain since March.  ECHO scheduled for 10/24.  Reports L lateral chest pain, head throbbing, and L arm numbness x 2 days.

## 2021-09-12 NOTE — ED Triage Notes (Signed)
Pt has had CP since the beginning  of the year . Pt cardiologist Verdis Prime . For past 2 days Pt has had numbness to Lt arm.

## 2021-09-13 MED ORDER — ACETAMINOPHEN 325 MG PO TABS: 650.0000 mg | ORAL_TABLET | Freq: Once | ORAL | Status: DC

## 2021-09-13 MED ORDER — LIDOCAINE VISCOUS HCL 2 % MT SOLN
15.0000 mL | Freq: Once | OROMUCOSAL | Status: AC
  Administered 2021-09-13: 15 mL via ORAL
  Filled 2021-09-13: qty 15

## 2021-09-13 MED ORDER — ALUM & MAG HYDROXIDE-SIMETH 200-200-20 MG/5ML PO SUSP
30.0000 mL | Freq: Once | ORAL | Status: AC
  Administered 2021-09-13: 30 mL via ORAL
  Filled 2021-09-13: qty 30

## 2021-09-13 NOTE — ED Provider Notes (Addendum)
[facility] EMERGENCY DEPARTMENT Provider Note  CSN: [id number] Arrival date & time: 09/12/21 1714  Chief Complaint(s) Chest Pain  HPI Victor Burns is a 30 y.o. male here for several months of intermittent left-sided chest pain described as stabbing.  No alleviating or aggravating factors.  Today's episode was associated with left-sided upper extremity numbness and tingling which is also been intermittent for several months.  The numbness and tingling has completely subsided.  He denies any focal weakness.  He endorses a left-sided headache which is also resolved.  No associated shortness of breath, nausea, vomiting.  No recent fevers or infections.  No coughing or congestion.  Patient admits to binge drinking, THC, and cocaine use.  Last used cocaine earlier today.    Chest Pain  Past Medical History Past Medical History:  Diagnosis Date   Abscess    Arthritis    knee, back   Asthma    Patient Active Problem List   Diagnosis Date Noted   Perirectal abscess s/p I&D 06/16/2018 06/16/2018   Alcohol abuse 06/16/2018   Mild persistent asthma 10/07/2017   Home Medication(s) Prior to Admission medications   Medication Sig Start Date End Date Taking? Authorizing Provider  albuterol (VENTOLIN HFA) 108 (90 Base) MCG/ACT inhaler Inhale 1-2 puffs into the lungs every 6 (six) hours as needed for wheezing or shortness of breath. 03/04/21   Sabas Sous, MD  busPIRone (BUSPAR) 5 MG tablet Take 5-10 mg by mouth 3 (three) times daily as needed. panic 05/28/21   [provider]  escitalopram (LEXAPRO) 10 MG tablet Take 10 mg by mouth daily. 05/28/21   [provider]  hydrOXYzine (ATARAX/VISTARIL) 25 MG tablet Take 1 tablet (25 mg total) by mouth every 6 (six) hours. 04/08/21   Nira Conn, MD  pantoprazole (PROTONIX) 20 MG tablet Take 1 tablet (20 mg total) by mouth daily. 07/12/21   Elizabeth Palau                                                                                                                                     Past Surgical History Past Surgical History:  Procedure Laterality Date   EVALUATION UNDER ANESTHESIA WITH HEMORRHOIDECTOMY N/A 08/08/2019   Procedure: EXAM UNDER ANESTHESIA WITH IRRIGATION AND DRAINAGE OF PERIRECTAL ABSCESS;  Surgeon: Andria Meuse, MD;  [facility]: [facility];  Service: General;  Laterality: N/A;   FISTULOTOMY N/A 02/20/2020   Procedure: PARTIAL  FISTULOTOMY WITH PLACEMENT OF CUTTING TETON;  Surgeon: Andria Meuse, MD;  [facility];  Service: General;  Laterality: N/A;   INCISION AND DRAINAGE PERIRECTAL ABSCESS N/A 06/16/2018   Procedure: Francia Greaves UNDER ANESTHESIA IRRIGATION AND DEBRIDEMENT PERIRECTAL ABSCESS;  Surgeon: Emelia Loron, MD;  Location: [facility];  Service: General;  Laterality: N/A;   KNEE ARTHROSCOPY     PLACEMENT OF SETON  08/08/2019   Procedure: PLACEMENT OF DRAINING SETON;  Surgeon:  Andria Meuse, MD;  [facility];  Service: General;;   RECTAL EXAM UNDER ANESTHESIA N/A 02/20/2020   Procedure: ANORECTAL EXAM UNDER ANESTHESIA;  Surgeon: Andria Meuse, MD;  Location: [facility];  Service: General;  Laterality: N/A;   Family History Family History  Problem Relation Age of Onset   Hypertension Mother    Hypertension Father     Social History Social History   Tobacco Use   Smoking status: Every Day    Packs/day: 0.25    Years: 10.00    Pack years: 2.50    Types: Cigarettes   Smokeless tobacco: Never  Vaping Use   Vaping Use: Never used  Substance Use Topics   Alcohol use: Yes    Alcohol/week: 10.0 standard drinks    Types: 10 Cans of beer per week    Comment: 2 bottles liquor/ per wk   Drug use: Yes    Types: Marijuana, Cocaine    Comment: Pt reports smoking every other day (2g)   Allergies Bee venom, Shellfish allergy, Compazine [prochlorperazine], and Other  Review of Systems Review of Systems  Cardiovascular:  Positive for chest  pain.  All other systems are reviewed and are negative for acute change except as noted in the HPI  Physical Exam Vital Signs  I have reviewed the triage vital signs BP 128/82 (BP Location: Right Arm)   Pulse (!) 56   Temp 98.4 F (36.9 C) (Oral)   Resp 17   SpO2 100%   Physical Exam Vitals reviewed.  Constitutional:      General: He is not in acute distress.    Appearance: He is well-developed. He is not diaphoretic.  HENT:     Head: Normocephalic and atraumatic.     Nose: Nose normal.  Eyes:     General: No scleral icterus.       Right eye: No discharge.        Left eye: No discharge.     Conjunctiva/sclera: Conjunctivae normal.     Pupils: Pupils are equal, round, and reactive to light.  Cardiovascular:     Rate and Rhythm: Normal rate and regular rhythm.     Heart sounds: No murmur heard.   No friction rub. No gallop.  Pulmonary:     Effort: Pulmonary effort is normal. No respiratory distress.     Breath sounds: Normal breath sounds. No stridor. No rales.  Abdominal:     General: There is no distension.     Palpations: Abdomen is soft.     Tenderness: There is no abdominal tenderness.  Musculoskeletal:        General: No tenderness.     Cervical back: Normal range of motion and neck supple.  Skin:    General: Skin is warm and dry.     Findings: No erythema or rash.  Neurological:     Mental Status: He is alert and oriented to person, place, and time.    ED Results and Treatments Labs (all labs ordered are listed, but only abnormal results are displayed) Labs Reviewed  CBC WITH DIFFERENTIAL/PLATELET  BASIC METABOLIC PANEL  TROPONIN I (HIGH SENSITIVITY)  TROPONIN I (HIGH SENSITIVITY)  EKG  EKG Interpretation  Date/Time:  Saturday September 12 2021 20:18:30 EDT Ventricular Rate:  63 PR Interval:  176 QRS Duration: 88 QT  Interval:  402 QTC Calculation: 411 R Axis:   51 Text Interpretation: Normal sinus rhythm Early repolarization Normal ECG No acute changes Confirmed by Drema Pry [phone]) on 09/13/2021 4:44:42 AM       Radiology DG Chest 2 View  Result Date: 09/12/2021 CLINICAL DATA:  Chest pain, shortness of breath, cough EXAM: CHEST - 2 VIEW COMPARISON:  07/11/2021 FINDINGS: The heart size and mediastinal contours are within normal limits. Both lungs are clear. The visualized skeletal structures are unremarkable. IMPRESSION: No active cardiopulmonary disease. Electronically Signed   By: Burman Nieves M.D.   On: 09/12/2021 19:10   CT HEAD WO CONTRAST ( )  Result Date: 09/12/2021 CLINICAL DATA:  Left arm numbness, headache for 2 days EXAM: CT HEAD WITHOUT CONTRAST TECHNIQUE: Contiguous axial images were obtained from the base of the skull through the vertex without intravenous contrast. COMPARISON:  04/10/2021 FINDINGS: Brain: No acute infarct or hemorrhage. Lateral ventricles and midline structures are unremarkable. No acute extra-axial fluid collections. No mass effect. Vascular: No hyperdense vessel or unexpected calcification. Skull: Normal. Negative for fracture or focal lesion. Sinuses/Orbits: No acute finding. Other: None. IMPRESSION: 1. No acute intracranial process. Electronically Signed   By: Sharlet Salina M.D.   On: 09/12/2021 19:01    Pertinent labs & imaging results that were available during my care of the patient were reviewed by me and considered in my medical decision making (see MDM for details).  Medications Ordered in ED Medications  alum & mag hydroxide-simeth (MAALOX/MYLANTA) 200-200-20 MG/5ML suspension 30 mL (30 mLs Oral Given 09/13/21 0552)    And  lidocaine (XYLOCAINE) 2 % viscous mouth solution 15 mL (15 mLs Oral Given 09/13/21 0552)                                                                                                                                      Procedures Procedures  (including critical care time)  Medical Decision Making / ED Course I have reviewed the nursing notes for this encounter and the patient's prior records (if available in EHR or on provided paperwork).  Tarus Briski was evaluated in Emergency Department on 09/13/2021 for the symptoms described in the history of present illness. He was evaluated in the context of the global COVID-19 pandemic, which necessitated consideration that the patient might be at risk for infection with the SARS-CoV-2 virus that causes COVID-19. Institutional protocols and algorithms that pertain to the evaluation of patients at risk for COVID-19 are in a state of rapid change based on information released by regulatory bodies including the CDC and federal and state organizations. These policies and algorithms were followed during the patient's care in the ED.     Atypical chest pain. EKG without acute ischemic changes or evidence of pericarditis.  Heart score less than 3. Serial troponins negative x2.  Rules out ACS.  Low suspicion for pulmonary embolism. Presentation not classic for aortic dissection or esophageal perforation.  Chest x-ray without evidence suggestive of pneumonia, pneumothorax, pneumomediastinum.  No abnormal contour of the mediastinum to suggest dissection. No evidence of acute injuries.   Pertinent labs & imaging results that were available during my care of the patient were reviewed by me and considered in my medical decision making:  Rested labs reassuring without leukocytosis or anemia.  No significant electrolyte derangements or renal sufficiency.  Patient counseled on illicit drug use cessation.  Reports that he is scheduled for cardiac evaluation for stress testing.  During the MSE process patient had CT head ordered which was unremarkable.  No evidence of ICH.  I have low suspicion that the patient's symptoms are related to CVA.  Final Clinical Impression(s) /  ED Diagnoses Final diagnoses:  Atypical chest pain  Arm paresthesia, left   The patient appears reasonably screened and/or stabilized for discharge and I doubt any other medical condition or other [facility] requiring further screening, evaluation, or treatment in the ED at this time prior to discharge. Safe for discharge with strict return precautions.  Disposition: Discharge  Condition: Good  I have discussed the results, Dx and Tx plan with the patient/family who expressed understanding and agree(s) with the plan. Discharge instructions discussed at length. The patient/family was given strict return precautions who verbalized understanding of the instructions. No further questions at time of discharge.    ED Discharge Orders     None        Follow Up: Primary care provider  Schedule an appointment as soon as possible for a visit  if you do not have a primary care physician, contact [facility] at [phone] for referral     This chart was dictated using voice recognition software.  Despite best efforts to proofread,  errors can occur which can change the documentation meaning.      Nira Conn, MD 09/13/21 [phone]

## 2021-09-29 ENCOUNTER — Ambulatory Visit (HOSPITAL_COMMUNITY): Payer: Commercial Managed Care - PPO | Attending: Internal Medicine

## 2021-09-29 ENCOUNTER — Other Ambulatory Visit: Payer: Self-pay

## 2021-09-29 ENCOUNTER — Ambulatory Visit: Payer: Commercial Managed Care - PPO

## 2021-09-29 DIAGNOSIS — R079 Chest pain, unspecified: Secondary | ICD-10-CM | POA: Diagnosis not present

## 2021-09-29 LAB — ECHOCARDIOGRAM COMPLETE
Area-P 1/2: 3.21 cm2
S' Lateral: 3.1 cm

## 2021-10-01 ENCOUNTER — Telehealth: Payer: Self-pay | Admitting: Interventional Cardiology

## 2021-10-01 NOTE — Telephone Encounter (Signed)
-----   Message from Lyn Records, MD sent at 09/29/2021  1:52 PM EDT ----- Let the patient know the heart size and function are normal. A copy will be sent to Pcp, No

## 2021-10-01 NOTE — Telephone Encounter (Signed)
The patient has been notified of the result and verbalized understanding.  All questions (if any) were answered. Loa Socks, LPN 11/73/5670 1:41 PM

## 2021-10-01 NOTE — Telephone Encounter (Signed)
Pt is calling for Echo results from 09/29/21

## 2021-10-07 ENCOUNTER — Other Ambulatory Visit: Payer: Self-pay

## 2021-10-07 ENCOUNTER — Ambulatory Visit
Admission: EM | Admit: 2021-10-07 | Discharge: 2021-10-07 | Disposition: A | Discharge status: Home / Self Care | Payer: Commercial Managed Care - PPO | Attending: Physician Assistant | Admitting: Physician Assistant

## 2021-10-07 DIAGNOSIS — Z113 Encounter for screening for infections with a predominantly sexual mode of transmission: Secondary | ICD-10-CM | POA: Diagnosis not present

## 2021-10-07 LAB — POCT URINALYSIS DIP (MANUAL ENTRY)
Bilirubin, UA: NEGATIVE
Blood, UA: NEGATIVE
Glucose, UA: NEGATIVE mg/dL
Ketones, POC UA: NEGATIVE mg/dL
Leukocytes, UA: NEGATIVE
Nitrite, UA: NEGATIVE
Protein Ur, POC: NEGATIVE mg/dL
Spec Grav, UA: 1.02 (ref 1.010–1.025)
Urobilinogen, UA: 0.2 E.U./dL
pH, UA: 7 (ref 5.0–8.0)

## 2021-10-07 NOTE — ED Triage Notes (Signed)
Pt states that he has headaches, lower abd pain and pressure with urination that started yesterday.

## 2021-10-07 NOTE — ED Provider Notes (Signed)
[facility]    CSN: [id number] Arrival date & time: 10/07/21  1338      History   Chief Complaint No chief complaint on file.   HPI Victor Burns is a 30 y.o. male.   Patient here today for evaluation of headache, lower abdominal pressure specifically with urination, started yesterday.  He has not had fever or chills.  He denies any genital lesions.  He has not had any nasal congestion, sore throat, or cough.  He would like STD screening if possible. He has tried drinking extra water without resolution of symptoms.   The history is provided by the patient.   Past Medical History:  Diagnosis Date   Abscess    Arthritis    knee, back   Asthma     Patient Active Problem List   Diagnosis Date Noted   Perirectal abscess s/p I&D 06/16/2018 06/16/2018   Alcohol abuse 06/16/2018   Mild persistent asthma 10/07/2017    Past Surgical History:  Procedure Laterality Date   EVALUATION UNDER ANESTHESIA WITH HEMORRHOIDECTOMY N/A 08/08/2019   Procedure: EXAM UNDER ANESTHESIA WITH IRRIGATION AND DRAINAGE OF PERIRECTAL ABSCESS;  Surgeon: Ileana Roup, MD;  Location: [facility];  Service: General;  Laterality: N/A;   FISTULOTOMY N/A 02/20/2020   Procedure: PARTIAL  FISTULOTOMY WITH PLACEMENT OF CUTTING TETON;  Surgeon: Ileana Roup, MD;  [facility];  Service: General;  Laterality: N/A;   INCISION AND DRAINAGE PERIRECTAL ABSCESS N/A 06/16/2018   Procedure: Jasmine December UNDER ANESTHESIA IRRIGATION AND DEBRIDEMENT PERIRECTAL ABSCESS;  Surgeon: Rolm Bookbinder, MD;  Location: [facility];  Service: General;  Laterality: N/A;   KNEE ARTHROSCOPY     PLACEMENT OF SETON  08/08/2019   Procedure: PLACEMENT OF DRAINING SETON;  Surgeon: Ileana Roup, MD;  [facility];  Service: General;;   RECTAL EXAM UNDER ANESTHESIA N/A 02/20/2020   Procedure: ANORECTAL EXAM UNDER ANESTHESIA;  Surgeon: Ileana Roup, MD;  Location: [facility];  Service: General;  Laterality: N/A;        Home Medications    Prior to Admission medications   Medication Sig Start Date End Date Taking? Authorizing Provider  albuterol (VENTOLIN HFA) 108 (90 Base) MCG/ACT inhaler Inhale 1-2 puffs into the lungs every 6 (six) hours as needed for wheezing or shortness of breath. 03/04/21   Maudie Flakes, MD  busPIRone (BUSPAR) 5 MG tablet Take 5-10 mg by mouth 3 (three) times daily as needed. panic 05/28/21   [provider]  escitalopram (LEXAPRO) 10 MG tablet Take 10 mg by mouth daily. 05/28/21   [provider]  hydrOXYzine (ATARAX/VISTARIL) 25 MG tablet Take 1 tablet (25 mg total) by mouth every 6 (six) hours. 04/08/21   Fatima Blank, MD  pantoprazole (PROTONIX) 20 MG tablet Take 1 tablet (20 mg total) by mouth daily. 07/12/21   Deliah Boston, PA-C    Family History Family History  Problem Relation Age of Onset   Hypertension Mother    Hypertension Father     Social History Social History   Tobacco Use   Smoking status: Some Days    Packs/day: 0.25    Years: 10.00    Pack years: 2.50    Types: Cigarettes   Smokeless tobacco: Never  Vaping Use   Vaping Use: Never used  Substance Use Topics   Alcohol use: Yes    Alcohol/week: 10.0 standard drinks    Types: 10 Cans of beer per week    Comment: 2  bottles liquor/ per wk   Drug use: Yes    Types: Marijuana, Cocaine    Comment: Pt reports smoking every other day (2g)     Allergies   Bee venom, Shellfish allergy, Compazine [prochlorperazine], and Other   Review of Systems Review of Systems  Constitutional:  Negative for chills and fever.  HENT:  Negative for congestion and sore throat.   Eyes:  Negative for discharge and redness.  Respiratory:  Negative for shortness of breath.   Gastrointestinal:  Negative for nausea and vomiting.  Genitourinary:  Negative for genital sores, hematuria and penile discharge.  Skin:  Positive for color change and wound.  Neurological:  Positive for  headaches. Negative for numbness.    Physical Exam Triage Vital Signs ED Triage Vitals [10/07/21 1451]  Enc Vitals Group     BP 125/83     Pulse Rate (!) 58     Resp 18     Temp 98.1 F (36.7 C)     Temp Source Oral     SpO2 97 %     Weight      Height      Head Circumference      Peak Flow      Pain Score 7     Pain Loc      Pain Edu?      Excl. in [place]?    No data found.  Updated Vital Signs BP 125/83 (BP Location: Left Arm)   Pulse (!) 58   Temp 98.1 F (36.7 C) (Oral)   Resp 18   SpO2 97%      Physical Exam Vitals and nursing note reviewed.  Constitutional:      General: He is not in acute distress.    Appearance: Normal appearance. He is not ill-appearing.  HENT:     Head: Normocephalic and atraumatic.  Eyes:     Conjunctiva/sclera: Conjunctivae normal.  Cardiovascular:     Rate and Rhythm: Normal rate.  Pulmonary:     Effort: Pulmonary effort is normal.  Neurological:     Mental Status: He is alert.  Psychiatric:        Mood and Affect: Mood normal.        Behavior: Behavior normal.        Thought Content: Thought content normal.     UC Treatments / Results  Labs (all labs ordered are listed, but only abnormal results are displayed) Labs Reviewed  HIV ANTIBODY (ROUTINE TESTING W REFLEX)  HEPATITIS PANEL, ACUTE  RPR  POCT URINALYSIS DIP (MANUAL ENTRY)  CYTOLOGY, (ORAL, ANAL, URETHRAL) ANCILLARY ONLY    EKG   Radiology No results found.  Procedures Procedures (including critical care time)  Medications Ordered in UC Medications - No data to display  Initial Impression / Assessment and Plan / UC Course  I have reviewed the triage vital signs and the nursing notes.  Pertinent labs & imaging results that were available during my care of the patient were reviewed by me and considered in my medical decision making (see chart for details).   UA without signs of infection. Will order STD screening as requested. Will await results for  further recommendation. Recommended follow up with any further concerns.   Final Clinical Impressions(s) / UC Diagnoses   Final diagnoses:  Screening for STD (sexually transmitted disease)   Discharge Instructions   None    ED Prescriptions   None    PDMP not reviewed this encounter.   Francene Finders,  PA-C 10/07/21 1554

## 2021-10-08 LAB — HIV ANTIBODY (ROUTINE TESTING W REFLEX): HIV Screen 4th Generation wRfx: NONREACTIVE

## 2021-10-08 LAB — CYTOLOGY, (ORAL, ANAL, URETHRAL) ANCILLARY ONLY
Chlamydia: NEGATIVE
Comment: NEGATIVE
Comment: NEGATIVE
Comment: NORMAL
Neisseria Gonorrhea: NEGATIVE
Trichomonas: NEGATIVE

## 2021-10-08 LAB — RPR: RPR Ser Ql: NONREACTIVE

## 2021-10-15 ENCOUNTER — Other Ambulatory Visit: Payer: Self-pay

## 2021-10-15 ENCOUNTER — Encounter (INDEPENDENT_AMBULATORY_CARE_PROVIDER_SITE_OTHER): Payer: Commercial Managed Care - PPO

## 2021-10-15 DIAGNOSIS — R079 Chest pain, unspecified: Secondary | ICD-10-CM | POA: Diagnosis not present

## 2021-10-15 LAB — EXERCISE TOLERANCE TEST
Angina Index: 0
Duke Treadmill Score: 9
Estimated workload: 10.1
Exercise duration (min): 9 min
Exercise duration (sec): 0 s
MPHR: 191 {beats}/min
Peak HR: 166 {beats}/min
Percent HR: 86 %
RPE: 17
Rest HR: 67 {beats}/min
ST Depression (mm): 0 mm

## 2021-10-18 ENCOUNTER — Ambulatory Visit (HOSPITAL_COMMUNITY)
Admission: EM | Admit: 2021-10-18 | Discharge: 2021-10-18 | Disposition: A | Discharge status: Home / Self Care | Payer: Commercial Managed Care - PPO | Attending: Medical Oncology | Admitting: Medical Oncology

## 2021-10-18 ENCOUNTER — Encounter (HOSPITAL_COMMUNITY): Payer: Self-pay

## 2021-10-18 DIAGNOSIS — R300 Dysuria: Secondary | ICD-10-CM

## 2021-10-18 DIAGNOSIS — J4521 Mild intermittent asthma with (acute) exacerbation: Secondary | ICD-10-CM

## 2021-10-18 DIAGNOSIS — R109 Unspecified abdominal pain: Secondary | ICD-10-CM | POA: Diagnosis not present

## 2021-10-18 DIAGNOSIS — R10A0 Flank pain, unspecified side: Secondary | ICD-10-CM

## 2021-10-18 LAB — POCT URINALYSIS DIPSTICK, ED / UC
Bilirubin Urine: NEGATIVE
Glucose, UA: NEGATIVE mg/dL
Ketones, ur: 40 mg/dL — AB
Leukocytes,Ua: NEGATIVE
Nitrite: NEGATIVE
Protein, ur: NEGATIVE mg/dL
Specific Gravity, Urine: 1.025 (ref 1.005–1.030)
Urobilinogen, UA: 1 mg/dL (ref 0.0–1.0)
pH: 6 (ref 5.0–8.0)

## 2021-10-18 MED ORDER — BENZONATATE 100 MG PO CAPS: 100.0000 mg | ORAL_CAPSULE | Freq: Three times a day (TID) | ORAL | 0 refills | Status: DC

## 2021-10-18 MED ORDER — ALBUTEROL SULFATE HFA 108 (90 BASE) MCG/ACT IN AERS: 1.0000 | INHALATION_SPRAY | Freq: Four times a day (QID) | RESPIRATORY_TRACT | 0 refills | Status: DC | PRN

## 2021-10-18 MED ORDER — PREDNISONE 10 MG (21) PO TBPK: ORAL_TABLET | Freq: Every day | ORAL | 0 refills | Status: DC

## 2021-10-18 MED ORDER — TAMSULOSIN HCL 0.4 MG PO CAPS: 0.4000 mg | ORAL_CAPSULE | Freq: Every day | ORAL | 0 refills | Status: DC

## 2021-10-18 NOTE — ED Provider Notes (Signed)
[facility]    CSN: [id number] Arrival date & time: 10/18/21  1021      History   Chief Complaint Chief Complaint  Patient presents with   Cough   Urinary Retention    HPI Victor Burns is a 30 y.o. male.   HPI  Cough: Pt reports that he has had a cough for the past day. He has used his inhaler for his mild intermittent asthma with some success but he needs a refill. He denies hemoptysis, fever, SOB. He has had some mild pleuritic chest discomfort with coughing fits but none at rest.   Trouble urinating: Pt reports that since yesterday he has had some intermittent lower abdominal pain, left flank pain and mild dysuria. He denies constipation, rectal pain, pain with bowel movements, penile discharge, vomiting. He reports that he had negative STI testing about a month ago and has not had any sexual activity since so he declines STI testing today.   Past Medical History:  Diagnosis Date   Abscess    Arthritis    knee, back   Asthma     Patient Active Problem List   Diagnosis Date Noted   Perirectal abscess s/p I&D 06/16/2018 06/16/2018   Alcohol abuse 06/16/2018   Mild persistent asthma 10/07/2017    Past Surgical History:  Procedure Laterality Date   EVALUATION UNDER ANESTHESIA WITH HEMORRHOIDECTOMY N/A 08/08/2019   Procedure: EXAM UNDER ANESTHESIA WITH IRRIGATION AND DRAINAGE OF PERIRECTAL ABSCESS;  Surgeon: Ileana Roup, MD;  Location: [facility];  Service: General;  Laterality: N/A;   FISTULOTOMY N/A 02/20/2020   Procedure: PARTIAL  FISTULOTOMY WITH PLACEMENT OF CUTTING TETON;  Surgeon: Ileana Roup, MD;  Location: [facility];  Service: General;  Laterality: N/A;   INCISION AND DRAINAGE PERIRECTAL ABSCESS N/A 06/16/2018   Procedure: Jasmine December UNDER ANESTHESIA IRRIGATION AND DEBRIDEMENT PERIRECTAL ABSCESS;  Surgeon: Rolm Bookbinder, MD;  Location: [facility];  Service: General;  Laterality: N/A;   KNEE ARTHROSCOPY     PLACEMENT OF SETON  08/08/2019    Procedure: PLACEMENT OF DRAINING SETON;  Surgeon: Ileana Roup, MD;  Location: [facility];  Service: General;;   RECTAL EXAM UNDER ANESTHESIA N/A 02/20/2020   Procedure: ANORECTAL EXAM UNDER ANESTHESIA;  Surgeon: Ileana Roup, MD;  Location: [facility];  Service: General;  Laterality: N/A;       Home Medications    Prior to Admission medications   Medication Sig Start Date End Date Taking? Authorizing Provider  albuterol (VENTOLIN HFA) 108 (90 Base) MCG/ACT inhaler Inhale 1-2 puffs into the lungs every 6 (six) hours as needed for wheezing or shortness of breath. 03/04/21   Maudie Flakes, MD  busPIRone (BUSPAR) 5 MG tablet Take 5-10 mg by mouth 3 (three) times daily as needed. panic 05/28/21   [provider]  escitalopram (LEXAPRO) 10 MG tablet Take 10 mg by mouth daily. 05/28/21   [provider]  hydrOXYzine (ATARAX/VISTARIL) 25 MG tablet Take 1 tablet (25 mg total) by mouth every 6 (six) hours. 04/08/21   Fatima Blank, MD  pantoprazole (PROTONIX) 20 MG tablet Take 1 tablet (20 mg total) by mouth daily. 07/12/21   Deliah Boston, PA-C    Family History Family History  Problem Relation Age of Onset   Hypertension Mother    Hypertension Father     Social History Social History   Tobacco Use   Smoking status: Some Days    Packs/day: 0.25    Years: 10.00  Pack years: 2.50    Types: Cigarettes   Smokeless tobacco: Never  Vaping Use   Vaping Use: Never used  Substance Use Topics   Alcohol use: Yes    Alcohol/week: 10.0 standard drinks    Types: 10 Cans of beer per week    Comment: 2 bottles liquor/ per wk   Drug use: Yes    Types: Marijuana, Cocaine    Comment: Pt reports smoking every other day (2g)     Allergies   Bee venom, Shellfish allergy, Compazine [prochlorperazine], and Other   Review of Systems Review of Systems  As stated above in HPI Physical Exam Triage Vital Signs ED Triage Vitals  Enc Vitals Group     BP  10/18/21 1156 133/83     Pulse Rate 10/18/21 1156 (!) 57     Resp 10/18/21 1156 17     Temp 10/18/21 1156 98.2 F (36.8 C)     Temp Source 10/18/21 1156 Oral     SpO2 10/18/21 1156 97 %     Weight --      Height --      Head Circumference --      Peak Flow --      Pain Score 10/18/21 1158 5     Pain Loc --      Pain Edu? --      Excl. in [place]? --    No data found.  Updated Vital Signs BP 133/83 (BP Location: Left Arm)   Pulse (!) 57   Temp 98.2 F (36.8 C) (Oral)   Resp 17   SpO2 97%   Physical Exam Vitals and nursing note reviewed.  Constitutional:      General: He is not in acute distress.    Appearance: Normal appearance. He is not ill-appearing, toxic-appearing or diaphoretic.  HENT:     Head: Normocephalic and atraumatic.     Right Ear: Tympanic membrane normal.     Left Ear: Tympanic membrane normal.     Nose: Congestion present. No rhinorrhea.     Mouth/Throat:     Mouth: Mucous membranes are moist.     Pharynx: Oropharynx is clear. No oropharyngeal exudate or posterior oropharyngeal erythema.  Eyes:     Extraocular Movements: Extraocular movements intact.     Pupils: Pupils are equal, round, and reactive to light.  Cardiovascular:     Rate and Rhythm: Normal rate and regular rhythm.     Pulses: Normal pulses.     Heart sounds: Normal heart sounds.  Pulmonary:     Effort: Pulmonary effort is normal.     Breath sounds: Normal breath sounds. No wheezing.  Abdominal:     General: Bowel sounds are normal. There is no distension.     Palpations: Abdomen is soft. There is no mass.     Tenderness: There is no abdominal tenderness. There is no right CVA tenderness, left CVA tenderness, guarding or rebound.     Hernia: No hernia is present.  Musculoskeletal:     Cervical back: Normal range of motion and neck supple.  Lymphadenopathy:     Cervical: No cervical adenopathy.  Skin:    General: Skin is warm.  Neurological:     Mental Status: He is alert and  oriented to person, place, and time.     UC Treatments / Results  Labs (all labs ordered are listed, but only abnormal results are displayed) Labs Reviewed  URINE CULTURE  POCT URINALYSIS DIPSTICK, ED / UC  EKG   Radiology No results found.  Procedures Procedures (including critical care time)  Medications Ordered in UC Medications - No data to display  Initial Impression / Assessment and Plan / UC Course  I have reviewed the triage vital signs and the nursing notes.  Pertinent labs & imaging results that were available during my care of the patient were reviewed by me and considered in my medical decision making (see chart for details).     New.  Treating for an acute asthma exacerbation with albuterol and prednisone as this is worked well for him in the past and this may help what appears to be a kidney stone that he has.  We elected to trial Flomax along with hydration with water.  As he currently is not having any pain as his pain last was reported last night he could have passed that stone.  He reports that he is still able to urinate but we did discuss red flag signs and symptoms.  Follow-up as needed. Final Clinical Impressions(s) / UC Diagnoses   Final diagnoses:  None   Discharge Instructions   None    ED Prescriptions   None    PDMP not reviewed this encounter.   Rushie Chestnut, [place] 10/18/21 1230

## 2021-10-18 NOTE — ED Triage Notes (Signed)
Pt presents with productive cough that causes intermittent chest discomfort and scratchy throat since yesterday; pt has been using inhaler ( that he states he needs a refill on) intermittently.  Pt also complains of intermittent difficulty urinating yesterday with some lower abdominal pain.

## 2021-10-19 LAB — URINE CULTURE: Culture: NO GROWTH

## 2021-11-05 ENCOUNTER — Telehealth: Payer: Self-pay | Admitting: Interventional Cardiology

## 2021-11-05 NOTE — Telephone Encounter (Signed)
I spoke with patient. He recently purchased a BP cuff and starting check BP/heart rate at home. He reports the following readings- 11/22-142/91 11/23-139/89 11/24-145/89. Heart beat was regular on above readings. He reports summary shows he had irregular heart beat on 11/25, 11/26 and today. Readings were- 11/25-152/88, 150/105, 60 11/26-131/75,99 12/1-134/81, 60 He does not feel palpitations or irregular heart beat.  Has chest pain at times.  This started prior to seeing Dr Katrinka Blazing and is the same type pain he has been having. Has had a headache for the last 3 days. Was on prednisone and finished prior to Thanksgiving. Will forward to Dr Katrinka Blazing for review/recommendations.

## 2021-11-05 NOTE — Telephone Encounter (Signed)
   Pt c/o BP issue: STAT if pt c/o blurred vision, one-sided weakness or slurred speech  1. What are your last 5 BP readings? 152/88, 150/105, 131/75,  Today: 134/81  2. Are you having any other symptoms (ex. Dizziness, headache, blurred vision, passed out)? Headache   3. What is your BP issue? Pt said when he checks his BP the machine said that he has irregular heart beat. He would like to know if he can see Dr. Katrinka Blazing for recommendation

## 2021-11-06 NOTE — Telephone Encounter (Signed)
Left message to call back  

## 2021-11-09 ENCOUNTER — Other Ambulatory Visit: Payer: Self-pay

## 2021-11-09 ENCOUNTER — Ambulatory Visit (HOSPITAL_COMMUNITY)
Admission: EM | Admit: 2021-11-09 | Discharge: 2021-11-09 | Disposition: A | Discharge status: Home / Self Care | Payer: Commercial Managed Care - PPO | Attending: Internal Medicine | Admitting: Internal Medicine

## 2021-11-09 ENCOUNTER — Encounter (HOSPITAL_COMMUNITY): Payer: Self-pay

## 2021-11-09 DIAGNOSIS — J3489 Other specified disorders of nose and nasal sinuses: Secondary | ICD-10-CM | POA: Diagnosis not present

## 2021-11-09 NOTE — Telephone Encounter (Signed)
Pt is returning call.  

## 2021-11-09 NOTE — ED Provider Notes (Signed)
[facility]    CSN: [id number] Arrival date & time: 11/09/21  1651      History   Chief Complaint No chief complaint on file.   HPI Sharod Petsch is a 30 y.o. male.   Patient presents with burning sensation to the nos for 1 day. Endorses 2 days ago nasal drainage was present, which has resolved. Has attempted use a saline rinse, which was not helpful.  Denies fever, chills, body aches, ear pain, headache, sore throat, coughing, shortness of breath, wheezing, abdominal pain, nausea, vomiting, diarrhea.  History of asthma.   Past Medical History:  Diagnosis Date   Abscess    Arthritis    knee, back   Asthma     Patient Active Problem List   Diagnosis Date Noted   Perirectal abscess s/p I&D 06/16/2018 06/16/2018   Alcohol abuse 06/16/2018   Mild persistent asthma 10/07/2017    Past Surgical History:  Procedure Laterality Date   EVALUATION UNDER ANESTHESIA WITH HEMORRHOIDECTOMY N/A 08/08/2019   Procedure: EXAM UNDER ANESTHESIA WITH IRRIGATION AND DRAINAGE OF PERIRECTAL ABSCESS;  Surgeon: Andria Meuse, MD;  Location: [facility];  Service: General;  Laterality: N/A;   FISTULOTOMY N/A 02/20/2020   Procedure: PARTIAL  FISTULOTOMY WITH PLACEMENT OF CUTTING TETON;  Surgeon: Andria Meuse, MD;  Location: [facility];  Service: General;  Laterality: N/A;   INCISION AND DRAINAGE PERIRECTAL ABSCESS N/A 06/16/2018   Procedure: Francia Greaves UNDER ANESTHESIA IRRIGATION AND DEBRIDEMENT PERIRECTAL ABSCESS;  Surgeon: Emelia Loron, MD;  Location: [facility];  Service: General;  Laterality: N/A;   KNEE ARTHROSCOPY     PLACEMENT OF SETON  08/08/2019   Procedure: PLACEMENT OF DRAINING SETON;  Surgeon: Andria Meuse, MD;  Location: [facility];  Service: General;;   RECTAL EXAM UNDER ANESTHESIA N/A 02/20/2020   Procedure: ANORECTAL EXAM UNDER ANESTHESIA;  Surgeon: Andria Meuse, MD;  Location: [facility];  Service: General;  Laterality: N/A;       Home Medications    Prior to  Admission medications   Medication Sig Start Date End Date Taking? Authorizing Provider  albuterol (VENTOLIN HFA) 108 (90 Base) MCG/ACT inhaler Inhale 1-2 puffs into the lungs every 6 (six) hours as needed for wheezing or shortness of breath. 10/18/21   Rushie Chestnut, PA-C  amoxicillin (AMOXIL) 500 MG capsule Take 500 mg by mouth 3 (three) times daily. 10/19/21   [provider]  benzonatate (TESSALON) 100 MG capsule Take 1 capsule (100 mg total) by mouth every 8 (eight) hours. 10/18/21   Rushie Chestnut, PA-C  busPIRone (BUSPAR) 5 MG tablet Take 5-10 mg by mouth 3 (three) times daily as needed. panic 05/28/21   [provider]  escitalopram (LEXAPRO) 10 MG tablet Take 10 mg by mouth daily. 05/28/21   [provider]  hydrOXYzine (ATARAX/VISTARIL) 25 MG tablet Take 1 tablet (25 mg total) by mouth every 6 (six) hours. 04/08/21   Nira Conn, MD  pantoprazole (PROTONIX) 20 MG tablet Take 1 tablet (20 mg total) by mouth daily. 07/12/21   Harlene Salts A, PA-C  predniSONE (STERAPRED UNI-PAK 21 TAB) 10 MG (21) TBPK tablet Take by mouth daily. Take 6 tabs by mouth daily  for 2 days, then 5 tabs for 2 days, then 4 tabs for 2 days, then 3 tabs for 2 days, 2 tabs for 2 days, then 1 tab by mouth daily for 2 days 10/18/21   Rushie Chestnut, PA-C  tamsulosin (FLOMAX) 0.4 MG CAPS capsule  Take 1 capsule (0.4 mg total) by mouth daily after supper. 10/18/21   Hughie Closs, PA-C    Family History Family History  Problem Relation Age of Onset   Hypertension Mother    Hypertension Father     Social History Social History   Tobacco Use   Smoking status: Some Days    Packs/day: 0.25    Years: 10.00    Pack years: 2.50    Types: Cigarettes   Smokeless tobacco: Never  Vaping Use   Vaping Use: Never used  Substance Use Topics   Alcohol use: Yes    Alcohol/week: 10.0 standard drinks    Types: 10 Cans of beer per week    Comment: 2 bottles liquor/ per wk    Drug use: Yes    Types: Marijuana, Cocaine    Comment: Pt reports smoking every other day (2g)     Allergies   Bee venom, Shellfish allergy, Compazine [prochlorperazine], and Other   Review of Systems Review of Systems  HENT:  Positive for congestion. Negative for dental problem, drooling, ear discharge, ear pain, facial swelling, hearing loss, mouth sores, nosebleeds, postnasal drip, rhinorrhea, sinus pressure, sinus pain, sneezing, sore throat, tinnitus, trouble swallowing and voice change.   Gastrointestinal: Negative.   Skin: Negative.   Neurological: Negative.     Physical Exam Triage Vital Signs ED Triage Vitals  Enc Vitals Group     BP 11/09/21 1802 128/81     Pulse Rate 11/09/21 1802 (!) 50     Resp 11/09/21 1802 16     Temp 11/09/21 1802 98 F (36.7 C)     Temp Source 11/09/21 1800 Oral     SpO2 11/09/21 1802 100 %     Weight --      Height --      Head Circumference --      Peak Flow --      Pain Score 11/09/21 1800 5     Pain Loc --      Pain Edu? --      Excl. in [place]? --    No data found.  Updated Vital Signs BP 128/81 (BP Location: Right Arm)   Pulse (!) 50   Temp 98 F (36.7 C) (Oral)   Resp 16   SpO2 100%   Visual Acuity Right Eye Distance:   Left Eye Distance:   Bilateral Distance:    Right Eye Near:   Left Eye Near:    Bilateral Near:     Physical Exam Constitutional:      Appearance: Normal appearance. He is normal weight.  HENT:     Head: Normocephalic.     Nose:     Right Turbinates: Swollen.     Left Turbinates: Swollen.  Eyes:     Extraocular Movements: Extraocular movements intact.  Pulmonary:     Effort: Pulmonary effort is normal.  Skin:    General: Skin is warm and dry.  Neurological:     Mental Status: He is alert and oriented to person, place, and time. Mental status is at baseline.  Psychiatric:        Mood and Affect: Mood normal.        Behavior: Behavior normal.     UC Treatments / Results  Labs (all  labs ordered are listed, but only abnormal results are displayed) Labs Reviewed - No data to display  EKG   Radiology No results found.  Procedures Procedures (including critical care time)  Medications Ordered  in UC Medications - No data to display  Initial Impression / Assessment and Plan / UC Course  I have reviewed the triage vital signs and the nursing notes.  Pertinent labs & imaging results that were available during my care of the patient were reviewed by me and considered in my medical decision making (see chart for details).  Dry nares  Recommended moistening nares with petroleum based product twice a day for 7 days Recommended use of humidifier, steamed showers,afrin as needed if nosebleeds occur   UC follow up as needed  Final Clinical Impressions(s) / UC Diagnoses   Final diagnoses:  None   Discharge Instructions   None    ED Prescriptions   None    PDMP not reviewed this encounter.   Hans Eden, NP 11/09/21 [phone]

## 2021-11-09 NOTE — Telephone Encounter (Signed)
Spoke with pt and made him aware of information from Dr. Katrinka Blazing.  Pt agreeable to plan.

## 2021-11-09 NOTE — Discharge Instructions (Signed)
I believe your nasal cavity is dry due to your recent congestion and the cold air  Begin moistening nose twice a day with petroleum based product to ad moisture  May sit in steamed bathroom to moisten upper airways, may use humidifier at nighttime to keep air moist if you do not have a humidifier my crack window   If nosebleeds began may purchase over-the-counter Afrin spray for use to stop bleeding  May follow up with urgent care as needed

## 2021-11-20 ENCOUNTER — Ambulatory Visit: Payer: Commercial Managed Care - PPO

## 2021-11-20 NOTE — Progress Notes (Signed)
Subjective:    Victor Burns - 30 y.o. male MRN [id number]  Date of birth: Dec 04, 1991  HPI  Victor Burns is to establish care.   Current issues and/or concerns: Reports left side flank pain for 12 months. Feels as if a muscle has been pulled. Denies any recent injury and/or trauma. Cannot lift heavy items, lean on the left side, or sleep on the left side. Feels like the pain is on the inside and cannot physically touch what is causing the pain. Denies shortness of breath. Chest pain comes and goes. Home blood pressures 130's/80's. Trying to decrease salt intake, does not participate in exercise, he is a smoker. Does not have a physically demanding job as he works from home. Recently seen by Cardiology where he had an echo and stress test which were normal. Has not scheduled follow-up because of normal tests. Seen at the Urgent Care November 2022, treated for kidney stones. Reports did not pass kidney stone also not straining urine. Constipation comes and goes. Completed Flomax. Recently vinegar-based and spicy food causing burning sensation in chest.    ROS per HPI     Health Maintenance:  Health Maintenance Due  Topic Date Due   COVID-19 Vaccine (1) Never done   Pneumococcal Vaccine 17-81 Years old (1 - PCV) Never done     Past Medical History: Patient Active Problem List   Diagnosis Date Noted   GERD (gastroesophageal reflux disease) 11/24/2021   Perirectal abscess s/p I&D 06/16/2018 06/16/2018   Alcohol abuse 06/16/2018   Mild persistent asthma 10/07/2017    Social History   reports that he has been smoking cigarettes. He has a 2.50 pack-year smoking history. He has never used smokeless tobacco. He reports current alcohol use of about 10.0 standard drinks per week. He reports current drug use. Drugs: Marijuana and Cocaine.   Family History  family history includes Hypertension in his father and mother.   Medications: reviewed and updated   Objective:   Physical Exam BP  133/85 (BP Location: Left Arm, Patient Position: Sitting, Cuff Size: Large)    Pulse 60    Temp 98.3 F (36.8 C)    Resp 18    Ht 5' 10.98" (1.803 m)    Wt 238 lb 3.2 oz (108 kg)    SpO2 96%    BMI 33.24 kg/m   Physical Exam HENT:     Head: Normocephalic and atraumatic.  Eyes:     Extraocular Movements: Extraocular movements intact.     Conjunctiva/sclera: Conjunctivae normal.     Pupils: Pupils are equal, round, and reactive to light.  Cardiovascular:     Rate and Rhythm: Normal rate and regular rhythm.     Pulses: Normal pulses.     Heart sounds: Normal heart sounds.  Pulmonary:     Effort: Pulmonary effort is normal.     Breath sounds: Normal breath sounds.  Musculoskeletal:     Cervical back: Normal range of motion and neck supple.  Neurological:     General: No focal deficit present.     Mental Status: He is alert and oriented to person, place, and time.  Psychiatric:        Mood and Affect: Mood normal.        Behavior: Behavior normal.    Assessment & Plan:  1. Encounter to establish care: - Patient presents today to establish care.  - Return for annual physical examination, labs, and health maintenance. Arrive fasting meaning having no food for at least  8 hours prior to appointment. You may have only water or black coffee. Please take scheduled medications as normal.  2. Left flank pain: - Diagnostic chest x-ray for further evaluation.  - Follow-up with primary provider as scheduled. - DG Chest 2 View; Future  3. Atypical chest pain: - Last echocardiogram 09/29/2021. - Last stress test 10/15/2021. - Follow-up with Cardiology as scheduled.  4. Gastroesophageal reflux disease, unspecified whether esophagitis present: - Begin Omeprazole as prescribed.  - You may feel better if you: ?Avoid foods that make your symptoms worse - For some people these include coffee, chocolate, alcohol, peppermint, and fatty foods. ?Stop smoking, if you smoke ?Avoid late meals - Lying  down with a full stomach can make reflux worse. Try to plan meals for at least 2 to 3 hours before bedtime. - Follow-up with primary provider as scheduled.  - omeprazole (PRILOSEC) 20 MG capsule; Take 1 capsule (20 mg total) by mouth daily.  Dispense: 120 capsule; Refill: 0     Patient was given clear instructions to go to Emergency Department or return to medical center if symptoms don't improve, worsen, or new problems develop.The patient verbalized understanding.  I discussed the assessment and treatment plan with the patient. The patient was provided an opportunity to ask questions and all were answered. The patient agreed with the plan and demonstrated an understanding of the instructions.   The patient was advised to call back or seek an in-person evaluation if the symptoms worsen or if the condition fails to improve as anticipated.    Ricky Stabs, NP 11/24/2021, 10:25 AM Primary Care at [facility]

## 2021-11-24 ENCOUNTER — Encounter: Payer: Self-pay | Admitting: Family

## 2021-11-24 ENCOUNTER — Other Ambulatory Visit: Payer: Self-pay | Admitting: Family

## 2021-11-24 ENCOUNTER — Other Ambulatory Visit: Payer: Self-pay

## 2021-11-24 ENCOUNTER — Ambulatory Visit (INDEPENDENT_AMBULATORY_CARE_PROVIDER_SITE_OTHER): Payer: Commercial Managed Care - PPO

## 2021-11-24 ENCOUNTER — Ambulatory Visit (INDEPENDENT_AMBULATORY_CARE_PROVIDER_SITE_OTHER): Payer: Commercial Managed Care - PPO | Admitting: Family

## 2021-11-24 VITALS — BP 133/85 | HR 60 | Temp 98.3°F | Resp 18 | Ht 70.98 in | Wt 238.2 lb

## 2021-11-24 DIAGNOSIS — Z7689 Persons encountering health services in other specified circumstances: Secondary | ICD-10-CM

## 2021-11-24 DIAGNOSIS — R0789 Other chest pain: Secondary | ICD-10-CM

## 2021-11-24 DIAGNOSIS — K219 Gastro-esophageal reflux disease without esophagitis: Secondary | ICD-10-CM | POA: Diagnosis not present

## 2021-11-24 DIAGNOSIS — R109 Unspecified abdominal pain: Secondary | ICD-10-CM

## 2021-11-24 DIAGNOSIS — J40 Bronchitis, not specified as acute or chronic: Secondary | ICD-10-CM

## 2021-11-24 MED ORDER — OMEPRAZOLE 20 MG PO CPDR: 20.0000 mg | DELAYED_RELEASE_CAPSULE | Freq: Every day | ORAL | 0 refills | Status: DC

## 2021-11-24 NOTE — Progress Notes (Signed)
Pt presents to establish care, pt complains of left side pain above rib cage that radiates to chest

## 2021-11-24 NOTE — Patient Instructions (Signed)
Thank you for choosing Primary Care at [facility] for your medical home!    Victor Burns was seen by Rema Fendt, NP today.   Victor Burns's primary care provider is Rema Fendt, NP.   For the best care possible,  you should try to see Ricky Stabs, NP whenever you come to clinic.   We look forward to seeing you again soon!  If you have any questions about your visit today,  please call us at [phone]  Or feel free to reach your provider via MyChart.    Keeping you healthy   Get these tests Blood pressure- Have your blood pressure checked once a year by your healthcare provider.  Normal blood pressure is 120/80. Weight- Have your body mass index (BMI) calculated to screen for obesity.  BMI is a measure of body fat based on height and weight. You can also calculate your own BMI at https://www.west-esparza.com/. Cholesterol- Have your cholesterol checked regularly starting at age 53, sooner may be necessary if you have diabetes, high blood pressure, if a family member developed heart diseases at an early age or if you smoke.  Chlamydia, HIV, and other sexual transmitted disease- Get screened each year until the age of 21 then within three months of each new sexual partner. Diabetes- Have your blood sugar checked regularly if you have high blood pressure, high cholesterol, a family history of diabetes or if you are overweight.   Get these vaccines Flu shot- Every fall. Tetanus shot- Every 10 years. Menactra- Single dose; prevents meningitis.   Take these steps Don't smoke- If you do smoke, ask your healthcare provider about quitting. For tips on how to quit, go to www.smokefree.gov or call 1-800-QUIT-NOW. Be physically active- Exercise 5 days a week for at least 30 minutes.  If you are not already physically active start slow and gradually work up to 30 minutes of moderate physical activity.  Examples of moderate activity include walking briskly, mowing the yard, dancing,  swimming bicycling, etc. Eat a healthy diet- Eat a variety of healthy foods such as fruits, vegetables, low fat milk, low fat cheese, yogurt, lean meats, poultry, fish, beans, tofu, etc.  For more information on healthy eating, go to www.thenutritionsource.org Drink alcohol in moderation- Limit alcohol intake two drinks or less a day.  Never drink and drive. Dentist- Brush and floss teeth twice daily; visit your dentis twice a year. Depression-Your emotional health is as important as your physical health.  If you're feeling down, losing interest in things you normally enjoy please talk with your healthcare provider. Gun Safety- If you keep a gun in your home, keep it unloaded and with the safety lock on.  Bullets should be stored separately. Helmet use- Always wear a helmet when riding a motorcycle, bicycle, rollerblading or skateboarding. Safe sex- If you may be exposed to a sexually transmitted infection, use a condom Seat belts- Seat bels can save your life; always wear one. Smoke/Carbon Monoxide detectors- These detectors need to be installed on the appropriate level of your home.  Replace batteries at least once a year. Skin Cancer- When out in the sun, cover up and use sunscreen SPF 15 or higher. Violence- If anyone is threatening or hurting you, please tell your healthcare provider.

## 2021-11-24 NOTE — Progress Notes (Signed)
Heart normal size. Lungs clear. Bronchitis evident. Referral to Pulmonology for further evaluation and management. Their office should call patient within 2 weeks with appointment details.

## 2021-11-26 ENCOUNTER — Encounter: Payer: Self-pay | Admitting: Family

## 2021-11-26 ENCOUNTER — Telehealth: Payer: Self-pay | Admitting: Family

## 2021-11-26 NOTE — Telephone Encounter (Signed)
Spoke w/pt about xray, and not sure why referral already contacted him before office contacted him about xray results.

## 2021-11-26 NOTE — Telephone Encounter (Signed)
Pt was contacted at 11:20 a.m. in regards to referral and Xray results

## 2021-11-26 NOTE — Telephone Encounter (Signed)
PT asking for a Call today if possible- Pt states he received a call regarding a referral but states he didn't know he was referred anywhere and pt emphasized he is scared as to why he needs to be tested for Bronchitis.  Thank you

## 2021-12-18 ENCOUNTER — Ambulatory Visit (INDEPENDENT_AMBULATORY_CARE_PROVIDER_SITE_OTHER): Payer: Commercial Managed Care - PPO | Admitting: Pulmonary Disease

## 2021-12-18 ENCOUNTER — Other Ambulatory Visit: Payer: Self-pay

## 2021-12-18 ENCOUNTER — Encounter: Payer: Self-pay | Admitting: Pulmonary Disease

## 2021-12-18 VITALS — BP 130/84 | HR 59 | Temp 98.3°F | Ht 71.0 in | Wt 241.0 lb

## 2021-12-18 DIAGNOSIS — R0602 Shortness of breath: Secondary | ICD-10-CM | POA: Diagnosis not present

## 2021-12-18 DIAGNOSIS — F17210 Nicotine dependence, cigarettes, uncomplicated: Secondary | ICD-10-CM

## 2021-12-18 DIAGNOSIS — J4540 Moderate persistent asthma, uncomplicated: Secondary | ICD-10-CM | POA: Diagnosis not present

## 2021-12-18 MED ORDER — BUDESONIDE-FORMOTEROL FUMARATE 160-4.5 MCG/ACT IN AERO: 2.0000 | INHALATION_SPRAY | Freq: Two times a day (BID) | RESPIRATORY_TRACT | 5 refills | Status: DC

## 2021-12-18 NOTE — Patient Instructions (Signed)
We get an IgE level today Schedule PFTs Start Symbicort 160/4.52 puffs twice daily Follow-up in 3 months

## 2021-12-18 NOTE — Progress Notes (Signed)
Victor Burns    [id number]    December 01, 1991  Primary Care Physician:Stephens, Flonnie Hailstone, NP  Referring Physician: Camillia Herter, NP [address] [place],  [place] [address]  Chief complaint: Consult for bronchitis, asthma  HPI: 31 year old with history of asthma, anxiety Referred for evaluation of bronchitis. He has intermittent chest tightness with dyspnea and wheezing.  Evaluated in the ED on 11/09/2021 with chest x-ray showing bronchitis changes. He has history of childhood asthma.  Previously maintained on controller medication but now is using albuterol 3-4 times a day.  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  Previously worked in other house Exposures: No mold, hot tub, Customer service manager.  No feather pillows or comforters Smoking history: Smokes tobacco and marijuana on the weekends Travel history: No significant travel history Relevant family history: No family history of lung disease  Outpatient Encounter Medications as of 12/18/2021  Medication Sig   acetaminophen (TYLENOL) 500 MG tablet Take 500 mg by mouth every 6 (six) hours as needed.   albuterol (VENTOLIN HFA) 108 (90 Base) MCG/ACT inhaler Inhale 1-2 puffs into the lungs every 6 (six) hours as needed for wheezing or shortness of breath.   ibuprofen (ADVIL) 600 MG tablet Take 600 mg by mouth every 6 (six) hours as needed.   omeprazole (PRILOSEC) 20 MG capsule Take 1 capsule (20 mg total) by mouth daily. (Patient not taking: Reported on 12/18/2021)   No facility-administered encounter medications on file as of 12/18/2021.    Allergies as of 12/18/2021 - Review Complete 12/18/2021  Allergen Reaction Noted   Bee venom Anaphylaxis 05/30/2016   Shellfish allergy Anaphylaxis 05/30/2016   Compazine [prochlorperazine] Other (See Comments) 04/10/2021   Other Itching 08/08/2019    Past Medical History:   Diagnosis Date   Abscess    Arthritis    knee, back   Asthma     Past Surgical History:  Procedure Laterality Date   EVALUATION UNDER ANESTHESIA WITH HEMORRHOIDECTOMY N/A 08/08/2019   Procedure: EXAM UNDER ANESTHESIA WITH IRRIGATION AND DRAINAGE OF PERIRECTAL ABSCESS;  Surgeon: Ileana Roup, MD;  Location: [facility];  Service: General;  Laterality: N/A;   FISTULOTOMY N/A 02/20/2020   Procedure: PARTIAL  FISTULOTOMY WITH PLACEMENT OF CUTTING TETON;  Surgeon: Ileana Roup, MD;  Location: [facility];  Service: General;  Laterality: N/A;   INCISION AND DRAINAGE PERIRECTAL ABSCESS N/A 06/16/2018   Procedure: Jasmine December UNDER ANESTHESIA IRRIGATION AND DEBRIDEMENT PERIRECTAL ABSCESS;  Surgeon: Rolm Bookbinder, MD;  Location: [facility];  Service: General;  Laterality: N/A;   KNEE ARTHROSCOPY     PLACEMENT OF SETON  08/08/2019   Procedure: PLACEMENT OF DRAINING SETON;  Surgeon: Ileana Roup, MD;  Location: [facility];  Service: General;;   RECTAL EXAM UNDER ANESTHESIA N/A 02/20/2020   Procedure: ANORECTAL EXAM UNDER ANESTHESIA;  Surgeon: Ileana Roup, MD;  Location: [facility];  Service: General;  Laterality: N/A;    Family History  Problem Relation Age of Onset   Hypertension Mother    Hypertension Father     Social History   Socioeconomic History   Marital status: Single    Spouse name: Not on file   Number of children: Not on file   Years of education: Not on file   Highest education level: Not on file  Occupational History   Not on file  Tobacco Use  Smoking status: Some Days    Packs/day: 0.25    Years: 10.00    Pack years: 2.50    Types: Cigarettes   Smokeless tobacco: Never   Tobacco comments:    Smokes on weekends   Vaping Use   Vaping Use: Never used  Substance and Sexual Activity   Alcohol use: Yes    Alcohol/week: 10.0 standard drinks    Types: 10 Cans of beer per week    Comment: 2 bottles liquor/ per wk   Drug use: Yes    Types: Marijuana, Cocaine     Comment: Pt reports smoking every other day (2g)   Sexual activity: Not on file  Other Topics Concern   Not on file  Social History Narrative   ** Merged History Encounter **       Social Determinants of Health   Financial Resource Strain: Not on file  Food Insecurity: Not on file  Transportation Needs: Not on file  Physical Activity: Not on file  Stress: Not on file  Social Connections: Not on file  Intimate Partner Violence: Not on file    Review of systems: Review of Systems  Constitutional: Negative for fever and chills.  HENT: Negative.   Eyes: Negative for blurred vision.  Respiratory: as per HPI  Cardiovascular: Negative for chest pain and palpitations.  Gastrointestinal: Negative for vomiting, diarrhea, blood per rectum. Genitourinary: Negative for dysuria, urgency, frequency and hematuria.  Musculoskeletal: Negative for myalgias, back pain and joint pain.  Skin: Negative for itching and rash.  Neurological: Negative for dizziness, tremors, focal weakness, seizures and loss of consciousness.  Endo/Heme/Allergies: Negative for environmental allergies.  Psychiatric/Behavioral: Negative for depression, suicidal ideas and hallucinations.  All other systems reviewed and are negative.  Physical Exam: Blood pressure 130/84, pulse (!) 59, temperature 98.3 F (36.8 C), temperature source Oral, height 5\' 11"  (1.803 m), weight 241 lb (109.3 kg), SpO2 97 %. Gen:      No acute distress HEENT:  EOMI, sclera anicteric Neck:     No masses; no thyromegaly Lungs:    Clear to auscultation bilaterally; normal respiratory effort CV:         Regular rate and rhythm; no murmurs Abd:      + bowel sounds; soft, non-tender; no palpable masses, no distension Ext:    No edema; adequate peripheral perfusion Skin:      Warm and dry; no rash Neuro: alert and oriented x 3 Psych: normal mood and affect  Data Reviewed: Imaging: CT abdomen pelvis 05/21/2019-visualized lung bases are clear CT  abdomen pelvis 11/20/2019-minimal atelectasis at the lung base Chest x-ray 11/24/2021-mild peribronchial thickening  I have reviewed the images personally.  PFTs:  Labs: CBC 09/12/2021-WBC 4.3, eos 1%, absolute eosinophil count 43  Assessment:  Asthma, chronic bronchitis Has history of childhood asthma with recurrent episodes of dyspnea, wheezing.  Suspect anxiety is contributing to presentation as well  CBC reviewed with no significant peripheral eosinophilia. Check IgE, schedule PFTs Start Symbicort  Active smoker Smoking cessation discussed in detail and he is interested in quitting.  He will try over-the-counter medication.  Time spent counseling-3 minutes Reassess at return visit  Plan/Recommendations: IgE, PFTs Symbicort Nicotine patches  Marshell Garfinkel MD [facility] 12/18/2021, 9:44 AM  CC: Camillia Herter, NP

## 2021-12-21 LAB — IGE: IgE (Immunoglobulin E), Serum: 202 kU/L — ABNORMAL HIGH (ref <=114)

## 2021-12-28 NOTE — Progress Notes (Signed)
Erroneous encounter

## 2021-12-31 ENCOUNTER — Encounter: Payer: Commercial Managed Care - PPO | Admitting: Family

## 2021-12-31 DIAGNOSIS — Z131 Encounter for screening for diabetes mellitus: Secondary | ICD-10-CM

## 2021-12-31 DIAGNOSIS — Z13220 Encounter for screening for lipoid disorders: Secondary | ICD-10-CM

## 2021-12-31 DIAGNOSIS — Z13228 Encounter for screening for other metabolic disorders: Secondary | ICD-10-CM

## 2021-12-31 DIAGNOSIS — Z0000 Encounter for general adult medical examination without abnormal findings: Secondary | ICD-10-CM

## 2021-12-31 DIAGNOSIS — Z1329 Encounter for screening for other suspected endocrine disorder: Secondary | ICD-10-CM

## 2022-02-22 ENCOUNTER — Encounter: Payer: Self-pay | Admitting: Emergency Medicine

## 2022-02-22 ENCOUNTER — Emergency Department (HOSPITAL_COMMUNITY)
Admission: EM | Admit: 2022-02-22 | Discharge: 2022-02-22 | Disposition: A | Discharge status: Home / Self Care | Payer: Commercial Managed Care - PPO | Attending: Emergency Medicine | Admitting: Emergency Medicine

## 2022-02-22 ENCOUNTER — Ambulatory Visit
Admission: EM | Admit: 2022-02-22 | Discharge: 2022-02-22 | Disposition: A | Discharge status: Home / Self Care | Payer: Commercial Managed Care - PPO | Attending: Internal Medicine | Admitting: Internal Medicine

## 2022-02-22 ENCOUNTER — Encounter (HOSPITAL_COMMUNITY): Payer: Self-pay

## 2022-02-22 ENCOUNTER — Emergency Department (HOSPITAL_COMMUNITY): Payer: Commercial Managed Care - PPO

## 2022-02-22 ENCOUNTER — Other Ambulatory Visit: Payer: Self-pay

## 2022-02-22 DIAGNOSIS — R0789 Other chest pain: Secondary | ICD-10-CM | POA: Diagnosis not present

## 2022-02-22 DIAGNOSIS — R202 Paresthesia of skin: Secondary | ICD-10-CM | POA: Insufficient documentation

## 2022-02-22 DIAGNOSIS — J45909 Unspecified asthma, uncomplicated: Secondary | ICD-10-CM | POA: Insufficient documentation

## 2022-02-22 DIAGNOSIS — Z20822 Contact with and (suspected) exposure to covid-19: Secondary | ICD-10-CM | POA: Insufficient documentation

## 2022-02-22 DIAGNOSIS — R200 Anesthesia of skin: Secondary | ICD-10-CM | POA: Diagnosis not present

## 2022-02-22 LAB — RESP PANEL BY RT-PCR (FLU A&B, COVID) ARPGX2
Influenza A by PCR: NEGATIVE
Influenza B by PCR: NEGATIVE
SARS Coronavirus 2 by RT PCR: NEGATIVE

## 2022-02-22 LAB — CBC WITH DIFFERENTIAL/PLATELET
Abs Immature Granulocytes: 0.01 10*3/uL (ref 0.00–0.07)
Basophils Absolute: 0 10*3/uL (ref 0.0–0.1)
Basophils Relative: 1 %
Eosinophils Absolute: 0 10*3/uL (ref 0.0–0.5)
Eosinophils Relative: 1 %
HCT: 41 % (ref 39.0–52.0)
Hemoglobin: 13.4 g/dL (ref 13.0–17.0)
Immature Granulocytes: 0 %
Lymphocytes Relative: 33 %
Lymphs Abs: 1.2 10*3/uL (ref 0.7–4.0)
MCH: 30.1 pg (ref 26.0–34.0)
MCHC: 32.7 g/dL (ref 30.0–36.0)
MCV: 92.1 fL (ref 80.0–100.0)
Monocytes Absolute: 0.4 10*3/uL (ref 0.1–1.0)
Monocytes Relative: 11 %
Neutro Abs: 2 10*3/uL (ref 1.7–7.7)
Neutrophils Relative %: 54 %
Platelets: 357 10*3/uL (ref 150–400)
RBC: 4.45 MIL/uL (ref 4.22–5.81)
RDW: 13.6 % (ref 11.5–15.5)
WBC: 3.6 10*3/uL — ABNORMAL LOW (ref 4.0–10.5)
nRBC: 0 % (ref 0.0–0.2)

## 2022-02-22 LAB — BASIC METABOLIC PANEL
Anion gap: 7 (ref 5–15)
BUN: 17 mg/dL (ref 6–20)
CO2: 26 mmol/L (ref 22–32)
Calcium: 9.3 mg/dL (ref 8.9–10.3)
Chloride: 103 mmol/L (ref 98–111)
Creatinine, Ser: 1.1 mg/dL (ref 0.61–1.24)
GFR, Estimated: >60 mL/min (ref >60)
Glucose, Bld: 95 mg/dL (ref 70–99)
Potassium: 4.2 mmol/L (ref 3.5–5.1)
Sodium: 136 mmol/L (ref 135–145)

## 2022-02-22 LAB — TROPONIN I (HIGH SENSITIVITY)
Troponin I (High Sensitivity): 4 ng/L (ref <18)
Troponin I (High Sensitivity): 4 ng/L (ref <18)

## 2022-02-22 NOTE — ED Triage Notes (Signed)
Left sided arm, face, chest pain/numbness. Hx of same. Started 2 days ago ?

## 2022-02-22 NOTE — ED Provider Triage Note (Signed)
Emergency Medicine Provider Triage Evaluation Note ? ?Victor Burns , a 31 y.o. male  was evaluated in triage.  Pt complains of chest pain, numbness in left side of face and tingling in left arm, Sent from urgent care for further evaluation.  Symptoms present for 2 days, constant since this morning with pain in the left side of his chest as well as tingling and discomfort in his left arm and some left-sided facial numbness.  Has a history of intermittent chest pains, but reports facial numbness and tingling in the arm are new.  Has had prior evaluation with cardiology which has been reassuring. ? ?Review of Systems  ?Positive: Chest pain, facial numbness, arm tingling ?Negative: Shortness of breath, fever, cough, abdominal pai ? ?Physical Exam  ?BP (!) 147/85 (BP Location: Right Arm)   Pulse (!) 54   Temp 98.1 ?F (36.7 ?C) (Oral)   Resp 16   Ht 5\' 11"  (1.803 m)   Wt 108.9 kg   SpO2 100%   BMI 33.47 kg/m?  ?Gen:   Awake, no distress   ?Resp:  Normal effort, CTA bilat, left-sided chest tenderness. ?MSK:   Moves extremities without difficulty  ?Other:   ? ?Medical Decision Making  ?Medically screening exam initiated at 9:30 AM.  Appropriate orders placed.  Victor Burns was informed that the remainder of the evaluation will be completed by another provider, this initial triage assessment does not replace that evaluation, and the importance of remaining in the ED until their evaluation is complete. ? ? ?  ?Victor Burns, Victor Burns ?02/22/22 02/24/22 ? ?

## 2022-02-22 NOTE — ED Provider Notes (Signed)
?[facility] ? ? ? ?CSN: [id number] ?Arrival date & time: 02/22/22  [id number] ? ? ?  ? ?History   ?Chief Complaint ?Chief Complaint  ?Patient presents with  ? Tingling  ? ? ?HPI ?Victor Burns is a 31 y.o. male.  ? ?Patient presents with left-sided chest pain, left-sided arm numbness and tingling, and left-sided facial numbness that started approximately 2 days ago.  Patient reports history of intermittent chest pain and left-sided arm numbness and tingling that has been present intermittently over the past year.  He reports that he has never had left facial numbness and that his left arm sensation is slightly different than previous episodes.  Denies any associated shortness of breath, headache, dizziness, nausea, vomiting.  Has been seen by cardiology in September 2022 and had a negative echocardiogram as well as negative stress test.  He has no follow-up appointments with cardiology as he has been followed by primary care doctor for for any further evaluation given that tests were negative.  He also told cardiologist that he had a fall that subsequently started with the left arm pain/numbness/tingling. ? ? ? ?Past Medical History:  ?Diagnosis Date  ? Abscess   ? Arthritis   ? knee, back  ? Asthma   ? ? ?Patient Active Problem List  ? Diagnosis Date Noted  ? GERD (gastroesophageal reflux disease) 11/24/2021  ? Perirectal abscess s/p I&D 06/16/2018 06/16/2018  ? Alcohol abuse 06/16/2018  ? Mild persistent asthma 10/07/2017  ? ? ?Past Surgical History:  ?Procedure Laterality Date  ? EVALUATION UNDER ANESTHESIA WITH HEMORRHOIDECTOMY N/A 08/08/2019  ? Procedure: EXAM UNDER ANESTHESIA WITH IRRIGATION AND DRAINAGE OF PERIRECTAL ABSCESS;  Surgeon: Ileana Roup, MD;  Location: [facility];  Service: General;  Laterality: N/A;  ? FISTULOTOMY N/A 02/20/2020  ? Procedure: PARTIAL  FISTULOTOMY WITH PLACEMENT OF CUTTING TETON;  Surgeon: Ileana Roup, MD;  Location: [facility];  Service: General;  Laterality: N/A;  ?  INCISION AND DRAINAGE PERIRECTAL ABSCESS N/A 06/16/2018  ? Procedure: EXAM UNDER ANESTHESIA IRRIGATION AND DEBRIDEMENT PERIRECTAL ABSCESS;  Surgeon: Rolm Bookbinder, MD;  Location: [facility];  Service: General;  Laterality: N/A;  ? KNEE ARTHROSCOPY    ? PLACEMENT OF SETON  08/08/2019  ? Procedure: PLACEMENT OF DRAINING SETON;  Surgeon: Ileana Roup, MD;  Location: [facility];  Service: General;;  ? RECTAL EXAM UNDER ANESTHESIA N/A 02/20/2020  ? Procedure: ANORECTAL EXAM UNDER ANESTHESIA;  Surgeon: Ileana Roup, MD;  Location: [facility];  Service: General;  Laterality: N/A;  ? ? ? ? ? ?Home Medications   ? ?Prior to Admission medications   ?Medication Sig Start Date End Date Taking? Authorizing Provider  ?acetaminophen (TYLENOL) 500 MG tablet Take 500 mg by mouth every 6 (six) hours as needed.    [provider]  ?albuterol (VENTOLIN HFA) 108 (90 Base) MCG/ACT inhaler Inhale 1-2 puffs into the lungs every 6 (six) hours as needed for wheezing or shortness of breath. 10/18/21   Hughie Closs, PA-C  ?budesonide-formoterol (SYMBICORT) 160-4.5 MCG/ACT inhaler Inhale 2 puffs into the lungs in the morning and at bedtime. 12/18/21   Mannam, Hart Robinsons, MD  ?ibuprofen (ADVIL) 600 MG tablet Take 600 mg by mouth every 6 (six) hours as needed.    [provider]  ?omeprazole (PRILOSEC) 20 MG capsule Take 1 capsule (20 mg total) by mouth daily. ?Patient not taking: Reported on 12/18/2021 11/24/21 03/24/22  Camillia Herter, NP  ? ? ?Family History ?Family History  ?Problem  Relation Age of Onset  ? Hypertension Mother   ? Hypertension Father   ? ? ?Social History ?Social History  ? ?Tobacco Use  ? Smoking status: Some Days  ?  Packs/day: 0.25  ?  Years: 10.00  ?  Pack years: 2.50  ?  Types: Cigarettes  ? Smokeless tobacco: Never  ? Tobacco comments:  ?  Smokes on weekends   ?Vaping Use  ? Vaping Use: Never used  ?Substance Use Topics  ? Alcohol use: Yes  ?  Alcohol/week: 10.0 standard drinks  ?  Types: 10 Cans  of beer per week  ?  Comment: 2 bottles liquor/ per wk  ? Drug use: Yes  ?  Types: Marijuana, Cocaine  ?  Comment: Pt reports smoking every other day (2g)  ? ? ? ?Allergies   ?Bee venom, Shellfish allergy, Compazine [prochlorperazine], and Other ? ? ?Review of Systems ?Review of Systems ?Per HPI ? ?Physical Exam ?Triage Vital Signs ?ED Triage Vitals  ?Enc Vitals Group  ?   BP 02/22/22 0835 (!) 151/83  ?   Pulse Rate 02/22/22 0835 (!) 54  ?   Resp 02/22/22 0835 16  ?   Temp 02/22/22 0835 98.3 ?F (36.8 ?C)  ?   Temp Source 02/22/22 0835 Oral  ?   SpO2 02/22/22 0835 96 %  ?   Weight --   ?   Height --   ?   Head Circumference --   ?   Peak Flow --   ?   Pain Score 02/22/22 0836 7  ?   Pain Loc --   ?   Pain Edu? --   ?   Excl. in [place]? --   ? ?No data found. ? ?Updated Vital Signs ?BP (!) 151/83 (BP Location: Left Arm)   Pulse (!) 54   Temp 98.3 ?F (36.8 ?C) (Oral)   Resp 16   SpO2 96%  ? ?Visual Acuity ?Right Eye Distance:   ?Left Eye Distance:   ?Bilateral Distance:   ? ?Right Eye Near:   ?Left Eye Near:    ?Bilateral Near:    ? ?Physical Exam ?Constitutional:   ?   General: He is not in acute distress. ?   Appearance: Normal appearance. He is not toxic-appearing or diaphoretic.  ?HENT:  ?   Head: Normocephalic and atraumatic.  ?Eyes:  ?   Extraocular Movements: Extraocular movements intact.  ?   Conjunctiva/sclera: Conjunctivae normal.  ?   Pupils: Pupils are equal, round, and reactive to light.  ?Cardiovascular:  ?   Rate and Rhythm: Normal rate and regular rhythm.  ?   Pulses: Normal pulses.  ?   Heart sounds: Normal heart sounds.  ?Pulmonary:  ?   Effort: Pulmonary effort is normal. No respiratory distress.  ?   Breath sounds: Normal breath sounds.  ?Neurological:  ?   General: No focal deficit present.  ?   Mental Status: He is alert and oriented to person, place, and time. Mental status is at baseline.  ?   Cranial Nerves: Cranial nerves 2-12 are intact.  ?   Sensory: Sensation is intact.  ?   Motor: Motor  function is intact.  ?   Coordination: Coordination is intact.  ?   Gait: Gait is intact.  ?   Comments: No facial droop noted.  ?Psychiatric:     ?   Mood and Affect: Mood normal.     ?   Behavior: Behavior normal.     ?  Thought Content: Thought content normal.     ?   Judgment: Judgment normal.  ? ? ? ?UC Treatments / Results  ?Labs ?(all labs ordered are listed, but only abnormal results are displayed) ?Labs Reviewed - No data to display ? ?EKG ? ? ?Radiology ?No results found. ? ?Procedures ?Procedures (including critical care time) ? ?Medications Ordered in UC ?Medications - No data to display ? ?Initial Impression / Assessment and Plan / UC Course  ?I have reviewed the triage vital signs and the nursing notes. ? ?Pertinent labs & imaging results that were available during my care of the patient were reviewed by me and considered in my medical decision making (see chart for details). ? ?  ? ?Patient's chest pain and left arm sensation is most likely from his chronic intermittent chest pain and left arm paresthesias.  Although, patient reports that it feels different than his typical pain and he has never had left facial numbness before.  Therefore, patient was advised to go to the hospital for more extensive evaluation than can be provided at the urgent care.  EG was fairly unremarkable.  Neuro exam was normal.  Patient left via self transport. ?Final Clinical Impressions(s) / UC Diagnoses  ? ?Final diagnoses:  ?Left facial numbness  ?Arm paresthesia, left  ?Other chest pain  ? ? ? ?Discharge Instructions   ? ?  ?Please go to the emergency department as soon as you leave urgent care for further evaluation and management. ? ? ? ?ED Prescriptions   ?None ?  ? ?PDMP not reviewed this encounter. ?  ?Teodora Medici, [place] ?02/22/22 [id number] ? ?

## 2022-02-22 NOTE — Discharge Instructions (Signed)
Please go to the emergency department as soon as you leave urgent care for further evaluation and management. ?

## 2022-02-22 NOTE — Discharge Instructions (Addendum)
You were evaluated in the Emergency Department and after careful evaluation, we did not find any emergent condition requiring admission or further testing in the hospital. ? ?Your exam/testing today was overall reassuring.  Your CT imaging of the head and cervical spine were unremarkable.  No evidence of intracranial bleeding or fracture or malalignment in your cervical spine.  Your neurologic exam is completely normal.  Low suspicion for acute stroke. Your COVID-19 and influenza PCR testing was negative.  Your cardiac enzymes are normal. You do have chest wall tenderness to palpation in your left axilla.  Recommend high-dose NSAIDs for the next few days for this.  If you develop burning epigastric pain radiating to your back, worse with lying flat, this could be a symptom of reflux and I would stop the NSAIDs.  Follow-up with your PCP to discuss your symptoms. ? ?Please return to the Emergency Department if you experience any worsening of your condition.  Thank you for allowing [place] to be a part of your care. ? ?

## 2022-02-22 NOTE — Progress Notes (Deleted)
? ? ?Patient ID: Victor Burns, male    DOB: August 08, 1991  MRN: [id number] ? ?CC: Abdominal Pain ? ?Subjective: ?Victor Burns is a 31 y.o. male who presents for abdominal pain.  ? ?His concerns today include: *** ? ?Patient Active Problem List  ? Diagnosis Date Noted  ? GERD (gastroesophageal reflux disease) 11/24/2021  ? Perirectal abscess s/p I&D 06/16/2018 06/16/2018  ? Alcohol abuse 06/16/2018  ? Mild persistent asthma 10/07/2017  ?  ? ?Current Outpatient Medications on File Prior to Visit  ?Medication Sig Dispense Refill  ? acetaminophen (TYLENOL) 500 MG tablet Take 500 mg by mouth every 6 (six) hours as needed.    ? albuterol (VENTOLIN HFA) 108 (90 Base) MCG/ACT inhaler Inhale 1-2 puffs into the lungs every 6 (six) hours as needed for wheezing or shortness of breath. 18 g 0  ? budesonide-formoterol (SYMBICORT) 160-4.5 MCG/ACT inhaler Inhale 2 puffs into the lungs in the morning and at bedtime. 10.2 g 5  ? ibuprofen (ADVIL) 600 MG tablet Take 600 mg by mouth every 6 (six) hours as needed.    ? omeprazole (PRILOSEC) 20 MG capsule Take 1 capsule (20 mg total) by mouth daily. (Patient not taking: Reported on 12/18/2021) 120 capsule 0  ? ?No current facility-administered medications on file prior to visit.  ? ? ?Allergies  ?Allergen Reactions  ? Bee Venom Anaphylaxis  ? Shellfish Allergy Anaphylaxis  ? Compazine [Prochlorperazine] Other (See Comments)  ?  Trembling, feeling hot.   ? Other Itching  ?  CHG wipes  ? ? ?Social History  ? ?Socioeconomic History  ? Marital status: Single  ?  Spouse name: Not on file  ? Number of children: Not on file  ? Years of education: Not on file  ? Highest education level: Not on file  ?Occupational History  ? Not on file  ?Tobacco Use  ? Smoking status: Some Days  ?  Packs/day: 0.25  ?  Years: 10.00  ?  Pack years: 2.50  ?  Types: Cigarettes  ? Smokeless tobacco: Never  ? Tobacco comments:  ?  Smokes on weekends   ?Vaping Use  ? Vaping Use: Never used  ?Substance and Sexual Activity   ? Alcohol use: Yes  ?  Alcohol/week: 10.0 standard drinks  ?  Types: 10 Cans of beer per week  ?  Comment: 2 bottles liquor/ per wk  ? Drug use: Yes  ?  Types: Marijuana, Cocaine  ?  Comment: Pt reports smoking every other day (2g)  ? Sexual activity: Not on file  ?Other Topics Concern  ? Not on file  ?Social History Narrative  ? ** Merged History Encounter **  ?    ? ?Social Determinants of Health  ? ?Financial Resource Strain: Not on file  ?Food Insecurity: Not on file  ?Transportation Needs: Not on file  ?Physical Activity: Not on file  ?Stress: Not on file  ?Social Connections: Not on file  ?Intimate Partner Violence: Not on file  ? ? ?Family History  ?Problem Relation Age of Onset  ? Hypertension Mother   ? Hypertension Father   ? ? ?Past Surgical History:  ?Procedure Laterality Date  ? EVALUATION UNDER ANESTHESIA WITH HEMORRHOIDECTOMY N/A 08/08/2019  ? Procedure: EXAM UNDER ANESTHESIA WITH IRRIGATION AND DRAINAGE OF PERIRECTAL ABSCESS;  Surgeon: Andria Meuse, MD;  Location: [facility];  Service: General;  Laterality: N/A;  ? FISTULOTOMY N/A 02/20/2020  ? Procedure: PARTIAL  FISTULOTOMY WITH PLACEMENT OF CUTTING TETON;  Surgeon: Marin Olp  M, MD;  Location: [facility];  Service: General;  Laterality: N/A;  ? INCISION AND DRAINAGE PERIRECTAL ABSCESS N/A 06/16/2018  ? Procedure: EXAM UNDER ANESTHESIA IRRIGATION AND DEBRIDEMENT PERIRECTAL ABSCESS;  Surgeon: Emelia Loron, MD;  Location: [facility];  Service: General;  Laterality: N/A;  ? KNEE ARTHROSCOPY    ? PLACEMENT OF SETON  08/08/2019  ? Procedure: PLACEMENT OF DRAINING SETON;  Surgeon: Andria Meuse, MD;  Location: [facility];  Service: General;;  ? RECTAL EXAM UNDER ANESTHESIA N/A 02/20/2020  ? Procedure: ANORECTAL EXAM UNDER ANESTHESIA;  Surgeon: Andria Meuse, MD;  Location: [facility];  Service: General;  Laterality: N/A;  ? ? ?ROS: ?Review of Systems ?Negative except as stated above ? ?PHYSICAL EXAM: ?There were no vitals taken for this  visit. ? ?Physical Exam ? ?{male adult master:310786} ?{male adult master:310785} ? ?CMP Latest Ref Rng & Units 02/22/2022 09/12/2021 07/11/2021  ?Glucose 70 - 99 mg/dL 95 95 469(G)  ?BUN 6 - 20 mg/dL 17 13 17   ?Creatinine 0.61 - 1.24 mg/dL 2.95 2.84  ?Sodium 135 - 145 mmol/L 136 138 136  ?Potassium 3.5 - 5.1 mmol/L 4.2 4.1 3.4(L)  ?Chloride 98 - 111 mmol/L 103 105 102  ?CO2 22 - 32 mmol/L 26 25 22   ?Calcium 8.9 - 10.3 mg/dL 9.3 9.5 9.4  ?Total Protein 6.5 - 8.1 g/dL - - -  ?Total Bilirubin 0.3 - 1.2 mg/dL - - -  ?Alkaline Phos 38 - 126 U/L - - -  ?AST 15 - 41 U/L - - -  ?ALT 0 - 44 U/L - - -  ? ?Lipid Panel  ?No results found for: CHOL, TRIG, HDL, CHOLHDL, VLDL, LDLCALC, LDLDIRECT ? ?CBC ?   ?Component Value Date/Time  ? WBC 3.6 (L) 02/22/2022 0957  ? RBC 4.45 02/22/2022 0957  ? HGB 13.4 02/22/2022 0957  ? HCT 41.0 02/22/2022 0957  ? PLT 357 02/22/2022 0957  ? MCV 92.1 02/22/2022 0957  ? MCV 89.9 05/16/2018 1007  ? MCH 30.1 02/22/2022 0957  ? MCHC 32.7 02/22/2022 0957  ? RDW 13.6 02/22/2022 0957  ? LYMPHSABS 1.2 02/22/2022 0957  ? MONOABS 0.4 02/22/2022 0957  ? EOSABS 0.0 02/22/2022 0957  ? BASOSABS 0.0 02/22/2022 0957  ? ? ?ASSESSMENT AND PLAN: ? ?There are no diagnoses linked to this encounter. ? ? ?Patient was given the opportunity to ask questions.  Patient verbalized understanding of the plan and was able to repeat key elements of the plan. Patient was given clear instructions to go to Emergency Department or return to medical center if symptoms don't improve, worsen, or new problems develop.The patient verbalized understanding. ? ? ?No orders of the defined types were placed in this encounter. ? ? ? ?Requested Prescriptions  ? ? No prescriptions requested or ordered in this encounter  ? ? ?No follow-ups on file. ? ?02/24/2022, NP  ?

## 2022-02-22 NOTE — Progress Notes (Signed)
Erroneous encounter

## 2022-02-22 NOTE — ED Triage Notes (Signed)
Pt presents with c/o chest pain and facial numbness on the left side. Pt reports the numbness is always down his left arm. Pt reports these symptoms started Saturday.  ?

## 2022-02-22 NOTE — ED Provider Notes (Addendum)
?[facility]-EMERGENCY DEPT ?Provider Note ? ? ?CSN: [id number] ?Arrival date & time: 02/22/22  0908 ? ?  ? ?History ? ?Chief Complaint  ?Patient presents with  ? Chest Pain  ? facial numbness  ? ? ?Author Hatlestad is a 31 y.o. male. ? ? ?Chest Pain ?Associated symptoms: numbness   ? ?31 year old male with medical history significant for asthma, arthritis who presents to the emergency department with a chief complaint of chest pain.  The patient endorses paresthesias and intermittent numbness in his left arm.  He states he has chronic issues with numbness down his left arm.  He endorses some subjective left-sided perioral numbness in his left face that started 2 days ago and has been intermittent.  He states that the symptoms have intermittently occurred over the past year.  He denies any shortness of breath, headache, facial droop, weakness, nausea, vomiting.  He states that his chest pain has improved since arrival.  He describes it as a tightness. ? ?Home Medications ?Prior to Admission medications   ?Medication Sig Start Date End Date Taking? Authorizing Provider  ?acetaminophen (TYLENOL) 500 MG tablet Take 500 mg by mouth every 6 (six) hours as needed.    [provider]  ?albuterol (VENTOLIN HFA) 108 (90 Base) MCG/ACT inhaler Inhale 1-2 puffs into the lungs every 6 (six) hours as needed for wheezing or shortness of breath. 10/18/21   Rushie Chestnut, PA-C  ?budesonide-formoterol (SYMBICORT) 160-4.5 MCG/ACT inhaler Inhale 2 puffs into the lungs in the morning and at bedtime. 12/18/21   Mannam, Colbert Coyer, MD  ?ibuprofen (ADVIL) 600 MG tablet Take 600 mg by mouth every 6 (six) hours as needed.    [provider]  ?omeprazole (PRILOSEC) 20 MG capsule Take 1 capsule (20 mg total) by mouth daily. ?Patient not taking: Reported on 12/18/2021 11/24/21 03/24/22  Rema Fendt, NP  ?   ? ?Allergies    ?Bee venom, Shellfish allergy, Compazine [prochlorperazine], and Other   ? ?Review of  Systems   ?Review of Systems  ?Cardiovascular:  Positive for chest pain.  ?Neurological:  Positive for numbness.  ?All other systems reviewed and are negative. ? ?Physical Exam ?Updated Vital Signs ?BP 125/87   Pulse (!) 55   Temp 98.1 ?F (36.7 ?C) (Oral)   Resp 13   Ht 5\' 11"  (1.803 m)   Wt 108.9 kg   SpO2 97%   BMI 33.47 kg/m?  ?Physical Exam ?Vitals and nursing note reviewed.  ?Constitutional:   ?   General: He is not in acute distress. ?   Appearance: He is well-developed.  ?HENT:  ?   Head: Normocephalic and atraumatic.  ?Eyes:  ?   Conjunctiva/sclera: Conjunctivae normal.  ?Neck:  ?   Comments: Negative Spurling sign, Mild tenderness to palpation of the cervical spine. ?Cardiovascular:  ?   Rate and Rhythm: Normal rate and regular rhythm.  ?   Heart sounds: No murmur heard. ?Pulmonary:  ?   Effort: Pulmonary effort is normal. No respiratory distress.  ?   Breath sounds: Normal breath sounds.  ?Chest:  ?   Comments: Left axilla tenderness to palpation ?Abdominal:  ?   Palpations: Abdomen is soft.  ?   Tenderness: There is no abdominal tenderness.  ?Musculoskeletal:     ?   General: No swelling.  ?   Cervical back: Neck supple.  ?   Comments: Negative straight leg raise test bilaterally.  No midline tenderness to palpation of the thoracic or lumbar spine.  ?  Skin: ?   General: Skin is warm and dry.  ?   Capillary Refill: Capillary refill takes less than 2 seconds.  ?Neurological:  ?   Mental Status: He is alert.  ?   Comments: MENTAL STATUS EXAM:    ?Orientation: Alert and oriented to person, place and time.  ?Memory: Cooperative, follows commands well.  ?Language: Speech is clear and language is normal.  ? ?CRANIAL NERVES:    ?CN 2 (Optic): Visual fields intact to confrontation.  ?CN 3,4,6 (EOM): Pupils equal and reactive to light. Full extraocular eye movement without nystagmus.  ?CN 5 (Trigeminal): Facial sensation is normal, no weakness of masticatory muscles.  ?CN 7 (Facial): No facial weakness or  asymmetry.  ?CN 8 (Auditory): Auditory acuity grossly normal.  ?CN 9,10 (Glossophar): The uvula is midline, the palate elevates symmetrically.  ?CN 11 (spinal access): Normal sternocleidomastoid and trapezius strength.  ?CN 12 (Hypoglossal): The tongue is midline. No atrophy or fasciculations..  ? ?MOTOR:  Muscle Strength: 5/5RUE, 5/5LUE, 5/5RLE, 5/5LLE.  ? ?COORDINATION:   Intact finger-to-nose, no tremor.  ? ?SENSATION:   Intact to light touch all four extremities. ? ?GAIT: Gait normal without ataxia ?  ?Psychiatric:     ?   Mood and Affect: Mood normal.  ? ? ?ED Results / Procedures / Treatments   ?Labs ?(all labs ordered are listed, but only abnormal results are displayed) ?Labs Reviewed  ?CBC WITH DIFFERENTIAL/PLATELET - Abnormal; Notable for the following components:  ?    Result Value  ? WBC 3.6 (*)   ? All other components within normal limits  ?RESP PANEL BY RT-PCR (FLU A&B, COVID) [id number]  ?BASIC METABOLIC PANEL  ?TROPONIN I (HIGH SENSITIVITY)  ?TROPONIN I (HIGH SENSITIVITY)  ? ? ?EKG ?EKG Interpretation ? ?Date/Time:  Monday February 22 2022 09:14:58 EDT ?Ventricular Rate:  53 ?PR Interval:  160 ?QRS Duration: 88 ?QT Interval:  420 ?QTC Calculation: 395 ?R Axis:   66 ?Text Interpretation: Sinus rhythm Benign early repolarization Confirmed by Ernie Avena ([id number]) on 02/22/2022 11:19:06 AM ? ?Radiology ?DG Chest 2 View ? ?Result Date: 02/22/2022 ?CLINICAL DATA:  Chest pain EXAM: CHEST - 2 VIEW COMPARISON:  Chest x-ray 11/24/2021 FINDINGS: Heart size and mediastinal contours are within normal limits. No suspicious pulmonary opacities identified. No pleural effusion or pneumothorax visualized. No acute osseous abnormality appreciated. IMPRESSION: No acute intrathoracic process identified. Electronically Signed   By: Jannifer Hick M.D.   On: 02/22/2022 10:11  ? ?CT HEAD WO CONTRAST ( ) ? ?Result Date: 02/22/2022 ?CLINICAL DATA:  Neurological deficit, left-sided numbness EXAM: CT HEAD WITHOUT CONTRAST TECHNIQUE:  Contiguous axial images were obtained from the base of the skull through the vertex without intravenous contrast. RADIATION DOSE REDUCTION: This exam was performed according to the departmental dose-optimization program which includes automated exposure control, adjustment of the mA and/or kV according to patient size and/or use of iterative reconstruction technique. COMPARISON:  09/12/2021 FINDINGS: Brain: No acute intracranial findings are seen. Ventricles are not dilated. There is no shift of midline structures. There are no epidural or subdural fluid collections. Vascular: Unremarkable. Skull: Unremarkable. Sinuses/Orbits: There is mild mucosal thickening in the ethmoid sinus. Other: None IMPRESSION: No acute intracranial findings are seen in the noncontrast CT brain. No significant interval changes are noted since 09/12/2021. Electronically Signed   By: Ernie Avena M.D.   On: 02/22/2022 14:37  ? ?CT Cervical Spine Wo Contrast ? ?Result Date: 02/22/2022 ?CLINICAL DATA:  Left sided numbness, neck pain EXAM: CT CERVICAL  SPINE WITHOUT CONTRAST TECHNIQUE: Multidetector CT imaging of the cervical spine was performed without intravenous contrast. Multiplanar CT image reconstructions were also generated. RADIATION DOSE REDUCTION: This exam was performed according to the departmental dose-optimization program which includes automated exposure control, adjustment of the mA and/or kV according to patient size and/or use of iterative reconstruction technique. COMPARISON:  08/10/2016 FINDINGS: Alignment: Alignment of posterior margins of vertebral bodies is unremarkable. Skull base and vertebrae: No recent fracture is seen. There is no significant disc space narrowing. Soft tissues and spinal canal: There is no central spinal stenosis. Disc levels: There is no significant encroachment of neural foramina. Upper chest: Unremarkable. Other: None IMPRESSION: No recent fracture is seen in the cervical spine. Alignment of  posterior margins of vertebral bodies is unremarkable. There is no significant encroachment of neural foramina. Electronically Signed   By: Ernie AvenaPalani  Rathinasamy M.D.   On: 02/22/2022 14:41   ? ?Procedures ?Proce

## 2022-02-23 ENCOUNTER — Encounter: Payer: Commercial Managed Care - PPO | Admitting: Family

## 2022-02-23 ENCOUNTER — Ambulatory Visit: Payer: Commercial Managed Care - PPO | Admitting: Family

## 2022-03-09 ENCOUNTER — Ambulatory Visit: Payer: Commercial Managed Care - PPO | Admitting: Pulmonary Disease

## 2022-03-20 ENCOUNTER — Ambulatory Visit (HOSPITAL_COMMUNITY)
Admission: EM | Admit: 2022-03-20 | Discharge: 2022-03-20 | Disposition: A | Discharge status: Home / Self Care | Payer: Commercial Managed Care - PPO | Attending: Nurse Practitioner | Admitting: Nurse Practitioner

## 2022-03-20 ENCOUNTER — Other Ambulatory Visit: Payer: Self-pay

## 2022-03-20 ENCOUNTER — Encounter (HOSPITAL_COMMUNITY): Payer: Self-pay | Admitting: *Deleted

## 2022-03-20 DIAGNOSIS — J4521 Mild intermittent asthma with (acute) exacerbation: Secondary | ICD-10-CM

## 2022-03-20 MED ORDER — ALBUTEROL SULFATE (2.5 MG/3ML) 0.083% IN NEBU
2.5000 mg | INHALATION_SOLUTION | RESPIRATORY_TRACT | Status: AC
  Administered 2022-03-20: 2.5 mg via RESPIRATORY_TRACT

## 2022-03-20 MED ORDER — ALBUTEROL SULFATE (2.5 MG/3ML) 0.083% IN NEBU
INHALATION_SOLUTION | RESPIRATORY_TRACT | Status: AC
  Filled 2022-03-20: qty 3

## 2022-03-20 NOTE — Discharge Instructions (Addendum)
Take medication as prescribed. ?Avoid your asthma triggers as much as possible. ?Increase fluids and get plenty of rest. ?You may use your albuterol nebulizer as needed at home. ?Follow-up if you have worsening shortness of breath difficulty breathing.  If you are unable to speak in a complete sentence, or worsening chest tightness, go to the ER. ?

## 2022-03-20 NOTE — ED Provider Notes (Signed)
?[place] ? ? ? ?CSN: [id number] ?Arrival date & time: 03/20/22  1750 ? ? ?  ? ?History   ?Chief Complaint ?Chief Complaint  ?Patient presents with  ? Shortness of Breath  ? ? ?HPI ?Victor Burns is a 31 y.o. male.  ? ?The patient is a 31 year old male who presents with shortness of breath and wheezing.  Patient states he has had intermittent chest tightness as well.  He denies fever, chills, nasal congestion, runny nose, or GI symptoms.  He states that he has been outside more over the past several days.  He noticed that this morning upon awakening he was having some shortness of breath.  He has been using his asthma inhaler, but states that it was about to expire so he was wondering if that was why it was not working as well.  He denies any recent upper upper respiratory infections at this time. ? ?The history is provided by the patient.  ? ?Past Medical History:  ?Diagnosis Date  ? Abscess   ? Arthritis   ? knee, back  ? Asthma   ? ? ?Patient Active Problem List  ? Diagnosis Date Noted  ? GERD (gastroesophageal reflux disease) 11/24/2021  ? Perirectal abscess s/p I&D 06/16/2018 06/16/2018  ? Alcohol abuse 06/16/2018  ? Mild persistent asthma 10/07/2017  ? ? ?Past Surgical History:  ?Procedure Laterality Date  ? EVALUATION UNDER ANESTHESIA WITH HEMORRHOIDECTOMY N/A 08/08/2019  ? Procedure: EXAM UNDER ANESTHESIA WITH IRRIGATION AND DRAINAGE OF PERIRECTAL ABSCESS;  Surgeon: Ileana Roup, MD;  Location: [facility];  Service: General;  Laterality: N/A;  ? FISTULOTOMY N/A 02/20/2020  ? Procedure: PARTIAL  FISTULOTOMY WITH PLACEMENT OF CUTTING TETON;  Surgeon: Ileana Roup, MD;  Location: [facility];  Service: General;  Laterality: N/A;  ? INCISION AND DRAINAGE PERIRECTAL ABSCESS N/A 06/16/2018  ? Procedure: EXAM UNDER ANESTHESIA IRRIGATION AND DEBRIDEMENT PERIRECTAL ABSCESS;  Surgeon: Rolm Bookbinder, MD;  Location: [facility];  Service: General;  Laterality: N/A;  ? KNEE ARTHROSCOPY    ? PLACEMENT OF  SETON  08/08/2019  ? Procedure: PLACEMENT OF DRAINING SETON;  Surgeon: Ileana Roup, MD;  Location: [facility];  Service: General;;  ? RECTAL EXAM UNDER ANESTHESIA N/A 02/20/2020  ? Procedure: ANORECTAL EXAM UNDER ANESTHESIA;  Surgeon: Ileana Roup, MD;  Location: [facility];  Service: General;  Laterality: N/A;  ? ? ? ? ? ?Home Medications   ? ?Prior to Admission medications   ?Medication Sig Start Date End Date Taking? Authorizing Provider  ?acetaminophen (TYLENOL) 500 MG tablet Take 500 mg by mouth every 6 (six) hours as needed.    [provider]  ?albuterol (VENTOLIN HFA) 108 (90 Base) MCG/ACT inhaler Inhale 1-2 puffs into the lungs every 6 (six) hours as needed for wheezing or shortness of breath. 10/18/21   Hughie Closs, PA-C  ?budesonide-formoterol (SYMBICORT) 160-4.5 MCG/ACT inhaler Inhale 2 puffs into the lungs in the morning and at bedtime. 12/18/21   Mannam, Hart Robinsons, MD  ?ibuprofen (ADVIL) 600 MG tablet Take 600 mg by mouth every 6 (six) hours as needed.    [provider]  ?omeprazole (PRILOSEC) 20 MG capsule Take 1 capsule (20 mg total) by mouth daily. ?Patient not taking: Reported on 12/18/2021 11/24/21 03/24/22  Camillia Herter, NP  ? ? ?Family History ?Family History  ?Problem Relation Age of Onset  ? Hypertension Mother   ? Hypertension Father   ? ? ?Social History ?Social History  ? ?Tobacco Use  ?  Smoking status: Some Days  ?  Packs/day: 0.25  ?  Years: 10.00  ?  Pack years: 2.50  ?  Types: Cigarettes  ? Smokeless tobacco: Never  ? Tobacco comments:  ?  Smokes on weekends   ?Vaping Use  ? Vaping Use: Never used  ?Substance Use Topics  ? Alcohol use: Yes  ?  Alcohol/week: 10.0 standard drinks  ?  Types: 10 Cans of beer per week  ?  Comment: 2 bottles liquor/ per wk  ? Drug use: Yes  ?  Types: Marijuana, Cocaine  ?  Comment: Pt reports smoking every other day (2g)  ? ? ? ?Allergies   ?Bee venom, Shellfish allergy, Compazine [prochlorperazine], and Other ? ? ?Review of  Systems ?Review of Systems  ?Constitutional: Negative.   ?Cardiovascular: Negative.   ?Gastrointestinal: Negative.   ?Skin: Negative.   ?Psychiatric/Behavioral: Negative.    ? ? ?Physical Exam ?Triage Vital Signs ?ED Triage Vitals  ?Enc Vitals Group  ?   BP 03/20/22 1824 123/78  ?   Pulse Rate 03/20/22 1824 (!) 58  ?   Resp 03/20/22 1824 18  ?   Temp 03/20/22 1824 98.4 ?F (36.9 ?C)  ?   Temp src --   ?   SpO2 03/20/22 1824 96 %  ?   Weight --   ?   Height --   ?   Head Circumference --   ?   Peak Flow --   ?   Pain Score 03/20/22 1822 0  ?   Pain Loc --   ?   Pain Edu? --   ?   Excl. in [place]? --   ? ?No data found. ? ?Updated Vital Signs ?BP 123/78   Pulse (!) 58   Temp 98.4 ?F (36.9 ?C)   Resp 18   SpO2 96%  ? ?Visual Acuity ?Right Eye Distance:   ?Left Eye Distance:   ?Bilateral Distance:   ? ?Right Eye Near:   ?Left Eye Near:    ?Bilateral Near:    ? ?Physical Exam ?Vitals reviewed.  ?Constitutional:   ?   General: He is not in acute distress. ?   Appearance: He is well-developed.  ?HENT:  ?   Head: Normocephalic and atraumatic.  ?Eyes:  ?   Extraocular Movements: Extraocular movements intact.  ?   Pupils: Pupils are equal, round, and reactive to light.  ?Cardiovascular:  ?   Rate and Rhythm: Normal rate and regular rhythm.  ?Pulmonary:  ?   Effort: Pulmonary effort is normal.  ?   Breath sounds: Examination of the right-lower field reveals decreased breath sounds. Decreased breath sounds present. No wheezing or rales.  ?Abdominal:  ?   General: Bowel sounds are normal.  ?   Palpations: Abdomen is soft.  ?Musculoskeletal:     ?   General: Normal range of motion.  ?   Cervical back: Normal range of motion and neck supple.  ?Skin: ?   General: Skin is warm and dry.  ?   Capillary Refill: Capillary refill takes less than 2 seconds.  ?Neurological:  ?   General: No focal deficit present.  ?   Mental Status: He is alert and oriented to person, place, and time.  ?Psychiatric:     ?   Mood and Affect: Mood normal.      ?   Behavior: Behavior normal.  ? ? ? ?UC Treatments / Results  ?Labs ?(all labs ordered are listed, but only abnormal results  are displayed) ?Labs Reviewed - No data to display ? ?EKG ? ? ?Radiology ?No results found. ? ?Procedures ?Procedures (including critical care time) ? ?Medications Ordered in UC ?Medications  ?albuterol (PROVENTIL) (2.5 MG/3ML) 0.083% nebulizer solution 2.5 mg (2.5 mg Nebulization Given 03/20/22 1906)  ? ? ?Initial Impression / Assessment and Plan / UC Course  ?I have reviewed the triage vital signs and the nursing notes. ? ?Pertinent labs & imaging results that were available during my care of the patient were reviewed by me and considered in my medical decision making (see chart for details). ? ?Patient is a 31 year old male who presents for asthma symptoms.  Symptoms started today with shortness of breath and chest tightness.  Patient also admits to intermittent wheezing.  He has been using his inhaler without good relief.  On exam, he does have decreased breath sounds in the posterior right lower lobe.  He is in no acute distress, he is speaking in complete sentences.  Symptoms are consistent with a asthma exacerbation.  We will provide patient with an albuterol nebulizer to go home with tonight.  We will also provide prednisone for about 5 days.  Patient advised to avoid his asthma triggers as much as possible.  Follow-up if worsening shortness of breath, difficulty breathing or other concerns. ?Final Clinical Impressions(s) / UC Diagnoses  ? ?Final diagnoses:  ?Mild intermittent asthma with exacerbation  ? ? ? ?Discharge Instructions   ? ?  ?Take medication as prescribed. ?Avoid your asthma triggers as much as possible. ?Increase fluids and get plenty of rest. ?You may use your albuterol nebulizer as needed at home. ?Follow-up if you have worsening shortness of breath difficulty breathing.  If you are unable to speak in a complete sentence, or worsening chest tightness, go to the  ER. ? ? ? ? ?ED Prescriptions   ?None ?  ? ?PDMP not reviewed this encounter. ?  ?Tish Men, NP ?03/20/22 1919 ? ?

## 2022-03-20 NOTE — ED Triage Notes (Signed)
Pt reports [facility]OB today with asthma. Pt needs refill on [facility]N and is asking for a neb treatment. ?

## 2022-03-26 IMAGING — CT CT HEAD W/O CM
3 series · 14 of 47 positions shown, 16 images · non-contrast
Comparison: 09/12/2021

CLINICAL DATA: Neurological deficit, left-sided numbness



[Series 3: head wo · axial · 0.47mm/px · z∈[+302,+446]mm · 8 of 35 slices shown, 10 images]
[im 3/35  brain]
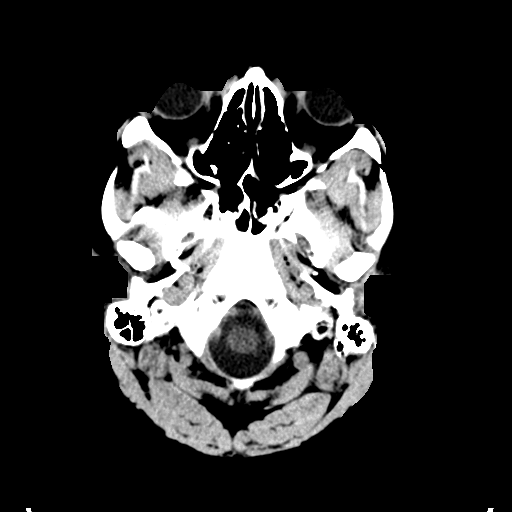
[im 3/35  bone]
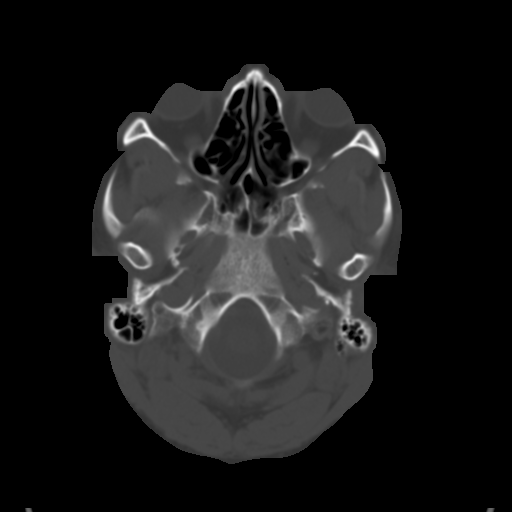
[im 8/35  brain]
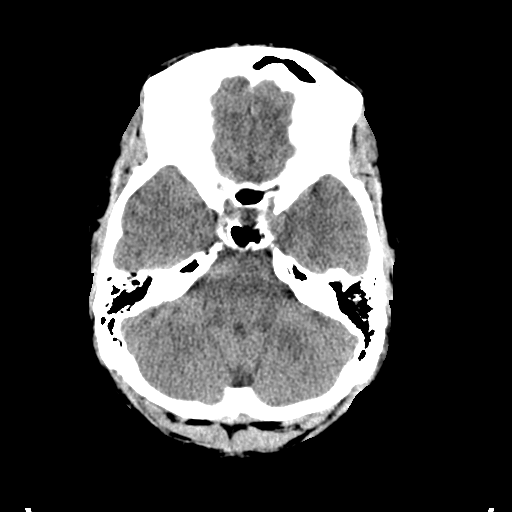
[im 11/35  brain]
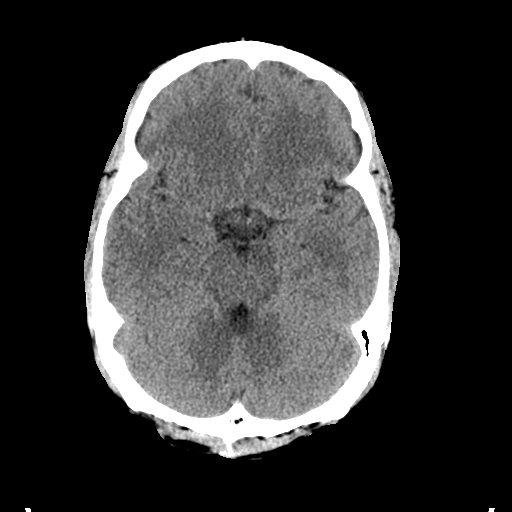
[im 16/35  brain]
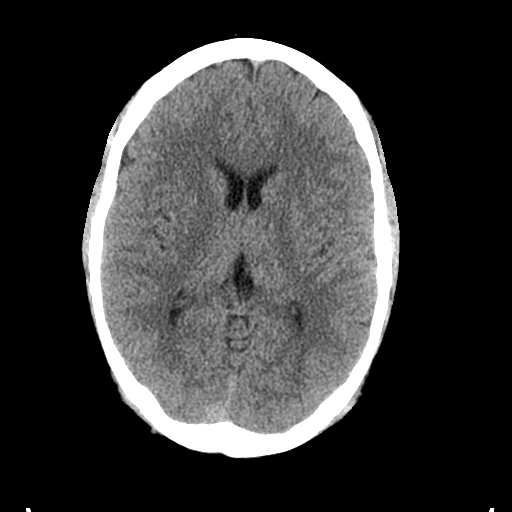
[im 19/35  brain]
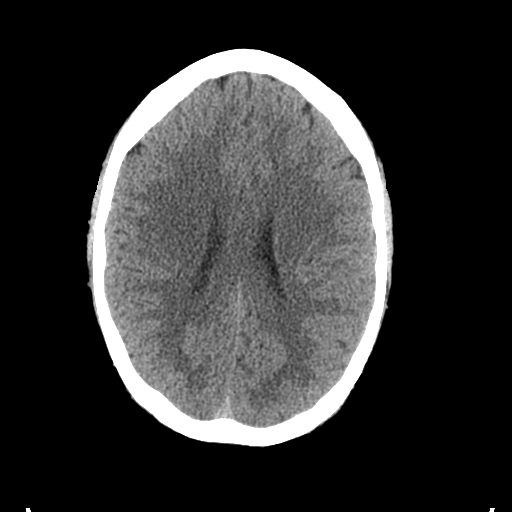
[im 19/35  bone]
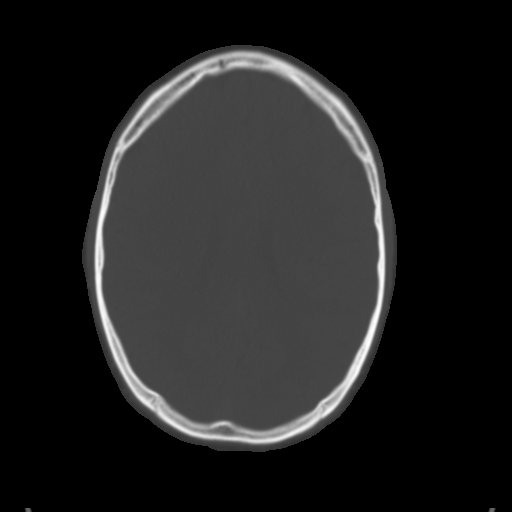
[im 24/35  brain]
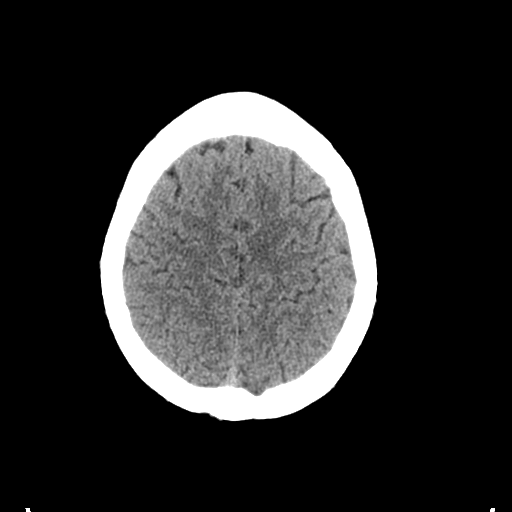
[im 27/35  brain]
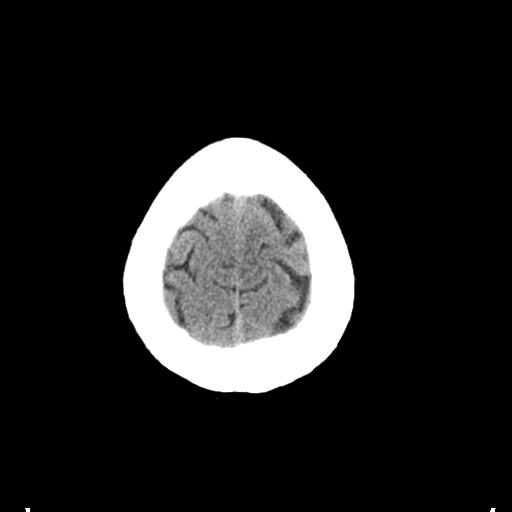
[im 32/35  brain]
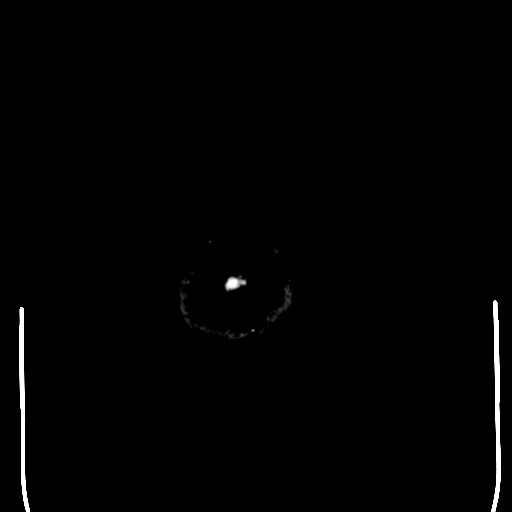

[Series 6: coronal soft tissue · coronal · 0.34mm/px · 3 of 84 slices shown]
[im 28/84  brain]
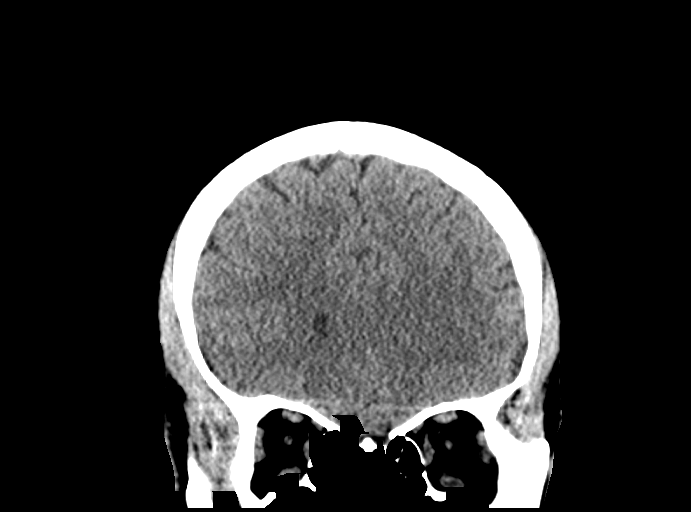
[im 37/84  brain]
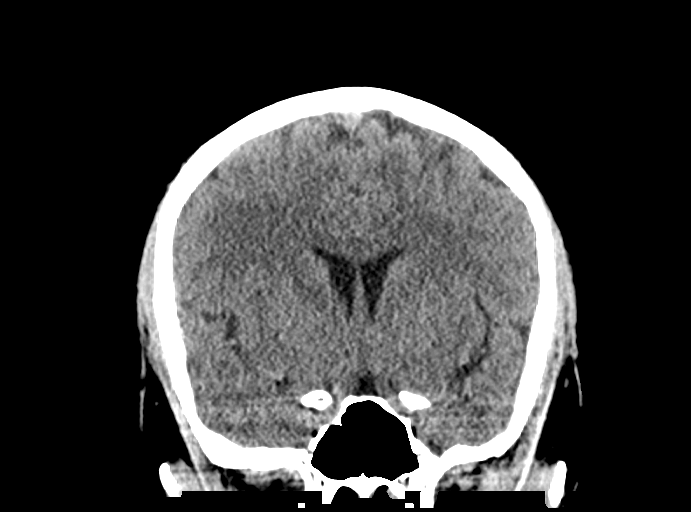
[im 47/84  brain]
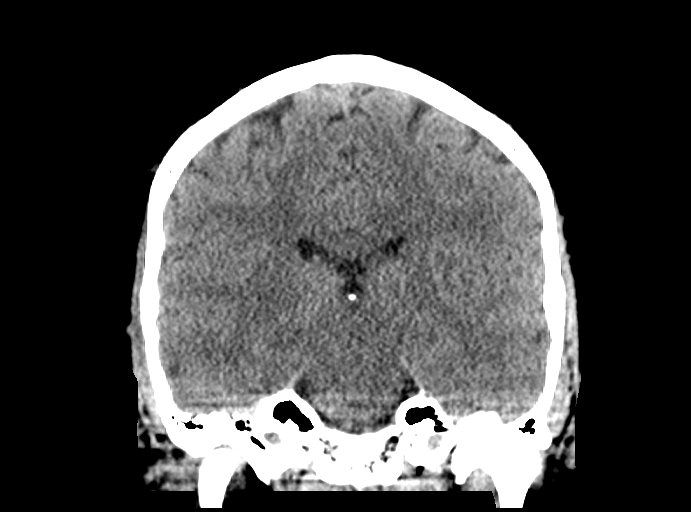

[Series 7: sagittal soft tissue · sagittal · 0.34mm/px · 3 of 78 slices shown]
[im 26/78  brain]
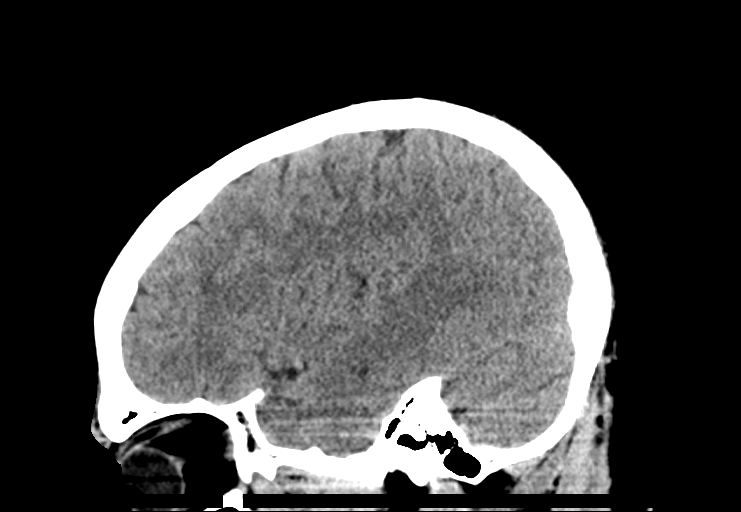
[im 39/78  brain]
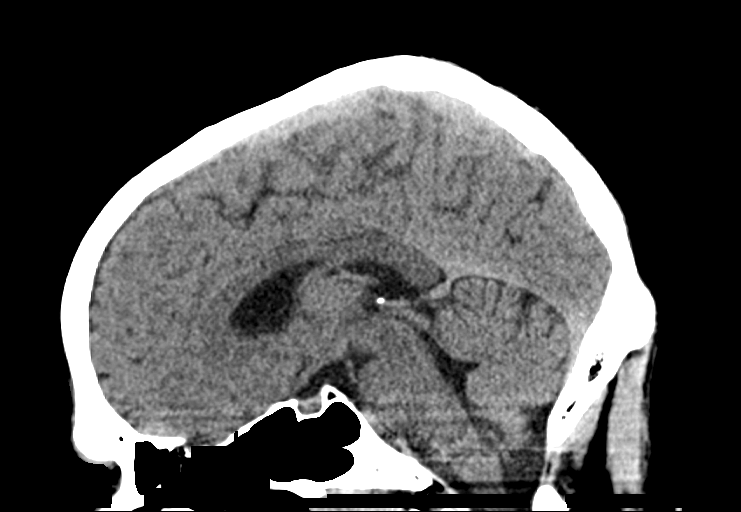
[im 52/78  brain]
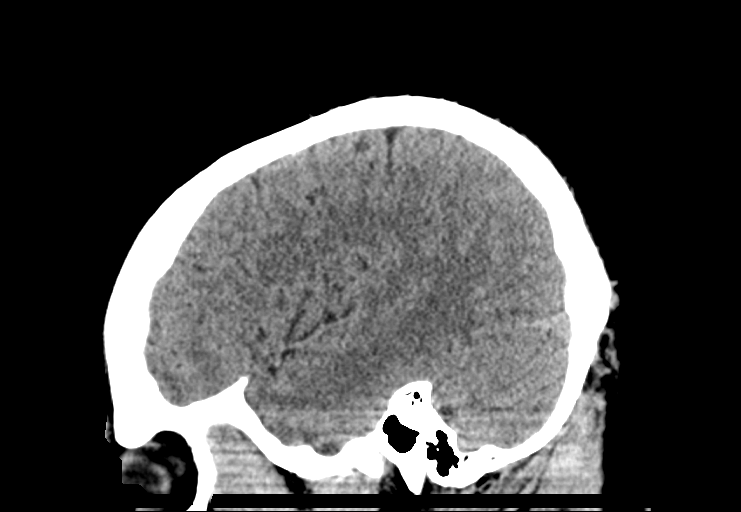

[14 of 47 positions shown; findings below may reference images not displayed]

FINDINGS: Brain: No acute intracranial findings are seen. Ventricles are not
dilated. There is no shift of midline structures. There are no
epidural or subdural fluid collections.

Vascular: Unremarkable.

Skull: Unremarkable.

Sinuses/Orbits: There is mild mucosal thickening in the ethmoid
sinus.

Other: None
IMPRESSION: No acute intracranial findings are seen in the noncontrast CT brain.
No significant interval changes are noted since 09/12/2021.

## 2022-05-04 ENCOUNTER — Emergency Department (HOSPITAL_COMMUNITY): Payer: BC Managed Care – PPO

## 2022-05-04 ENCOUNTER — Other Ambulatory Visit: Payer: Self-pay

## 2022-05-04 ENCOUNTER — Encounter (HOSPITAL_COMMUNITY): Payer: Self-pay

## 2022-05-04 ENCOUNTER — Emergency Department (HOSPITAL_COMMUNITY)
Admission: EM | Admit: 2022-05-04 | Discharge: 2022-05-05 | Disposition: A | Discharge status: Home / Self Care | Payer: BC Managed Care – PPO | Attending: Student | Admitting: Student

## 2022-05-04 DIAGNOSIS — Z20822 Contact with and (suspected) exposure to covid-19: Secondary | ICD-10-CM | POA: Insufficient documentation

## 2022-05-04 DIAGNOSIS — Z7951 Long term (current) use of inhaled steroids: Secondary | ICD-10-CM | POA: Insufficient documentation

## 2022-05-04 DIAGNOSIS — I10 Essential (primary) hypertension: Secondary | ICD-10-CM | POA: Insufficient documentation

## 2022-05-04 DIAGNOSIS — R42 Dizziness and giddiness: Secondary | ICD-10-CM | POA: Diagnosis not present

## 2022-05-04 DIAGNOSIS — R7401 Elevation of levels of liver transaminase levels: Secondary | ICD-10-CM | POA: Diagnosis not present

## 2022-05-04 DIAGNOSIS — J45909 Unspecified asthma, uncomplicated: Secondary | ICD-10-CM | POA: Insufficient documentation

## 2022-05-04 DIAGNOSIS — Y900 Blood alcohol level of less than 20 mg/100 ml: Secondary | ICD-10-CM | POA: Diagnosis not present

## 2022-05-04 DIAGNOSIS — R519 Headache, unspecified: Secondary | ICD-10-CM | POA: Insufficient documentation

## 2022-05-04 LAB — CBC WITH DIFFERENTIAL/PLATELET
Abs Immature Granulocytes: 0.01 10*3/uL (ref 0.00–0.07)
Basophils Absolute: 0 10*3/uL (ref 0.0–0.1)
Basophils Relative: 1 %
Eosinophils Absolute: 0.1 10*3/uL (ref 0.0–0.5)
Eosinophils Relative: 1 %
HCT: 39.5 % (ref 39.0–52.0)
Hemoglobin: 13.3 g/dL (ref 13.0–17.0)
Immature Granulocytes: 0 %
Lymphocytes Relative: 49 %
Lymphs Abs: 2.1 10*3/uL (ref 0.7–4.0)
MCH: 30 pg (ref 26.0–34.0)
MCHC: 33.7 g/dL (ref 30.0–36.0)
MCV: 89 fL (ref 80.0–100.0)
Monocytes Absolute: 0.3 10*3/uL (ref 0.1–1.0)
Monocytes Relative: 6 %
Neutro Abs: 1.9 10*3/uL (ref 1.7–7.7)
Neutrophils Relative %: 43 %
Platelets: 290 10*3/uL (ref 150–400)
RBC: 4.44 MIL/uL (ref 4.22–5.81)
RDW: 12.6 % (ref 11.5–15.5)
WBC: 4.4 10*3/uL (ref 4.0–10.5)
nRBC: 0 % (ref 0.0–0.2)

## 2022-05-04 LAB — COMPREHENSIVE METABOLIC PANEL
ALT: 49 U/L — ABNORMAL HIGH (ref 0–44)
AST: 46 U/L — ABNORMAL HIGH (ref 15–41)
Albumin: 4.3 g/dL (ref 3.5–5.0)
Alkaline Phosphatase: 66 U/L (ref 38–126)
Anion gap: 8 (ref 5–15)
BUN: 18 mg/dL (ref 6–20)
CO2: 25 mmol/L (ref 22–32)
Calcium: 9.1 mg/dL (ref 8.9–10.3)
Chloride: 106 mmol/L (ref 98–111)
Creatinine, Ser: 1.13 mg/dL (ref 0.61–1.24)
GFR, Estimated: >60 mL/min (ref >60)
Glucose, Bld: 130 mg/dL — ABNORMAL HIGH (ref 70–99)
Potassium: 3.7 mmol/L (ref 3.5–5.1)
Sodium: 139 mmol/L (ref 135–145)
Total Bilirubin: 0.7 mg/dL (ref 0.3–1.2)
Total Protein: 8 g/dL (ref 6.5–8.1)

## 2022-05-04 LAB — SALICYLATE LEVEL: Salicylate Lvl: <7 mg/dL — ABNORMAL LOW (ref 7.0–30.0)

## 2022-05-04 LAB — ETHANOL: Alcohol, Ethyl (B): <10 mg/dL (ref <10)

## 2022-05-04 LAB — ACETAMINOPHEN LEVEL: Acetaminophen (Tylenol), Serum: <10 ug/mL — ABNORMAL LOW (ref 10–30)

## 2022-05-04 LAB — TROPONIN I (HIGH SENSITIVITY): Troponin I (High Sensitivity): 4 ng/L (ref <18)

## 2022-05-04 MED ORDER — LACTATED RINGERS IV BOLUS
1000.0000 mL | Freq: Once | INTRAVENOUS | Status: AC
  Administered 2022-05-04: 1000 mL via INTRAVENOUS

## 2022-05-04 MED ORDER — METOCLOPRAMIDE HCL 5 MG/ML IJ SOLN
10.0000 mg | Freq: Once | INTRAMUSCULAR | Status: AC
  Administered 2022-05-04: 10 mg via INTRAVENOUS
  Filled 2022-05-04: qty 2

## 2022-05-04 MED ORDER — DIPHENHYDRAMINE HCL 50 MG/ML IJ SOLN
12.5000 mg | Freq: Once | INTRAMUSCULAR | Status: AC
  Administered 2022-05-04: 12.5 mg via INTRAVENOUS
  Filled 2022-05-04: qty 1

## 2022-05-04 MED ORDER — KETOROLAC TROMETHAMINE 15 MG/ML IJ SOLN
15.0000 mg | Freq: Once | INTRAMUSCULAR | Status: AC
  Administered 2022-05-04: 15 mg via INTRAVENOUS
  Filled 2022-05-04: qty 1

## 2022-05-04 NOTE — ED Provider Notes (Signed)
[facility]-EMERGENCY DEPT Provider Note   CSN: [id number] Arrival date & time: 05/04/22  2236     History {Add pertinent medical, surgical, social history, OB history to HPI:1} Chief Complaint  Patient presents with   Migraine    Victor Burns is a 31 y.o. male with history of polysubstance use who presents with concern for persistent headache since Sunday.  Endorses that headache started after he used cocaine.  Also states that Saturday night/Sunday morning he was binging alcohol simultaneously.  Also used marijuana that day.  Since that time has had persistent headache for 3 to hydration and Tylenol over-the-counter.  States headache resolved around 4 PM today but suddenly recurred with severe pain over the left frontal area around 730 today with onset of nausea and lightheadedness at the same time.  Pain is progressively improving since then but he continues to have intermittent severe pain and nausea.  No blurry or double vision, no numbness, tingling, or weakness.  Noticed changes in his speech.  I have personally reviewed this patient's medical records.  Has history of asthma on as needed albuterol.  HPI     Home Medications Prior to Admission medications   Medication Sig Start Date End Date Taking? Authorizing Provider  acetaminophen (TYLENOL) 500 MG tablet Take 500 mg by mouth every 6 (six) hours as needed.    [provider]  albuterol (VENTOLIN HFA) 108 (90 Base) MCG/ACT inhaler Inhale 1-2 puffs into the lungs every 6 (six) hours as needed for wheezing or shortness of breath. 10/18/21   Rushie Chestnut, PA-C  budesonide-formoterol (SYMBICORT) 160-4.5 MCG/ACT inhaler Inhale 2 puffs into the lungs in the morning and at bedtime. 12/18/21   Mannam, Colbert Coyer, MD  ibuprofen (ADVIL) 600 MG tablet Take 600 mg by mouth every 6 (six) hours as needed.    [provider]  omeprazole (PRILOSEC) 20 MG capsule Take 1 capsule (20 mg total) by mouth  daily. Patient not taking: Reported on 12/18/2021 11/24/21 03/24/22  Rema Fendt, NP      Allergies    Bee venom, Shellfish allergy, Compazine [prochlorperazine], and Other    Review of Systems   Review of Systems  Constitutional: Negative.   HENT: Negative.    Eyes:  Positive for visual disturbance.  Cardiovascular: Negative.   Gastrointestinal:  Positive for nausea. Negative for vomiting.  Genitourinary: Negative.   Musculoskeletal: Negative.   Skin: Negative.   Neurological:  Positive for dizziness, light-headedness and headaches.   Physical Exam Updated Vital Signs BP (!) 150/97 (BP Location: Right Arm)   Pulse 79   Temp 98.5 F (36.9 C) (Oral)   Resp 16   Ht 5\' 11"  (1.803 m)   Wt 111.1 kg   SpO2 100%   BMI 34.17 kg/m  Physical Exam Vitals and nursing note reviewed.  Constitutional:      Appearance: He is not ill-appearing or toxic-appearing.  HENT:     Head: Normocephalic and atraumatic.     Nose: Nose normal.     Mouth/Throat:     Mouth: Mucous membranes are moist.     Pharynx: No oropharyngeal exudate or posterior oropharyngeal erythema.  Eyes:     General:        Right eye: No discharge.        Left eye: No discharge.     Extraocular Movements: Extraocular movements intact.     Conjunctiva/sclera: Conjunctivae normal.     Pupils: Pupils are equal, round, and reactive to light.  Neck:     Trachea: Trachea and phonation normal.     Meningeal: Brudzinski's sign and Kernig's sign absent.  Cardiovascular:     Rate and Rhythm: Normal rate and regular rhythm.     Pulses: Normal pulses.     Heart sounds: Normal heart sounds. No murmur heard. Pulmonary:     Effort: Pulmonary effort is normal. No respiratory distress.     Breath sounds: Normal breath sounds. No wheezing or rales.  Abdominal:     General: Bowel sounds are normal. There is no distension.     Palpations: Abdomen is soft.     Tenderness: There is no abdominal tenderness. There is no right CVA  tenderness, left CVA tenderness, guarding or rebound.  Musculoskeletal:        General: No deformity.     Cervical back: Neck supple. No tenderness.     Right lower leg: No edema.  Lymphadenopathy:     Cervical: No cervical adenopathy.  Skin:    General: Skin is warm and dry.     Capillary Refill: Capillary refill takes less than 2 seconds.  Neurological:     General: No focal deficit present.     Mental Status: He is alert and oriented to person, place, and time. Mental status is at baseline.     GCS: GCS eye subscore is 4. GCS verbal subscore is 5. GCS motor subscore is 6.     Cranial Nerves: Cranial nerves 2-12 are intact.     Sensory: Sensation is intact.     Motor: Motor function is intact.     Gait: Gait is intact.  Psychiatric:        Mood and Affect: Mood normal.    ED Results / Procedures / Treatments   Labs (all labs ordered are listed, but only abnormal results are displayed) Labs Reviewed - No data to display  EKG None  Radiology No results found.  Procedures Procedures  {Document cardiac monitor, telemetry assessment procedure when appropriate:1}  Medications Ordered in ED Medications - No data to display  ED Course/ Medical Decision Making/ A&P                           Medical Decision Making 31 year old male who presents with concern for sudden onset severe headache tonight around 8pm.   Emergent considerations for headache include subarachnoid hemorrhage, meningitis, temporal arteritis, glaucoma, cerebral ischemia, carotid/vertebral dissection, intracranial tumor, Venous sinus thrombosis, carbon monoxide poisoning, acute or chronic subdural hemorrhage.  Other considerations include: Migraine, Cluster headache, Tension headache, Hypertension, Caffeine / alcohol / drug withdrawal, Pseudotumor cerebri, Arteriovenous malformation, Head injury, Neurocysticercosis, Post-lumbar puncture, Preeclampsia, Cervical arthritis, Refractive error causing strain, Dental  abscess, Sinusitis, Otitis media, Temporomandibular joint syndrome, Depression, Somatoform disorder (eg, somatization) Trigeminal neuralgia, Glossopharyngeal neuralgia.   HTN on intake, VS otherwise normal. Cardiopulmonary and abdominal exams are benign. Neuro exam without focal deficit, though patient is clearly photophobic. Well-appearing.   Amount and/or Complexity of Data Reviewed Labs: ordered. Radiology: ordered.   ***  {Document critical care time when appropriate:1} {Document review of labs and clinical decision tools ie heart score, Chads2Vasc2 etc:1}  {Document your independent review of radiology images, and any outside records:1} {Document your discussion with family members, caretakers, and with consultants:1} {Document social determinants of health affecting pt's care:1} {Document your decision making why or why not admission, treatments were needed:1} Final Clinical Impression(s) / ED Diagnoses Final diagnoses:  None  Rx / DC Orders ED Discharge Orders     None

## 2022-05-04 NOTE — ED Triage Notes (Signed)
Pt states that he has had a migraine for 3 days and today he is having dizziness and nausea.

## 2022-05-05 LAB — RAPID URINE DRUG SCREEN, HOSP PERFORMED
Amphetamines: NOT DETECTED
Barbiturates: NOT DETECTED
Benzodiazepines: NOT DETECTED
Cocaine: NOT DETECTED
Opiates: NOT DETECTED
Tetrahydrocannabinol: NOT DETECTED

## 2022-05-05 LAB — TROPONIN I (HIGH SENSITIVITY): Troponin I (High Sensitivity): 4 ng/L (ref <18)

## 2022-05-05 LAB — SARS CORONAVIRUS 2 BY RT PCR: SARS Coronavirus 2 by RT PCR: NEGATIVE

## 2022-05-05 MED ORDER — ACETAMINOPHEN 500 MG PO TABS
1000.0000 mg | ORAL_TABLET | Freq: Once | ORAL | Status: AC
  Administered 2022-05-05: 1000 mg via ORAL
  Filled 2022-05-05: qty 2

## 2022-05-05 NOTE — Discharge Instructions (Addendum)
You were seen in the ER today for your headache. Your workup was reassuring. There were no emergent problems identified on your workup today.  You improved with medications in the ER. You may followup with your PCP and return to the ER with any new severe symptoms.

## 2022-05-05 NOTE — ED Notes (Signed)
I provided reinforced discharge education based off of discharge instructions. Pt acknowledged and understood my education. Pt had no further questions/concerns for provider/myself. During my care of this pt, I had RN oversight. I informed RN of treatments and updates to pt condition to RN. I had RN assist when required with treatment, and provide appropriate initial/necessary assessments.  

## 2022-05-06 NOTE — Progress Notes (Unsigned)
Patient ID: Victor Burns, male    DOB: 1991-11-14  MRN: [id number]  CC: Emergency Department Follow-Up  Subjective: Victor Burns is a 31 y.o. male who presents for emergency department follow-up.   His concerns today include:  Emergency department follow-up: 05/04/2022 - 05/05/2022 [facility] per MD note: 31 year old male who presents with concern for sudden onset severe headache tonight around 8pm.    Emergent considerations for headache include subarachnoid hemorrhage, meningitis, temporal arteritis, glaucoma, cerebral ischemia, carotid/vertebral dissection, intracranial tumor, Venous sinus thrombosis, carbon monoxide poisoning, acute or chronic subdural hemorrhage.  Other considerations include: Migraine, Cluster headache, Tension headache, Hypertension, Caffeine / alcohol / drug withdrawal, Pseudotumor cerebri, Arteriovenous malformation, Head injury, Neurocysticercosis, Post-lumbar puncture, Preeclampsia, Cervical arthritis, Refractive error causing strain, Dental abscess, Sinusitis, Otitis media, Temporomandibular joint syndrome, Depression, Somatoform disorder (eg, somatization) Trigeminal neuralgia, Glossopharyngeal neuralgia.     HTN on intake, VS otherwise normal. Cardiopulmonary and abdominal exams are benign. Neuro exam without focal deficit, though patient is clearly photophobic. Well-appearing.    Amount and/or Complexity of Data Reviewed Labs: ordered.    Details: CBC unremarkable, CMP with transaminitis with AST/ALT 46/49.  Acetaminophen and salicylate, and ethanol levels are normal.  UDS negative.  Troponin negative, COVID-19 is negative. Radiology: ordered.    Details: CT head negative for acute intracranial abnormality.  Images visualized this provider. ECG/medicine tests: independent interpretation performed.    Details: NSR   Risk OTC drugs. Prescription drug management.     Patient reevaluated after migraine cocktailSignificant improvement in his  symptoms.  Total resolution of headache at this time.  He is feeling well and has tolerated p.o. in the emergency department.  Remains neurologically intact on exam.  No further work-up warranted near this time.   Suspect migraine headache versus tension type headache versus headache associated with viral illness.  Clinical concern for more emergent underlying etiology that would warrant further ED work-up or inpatient management exceedingly low.   Victor Burns voiced understanding of his medical evaluation and treatment plan. Each of his questions answered to his expressed satisfaction.  Return precautions were given.  Patient is well-appearing, stable, and was discharged in good condition.  05/07/2022:   Patient Active Problem List   Diagnosis Date Noted   GERD (gastroesophageal reflux disease) 11/24/2021   Perirectal abscess s/p I&D 06/16/2018 06/16/2018   Alcohol abuse 06/16/2018   Mild persistent asthma 10/07/2017     Current Outpatient Medications on File Prior to Visit  Medication Sig Dispense Refill   acetaminophen (TYLENOL) 500 MG tablet Take 500 mg by mouth every 6 (six) hours as needed.     albuterol (VENTOLIN HFA) 108 (90 Base) MCG/ACT inhaler Inhale 1-2 puffs into the lungs every 6 (six) hours as needed for wheezing or shortness of breath. 18 g 0   budesonide-formoterol (SYMBICORT) 160-4.5 MCG/ACT inhaler Inhale 2 puffs into the lungs in the morning and at bedtime. 10.2 g 5   ibuprofen (ADVIL) 600 MG tablet Take 600 mg by mouth every 6 (six) hours as needed.     omeprazole (PRILOSEC) 20 MG capsule Take 1 capsule (20 mg total) by mouth daily. (Patient not taking: Reported on 12/18/2021) 120 capsule 0   No current facility-administered medications on file prior to visit.    Allergies  Allergen Reactions   Bee Venom Anaphylaxis   Shellfish Allergy Anaphylaxis   Compazine [Prochlorperazine] Other (See Comments)    Trembling, feeling hot.    Other Itching    CHG wipes  Social  History   Socioeconomic History   Marital status: Single    Spouse name: Not on file   Number of children: Not on file   Years of education: Not on file   Highest education level: Not on file  Occupational History   Not on file  Tobacco Use   Smoking status: Some Days    Packs/day: 0.25    Years: 10.00    Pack years: 2.50    Types: Cigarettes   Smokeless tobacco: Never   Tobacco comments:    Smokes on weekends   Vaping Use   Vaping Use: Never used  Substance and Sexual Activity   Alcohol use: Yes    Alcohol/week: 10.0 standard drinks    Types: 10 Cans of beer per week    Comment: 2 bottles liquor/ per wk   Drug use: Yes    Types: Marijuana, Cocaine    Comment: Pt reports smoking every other day (2g)   Sexual activity: Not on file  Other Topics Concern   Not on file  Social History Narrative   ** Merged History Encounter **       Social Determinants of Health   Financial Resource Strain: Not on file  Food Insecurity: Not on file  Transportation Needs: Not on file  Physical Activity: Not on file  Stress: Not on file  Social Connections: Not on file  Intimate Partner Violence: Not on file    Family History  Problem Relation Age of Onset   Hypertension Mother    Hypertension Father     Past Surgical History:  Procedure Laterality Date   EVALUATION UNDER ANESTHESIA WITH HEMORRHOIDECTOMY N/A 08/08/2019   Procedure: EXAM UNDER ANESTHESIA WITH IRRIGATION AND DRAINAGE OF PERIRECTAL ABSCESS;  Surgeon: Andria Meuse, MD;  Location: [facility];  Service: General;  Laterality: N/A;   FISTULOTOMY N/A 02/20/2020   Procedure: PARTIAL  FISTULOTOMY WITH PLACEMENT OF CUTTING TETON;  Surgeon: Andria Meuse, MD;  Location: [facility];  Service: General;  Laterality: N/A;   INCISION AND DRAINAGE PERIRECTAL ABSCESS N/A 06/16/2018   Procedure: Francia Greaves UNDER ANESTHESIA IRRIGATION AND DEBRIDEMENT PERIRECTAL ABSCESS;  Surgeon: Emelia Loron, MD;  Location: [facility];  Service:  General;  Laterality: N/A;   KNEE ARTHROSCOPY     PLACEMENT OF SETON  08/08/2019   Procedure: PLACEMENT OF DRAINING SETON;  Surgeon: Andria Meuse, MD;  Location: [facility];  Service: General;;   RECTAL EXAM UNDER ANESTHESIA N/A 02/20/2020   Procedure: ANORECTAL EXAM UNDER ANESTHESIA;  Surgeon: Andria Meuse, MD;  Location: [facility];  Service: General;  Laterality: N/A;    ROS: Review of Systems Negative except as stated above  PHYSICAL EXAM: There were no vitals taken for this visit.  Physical Exam  {male adult master:310786} {male adult master:310785}     Latest Ref Rng & Units 05/04/2022   11:01 PM 02/22/2022    9:57 AM 09/12/2021    5:33 PM  CMP  Glucose 70 - 99 mg/dL 638   95   95    BUN 6 - 20 mg/dL 18   17   13     Creatinine 0.61 - 1.24 mg/dL   4.66   5.99    Sodium 135 - 145 mmol/L 139   136   138    Potassium 3.5 - 5.1 mmol/L 3.7   4.2   4.1    Chloride 98 - 111 mmol/L 106   103   105    CO2  22 - 32 mmol/L 25   26   25     Calcium 8.9 - 10.3 mg/dL 9.1   9.3   9.5    Total Protein 6.5 - 8.1 g/dL 8.0      Total Bilirubin 0.3 - 1.2 mg/dL 0.7      Alkaline Phos 38 - 126 U/L 66      AST 15 - 41 U/L 46      ALT 0 - 44 U/L 49       Lipid Panel  No results found for: CHOL, TRIG, HDL, CHOLHDL, VLDL, LDLCALC, LDLDIRECT  CBC    Component Value Date/Time   WBC 4.4 05/04/2022 2301   RBC 4.44 05/04/2022 2301   HGB 13.3 05/04/2022 2301   HCT 39.5 05/04/2022 2301   PLT 290 05/04/2022 2301   MCV 89.0 05/04/2022 2301   MCV 89.9 05/16/2018 1007   MCH 30.0 05/04/2022 2301   MCHC 33.7 05/04/2022 2301   RDW 12.6 05/04/2022 2301   LYMPHSABS 2.1 05/04/2022 2301   MONOABS 0.3 05/04/2022 2301   EOSABS 0.1 05/04/2022 2301   BASOSABS 0.0 05/04/2022 2301    ASSESSMENT AND PLAN:  There are no diagnoses linked to this encounter.   Patient was given the opportunity to ask questions.  Patient verbalized understanding of the plan and was able to repeat key elements  of the plan. Patient was given clear instructions to go to Emergency Department or return to medical center if symptoms don't improve, worsen, or new problems develop.The patient verbalized understanding.   No orders of the defined types were placed in this encounter.    Requested Prescriptions    No prescriptions requested or ordered in this encounter    No follow-ups on file.  Rema FendtAmy J Saprina Chuong, NP

## 2022-05-07 ENCOUNTER — Encounter: Payer: Self-pay | Admitting: Family

## 2022-05-07 DIAGNOSIS — R519 Headache, unspecified: Secondary | ICD-10-CM

## 2022-05-14 ENCOUNTER — Telehealth: Payer: Self-pay | Admitting: Family

## 2022-05-14 NOTE — Telephone Encounter (Signed)
Lvm to call back to resched NO showed appt missed for Hospital Follow up with Victor Burns.

## 2022-05-23 ENCOUNTER — Other Ambulatory Visit: Payer: Self-pay

## 2022-05-23 ENCOUNTER — Encounter (HOSPITAL_BASED_OUTPATIENT_CLINIC_OR_DEPARTMENT_OTHER): Payer: Self-pay

## 2022-05-23 ENCOUNTER — Emergency Department (HOSPITAL_BASED_OUTPATIENT_CLINIC_OR_DEPARTMENT_OTHER): Payer: BC Managed Care – PPO | Admitting: Radiology

## 2022-05-23 ENCOUNTER — Emergency Department (HOSPITAL_BASED_OUTPATIENT_CLINIC_OR_DEPARTMENT_OTHER)
Admission: EM | Admit: 2022-05-23 | Discharge: 2022-05-23 | Disposition: A | Discharge status: Home / Self Care | Payer: BC Managed Care – PPO | Attending: Emergency Medicine | Admitting: Emergency Medicine

## 2022-05-23 DIAGNOSIS — R079 Chest pain, unspecified: Secondary | ICD-10-CM | POA: Insufficient documentation

## 2022-05-23 DIAGNOSIS — R0789 Other chest pain: Secondary | ICD-10-CM | POA: Diagnosis not present

## 2022-05-23 LAB — CBC
HCT: 39.5 % (ref 39.0–52.0)
Hemoglobin: 12.9 g/dL — ABNORMAL LOW (ref 13.0–17.0)
MCH: 28.9 pg (ref 26.0–34.0)
MCHC: 32.7 g/dL (ref 30.0–36.0)
MCV: 88.6 fL (ref 80.0–100.0)
Platelets: 315 10*3/uL (ref 150–400)
RBC: 4.46 MIL/uL (ref 4.22–5.81)
RDW: 13 % (ref 11.5–15.5)
WBC: 3.8 10*3/uL — ABNORMAL LOW (ref 4.0–10.5)
nRBC: 0 % (ref 0.0–0.2)

## 2022-05-23 LAB — BASIC METABOLIC PANEL
Anion gap: 12 (ref 5–15)
BUN: 17 mg/dL (ref 6–20)
CO2: 22 mmol/L (ref 22–32)
Calcium: 9.7 mg/dL (ref 8.9–10.3)
Chloride: 103 mmol/L (ref 98–111)
Creatinine, Ser: 1 mg/dL (ref 0.61–1.24)
GFR, Estimated: >60 mL/min (ref >60)
Glucose, Bld: 103 mg/dL — ABNORMAL HIGH (ref 70–99)
Potassium: 4 mmol/L (ref 3.5–5.1)
Sodium: 137 mmol/L (ref 135–145)

## 2022-05-23 LAB — TROPONIN I (HIGH SENSITIVITY): Troponin I (High Sensitivity): 4 ng/L (ref <18)

## 2022-05-23 MED ORDER — ACETAMINOPHEN 325 MG PO TABS
650.0000 mg | ORAL_TABLET | Freq: Once | ORAL | Status: AC
  Administered 2022-05-23: 650 mg via ORAL
  Filled 2022-05-23: qty 2

## 2022-05-23 MED ORDER — IBUPROFEN 400 MG PO TABS
600.0000 mg | ORAL_TABLET | Freq: Once | ORAL | Status: AC
  Administered 2022-05-23: 600 mg via ORAL
  Filled 2022-05-23: qty 1

## 2022-05-23 NOTE — ED Triage Notes (Signed)
Patient here POV from Home with CP.  Endorses having CP that began at 1900 yesterday PM and has remained constant since it began. Mainly oriented to Left Chest and radiates at times to Back.  Lightheaded and SOB late last PM and this AM. States he also began to have Left Wrist that began this AM as well.  NAD Noted during Triage. A&Ox4. GCS 15. Ambulatory.

## 2022-05-23 NOTE — ED Provider Notes (Signed)
[facility] EMERGENCY DEPT Provider Note   CSN: [id number] Arrival date & time: 05/23/22  1107     History  Chief Complaint  Patient presents with   Chest Pain    Victor Burns is a 31 y.o. male.  Patient is ER chief with chest pain.  Describes an aching sharp pain in the left chest started yesterday and been persistent.  He states he had similar pains multiple times in the past has been worked up by cardiology with a stress test that was unremarkable.  Also woke up in middle night with left wrist pain which is now gone.  No reports of fevers or cough or vomiting or diarrhea.       Home Medications Prior to Admission medications   Medication Sig Start Date End Date Taking? Authorizing Provider  acetaminophen (TYLENOL) 500 MG tablet Take 500 mg by mouth every 6 (six) hours as needed.    [provider]  albuterol (VENTOLIN HFA) 108 (90 Base) MCG/ACT inhaler Inhale 1-2 puffs into the lungs every 6 (six) hours as needed for wheezing or shortness of breath. 10/18/21   Rushie Chestnut, PA-C  budesonide-formoterol (SYMBICORT) 160-4.5 MCG/ACT inhaler Inhale 2 puffs into the lungs in the morning and at bedtime. 12/18/21   Mannam, Colbert Coyer, MD  ibuprofen (ADVIL) 600 MG tablet Take 600 mg by mouth every 6 (six) hours as needed.    [provider]  omeprazole (PRILOSEC) 20 MG capsule Take 1 capsule (20 mg total) by mouth daily. Patient not taking: Reported on 12/18/2021 11/24/21 03/24/22  Rema Fendt, NP      Allergies    Bee venom, Shellfish allergy, Compazine [prochlorperazine], and Other    Review of Systems   Review of Systems  Constitutional:  Negative for fever.  HENT:  Negative for ear pain and sore throat.   Eyes:  Negative for pain.  Respiratory:  Negative for cough.   Cardiovascular:  Positive for chest pain.  Gastrointestinal:  Negative for abdominal pain.  Genitourinary:  Negative for flank pain.  Musculoskeletal:  Negative for back  pain.  Skin:  Negative for color change and rash.  Neurological:  Negative for syncope.  All other systems reviewed and are negative.   Physical Exam Updated Vital Signs BP 135/80   Pulse (!) 50   Temp 98.4 F (36.9 C)   Resp 15   Ht 5\' 11"  (1.803 m)   Wt 111.1 kg   SpO2 98%   BMI 34.16 kg/m  Physical Exam Constitutional:      Appearance: He is well-developed.  HENT:     Head: Normocephalic.     Nose: Nose normal.  Eyes:     Extraocular Movements: Extraocular movements intact.  Cardiovascular:     Rate and Rhythm: Normal rate.  Pulmonary:     Effort: Pulmonary effort is normal.  Musculoskeletal:     Comments: Moderate left chest wall tenderness reproducible on palpation.  Skin:    Coloration: Skin is not jaundiced.  Neurological:     General: No focal deficit present.     Mental Status: He is alert and oriented to person, place, and time. Mental status is at baseline.     Cranial Nerves: No cranial nerve deficit.     Motor: No weakness.     Gait: Gait normal.     ED Results / Procedures / Treatments   Labs (all labs ordered are listed, but only abnormal results are displayed) Labs Reviewed  BASIC METABOLIC PANEL -  Abnormal; Notable for the following components:      Result Value   Glucose, Bld 103 (*)    All other components within normal limits  CBC - Abnormal; Notable for the following components:   WBC 3.8 (*)    Hemoglobin 12.9 (*)    All other components within normal limits  TROPONIN I (HIGH SENSITIVITY)  TROPONIN I (HIGH SENSITIVITY)    EKG EKG Interpretation  Date/Time:  Sunday May 23 2022 11:14:34 EDT Ventricular Rate:  61 PR Interval:  170 QRS Duration: 86 QT Interval:  400 QTC Calculation: 402 R Axis:   47 Text Interpretation: Normal sinus rhythm ST elevation, consider early repolarization Borderline ECG When compared with ECG of 04-May-2022 22:54, PREVIOUS ECG IS PRESENT Confirmed by Norman Clay ([id number]) on 05/23/2022 11:41:31  AM  Radiology DG Chest 2 View  Result Date: 05/23/2022 CLINICAL DATA:  Chest pain 1 day. EXAM: CHEST - 2 VIEW COMPARISON:  02/22/2022 FINDINGS: Lungs are adequately inflated without focal airspace consolidation or effusion. Cardiomediastinal silhouette and remainder of the exam is unchanged. IMPRESSION: No active cardiopulmonary disease. Electronically Signed   By: Elberta Fortis M.D.   On: 05/23/2022 12:06    Procedures Procedures    Medications Ordered in ED Medications  acetaminophen (TYLENOL) tablet 650 mg (650 mg Oral Given 05/23/22 1217)  ibuprofen (ADVIL) tablet 600 mg (600 mg Oral Given 05/23/22 1217)    ED Course/ Medical Decision Making/ A&P                           Medical Decision Making Amount and/or Complexity of Data Reviewed Labs: ordered. Radiology: ordered.  Risk OTC drugs.   Review of records shows visit in May 04, 2022 for headache.  Cardiac monitoring showing sinus rhythm.   EKG has diffuse J-point elevation, however appears similar to prior EKGs in the past.  Labs otherwise unremarkable troponin is negative given the pain has been there for over 12 hours with negative troponin I doubt acute coronary syndrome.  Will advise outpatient follow-up with his cardiologist this week recommend avoidance of strenuous activity.  Recommend immediate return for worsening symptoms or any additional concerns.        Final Clinical Impression(s) / ED Diagnoses Final diagnoses:  Chest pain, unspecified type    Rx / DC Orders ED Discharge Orders     None         Cheryll Cockayne, MD 05/23/22 1435

## 2022-05-23 NOTE — Discharge Instructions (Signed)
Your tests were normal today.  Avoid strenuous activities, follow-up with cardiology this week, return back to the ER if your symptoms worsen if you have fevers difficulty breathing or any additional concerns.

## 2022-05-24 ENCOUNTER — Ambulatory Visit: Payer: Self-pay | Admitting: *Deleted

## 2022-05-24 NOTE — Telephone Encounter (Signed)
  Chief Complaint: Chest Pain Symptoms: Left sided chest pain "More in middle" Varies in intensity "Worse when lying on left side." States "Had this for 1 1/2 years, I think it's a chest hernia." States goes to ED when gets bad. "They never find anything wrong." In ED yesterday.SOB at times.  Frequency: 1 1/2 years . OCcurs daily Pertinent Negatives: Patient denies pain does not radiate, no nausea, vomiting. Disposition: [] ED /[] Urgent Care (no appt availability in office) / [x] Appointment(In office/virtual)/ []  [facility]/ [] Home Care/ [] Refused Recommended Disposition /[] [facility]/ []  Follow-up with PCP Additional Notes: Secured first available in July 6th and placed on wait list. Advised ED for worsening symptoms. Reason for Disposition  [1] Chest pain(s) lasting a few seconds AND [2] persists > 3 days  Answer Assessment - Initial Assessment Questions 1. LOCATION: "Where does it hurt?"       Left side, closer to middle 2. RADIATION: "Does the pain go anywhere else?" (e.g., into neck, jaw, arms, back)     Back sometimes 3. ONSET: "When did the chest pain begin?" (Minutes, hours or days)      1 1/2 years, real painful  go to ED. Never found anything 4. PATTERN "Does the pain come and go, or has it been constant since it started?"  "Does it get worse with exertion?"      Comes and goes, but varies in intensityWorse when lying down on left side 5. DURATION: "How long does it last" (e.g., seconds, minutes, hours)     Varies 6. SEVERITY: "How bad is the pain?"  (e.g., Scale 1-10; mild, moderate, or severe)    - MILD (1-3): doesn't interfere with normal activities     - MODERATE (4-7): interferes with normal activities or awakens from sleep    - SEVERE (8-10): excruciating pain, unable to do any normal activities       Dull, sometimes sharp 7. CARDIAC RISK FACTORS: "Do you have any history of heart problems or risk factors for heart disease?" (e.g., angina, prior heart  attack; diabetes, high blood pressure, high cholesterol, smoker, or strong family history of heart disease)      8. PULMONARY RISK FACTORS: "Do you have any history of lung disease?"  (e.g., blood clots in lung, asthma, emphysema, birth control pills)      9. CAUSE: "What do you think is causing the chest pain?"     "Hernia" 10. OTHER SYMPTOMS: "Do you have any other symptoms?" (e.g., dizziness, nausea, vomiting, sweating, fever, difficulty breathing, cough)       SOB at times  Protocols used: Chest Pain-A-AH

## 2022-05-25 ENCOUNTER — Ambulatory Visit (HOSPITAL_BASED_OUTPATIENT_CLINIC_OR_DEPARTMENT_OTHER): Payer: BC Managed Care – PPO | Admitting: Family

## 2022-06-01 NOTE — Progress Notes (Signed)
Patient ID: Victor Burns, male    DOB: May 17, 1991  MRN: [id number]  CC: Annual Physical Exam  Subjective: Victor Burns is a 31 y.o. male who presents for annual physical exam.   His concerns today include:  - Intermittent lower abdominal pain. Denies red flag symptoms such as but not limited to nausea, vomiting, blood in stools/darker than normal stools. Needs refills on acid reflux medication. Requesting referral to Gastroenterology.  - Chest pain continuing since emergency department discharge on 05/23/2022. Missed recent appointment with Cardiology 05/25/2022 but was able to reschedule for 06/15/2022. Reports he is smoking.   Patient Active Problem List   Diagnosis Date Noted   GERD (gastroesophageal reflux disease) 11/24/2021   Perirectal abscess s/p I&D 06/16/2018 06/16/2018   Alcohol abuse 06/16/2018   Mild persistent asthma 10/07/2017     Current Outpatient Medications on File Prior to Visit  Medication Sig Dispense Refill   acetaminophen (TYLENOL) 500 MG tablet Take 500 mg by mouth every 6 (six) hours as needed.     albuterol (VENTOLIN HFA) 108 (90 Base) MCG/ACT inhaler Inhale 1-2 puffs into the lungs every 6 (six) hours as needed for wheezing or shortness of breath. 18 g 0   budesonide-formoterol (SYMBICORT) 160-4.5 MCG/ACT inhaler Inhale 2 puffs into the lungs in the morning and at bedtime. 10.2 g 5   ibuprofen (ADVIL) 600 MG tablet Take 600 mg by mouth every 6 (six) hours as needed.     No current facility-administered medications on file prior to visit.    Allergies  Allergen Reactions   Bee Venom Anaphylaxis   Shellfish Allergy Anaphylaxis   Compazine [Prochlorperazine] Other (See Comments)    Trembling, feeling hot.    Other Itching    CHG wipes    Social History   Socioeconomic History   Marital status: Single    Spouse name: Not on file   Number of children: Not on file   Years of education: Not on file   Highest education level: Not on file   Occupational History   Not on file  Tobacco Use   Smoking status: Some Days    Packs/day: 0.25    Years: 10.00    Total pack years: 2.50    Types: Cigarettes    Passive exposure: Current   Smokeless tobacco: Never   Tobacco comments:    Smokes on weekends   Vaping Use   Vaping Use: Never used  Substance and Sexual Activity   Alcohol use: Yes    Alcohol/week: 10.0 standard drinks of alcohol    Types: 10 Cans of beer per week    Comment: 2 bottles liquor/ per wk   Drug use: Yes    Frequency: 3.0 times per week    Types: Marijuana    Comment: Pt reports smoking every other day (2g)   Sexual activity: Not on file  Other Topics Concern   Not on file  Social History Narrative   ** Merged History Encounter **       Social Determinants of Health   Financial Resource Strain: Not on file  Food Insecurity: Not on file  Transportation Needs: Not on file  Physical Activity: Not on file  Stress: Not on file  Social Connections: Not on file  Intimate Partner Violence: Not on file    Family History  Problem Relation Age of Onset   Hypertension Mother    Hypertension Father     Past Surgical History:  Procedure Laterality Date   EVALUATION  UNDER ANESTHESIA WITH HEMORRHOIDECTOMY N/A 08/08/2019   Procedure: EXAM UNDER ANESTHESIA WITH IRRIGATION AND DRAINAGE OF PERIRECTAL ABSCESS;  Surgeon: Andria Meuse, MD;  Location: [facility];  Service: General;  Laterality: N/A;   FISTULOTOMY N/A 02/20/2020   Procedure: PARTIAL  FISTULOTOMY WITH PLACEMENT OF CUTTING TETON;  Surgeon: Andria Meuse, MD;  Location: [facility];  Service: General;  Laterality: N/A;   INCISION AND DRAINAGE PERIRECTAL ABSCESS N/A 06/16/2018   Procedure: Francia Greaves UNDER ANESTHESIA IRRIGATION AND DEBRIDEMENT PERIRECTAL ABSCESS;  Surgeon: Emelia Loron, MD;  Location: [facility];  Service: General;  Laterality: N/A;   KNEE ARTHROSCOPY     PLACEMENT OF SETON  08/08/2019   Procedure: PLACEMENT OF DRAINING SETON;   Surgeon: Andria Meuse, MD;  Location: [facility];  Service: General;;   RECTAL EXAM UNDER ANESTHESIA N/A 02/20/2020   Procedure: ANORECTAL EXAM UNDER ANESTHESIA;  Surgeon: Andria Meuse, MD;  Location: [facility];  Service: General;  Laterality: N/A;    ROS: Review of Systems Negative except as stated above  PHYSICAL EXAM: BP 120/82 (BP Location: Left Arm, Patient Position: Sitting, Cuff Size: Large)   Pulse 65   Temp 98.3 F (36.8 C)   Resp 16   Ht 5' 10.98" (1.803 m)   Wt 250 lb (113.4 kg)   SpO2 95%   BMI 34.88 kg/m   Physical Exam HENT:     Head: Normocephalic and atraumatic.     Right Ear: Tympanic membrane, ear canal and external ear normal.     Left Ear: Tympanic membrane, ear canal and external ear normal.     Nose: Nose normal.     Mouth/Throat:     Mouth: Mucous membranes are moist.     Pharynx: Oropharynx is clear.  Eyes:     Extraocular Movements: Extraocular movements intact.     Conjunctiva/sclera: Conjunctivae normal.     Pupils: Pupils are equal, round, and reactive to light.  Cardiovascular:     Rate and Rhythm: Normal rate and regular rhythm.     Pulses: Normal pulses.     Heart sounds: Normal heart sounds.  Pulmonary:     Effort: Pulmonary effort is normal.     Breath sounds: Normal breath sounds.  Abdominal:     General: Bowel sounds are normal.     Palpations: Abdomen is soft.  Genitourinary:    Comments: Patient declined. Musculoskeletal:        General: Normal range of motion.     Right shoulder: Normal.     Left shoulder: Normal.     Right upper arm: Normal.     Left upper arm: Normal.     Right elbow: Normal.     Left elbow: Normal.     Right forearm: Normal.     Left forearm: Normal.     Right wrist: Normal.     Left wrist: Normal.     Right hand: Normal.     Left hand: Normal.     Cervical back: Normal, normal range of motion and neck supple.     Thoracic back: Normal.     Lumbar back: Normal.     Right hip: Normal.      Left hip: Normal.     Right upper leg: Normal.     Left upper leg: Normal.     Right knee: Normal.     Left knee: Normal.     Right lower leg: Normal.     Left lower leg: Normal.  Right ankle: Normal.     Left ankle: Normal.     Right foot: Normal.     Left foot: Normal.  Skin:    General: Skin is warm and dry.     Capillary Refill: Capillary refill takes less than 2 seconds.  Neurological:     General: No focal deficit present.     Mental Status: He is alert and oriented to person, place, and time.  Psychiatric:        Mood and Affect: Mood normal.        Behavior: Behavior normal.    ASSESSMENT AND PLAN: 1. Annual physical exam - Counseled on 150 minutes of exercise per week as tolerated, healthy eating (including decreased daily intake of saturated fats, cholesterol, added sugars, sodium), STI prevention, and routine healthcare maintenance.  2. Screening for metabolic disorder - Screening liver function.  - Hepatic Function Panel  3. Diabetes mellitus screening - Hemoglobin A1c to screen for pre-diabetes/diabetes. - Hemoglobin A1c  4. Screening cholesterol level - Lipid panel to screen for high cholesterol.  - Lipid panel  5. Routine screening for STI (sexually transmitted infection) - Routine STI screening.  - Urine cytology ancillary only - HIV antibody (with reflex) - RPR - HSV(herpes simplex vrs) 1+2 ab-IgG  6. Abdominal pain, unspecified abdominal location 7. Gastroesophageal reflux disease, unspecified whether esophagitis present - Omeprazole as prescribed.  - Referral to Gastroenterology for further evaluation and management. - Ambulatory referral to Gastroenterology - omeprazole (PRILOSEC) 20 MG capsule; Take 1 capsule (20 mg total) by mouth daily.  Dispense: 90 capsule; Refill: 1  8. Chest pain, unspecified type - Keep appointment scheduled 06/15/2022 with Jari Favre, PA at Cardiology.     Patient was given the opportunity to ask questions.   Patient verbalized understanding of the plan and was able to repeat key elements of the plan. Patient was given clear instructions to go to Emergency Department or return to medical center if symptoms don't improve, worsen, or new problems develop.The patient verbalized understanding.   Orders Placed This Encounter  Procedures   Hepatic Function Panel   Lipid panel   Hemoglobin A1c   HIV antibody (with reflex)   RPR   HSV(herpes simplex vrs) 1+2 ab-IgG   Ambulatory referral to Gastroenterology     Requested Prescriptions   Signed Prescriptions Disp Refills   omeprazole (PRILOSEC) 20 MG capsule 90 capsule 1    Sig: Take 1 capsule (20 mg total) by mouth daily.    Return in about 1 year (around 06/03/2023) for Physical per patient preference.  Rema Fendt, NP

## 2022-06-02 ENCOUNTER — Encounter: Payer: Self-pay | Admitting: Family

## 2022-06-02 ENCOUNTER — Other Ambulatory Visit (HOSPITAL_COMMUNITY)
Admission: RE | Admit: 2022-06-02 | Discharge: 2022-06-02 | Disposition: A | Discharge status: Home / Self Care | Payer: BC Managed Care – PPO | Source: Ambulatory Visit | Attending: Family Medicine | Admitting: Family Medicine

## 2022-06-02 ENCOUNTER — Ambulatory Visit (INDEPENDENT_AMBULATORY_CARE_PROVIDER_SITE_OTHER): Payer: BC Managed Care – PPO | Admitting: Family

## 2022-06-02 VITALS — BP 120/82 | HR 65 | Temp 98.3°F | Resp 16 | Ht 70.98 in | Wt 250.0 lb

## 2022-06-02 DIAGNOSIS — Z113 Encounter for screening for infections with a predominantly sexual mode of transmission: Secondary | ICD-10-CM | POA: Insufficient documentation

## 2022-06-02 DIAGNOSIS — R079 Chest pain, unspecified: Secondary | ICD-10-CM | POA: Diagnosis not present

## 2022-06-02 DIAGNOSIS — R109 Unspecified abdominal pain: Secondary | ICD-10-CM | POA: Diagnosis not present

## 2022-06-02 DIAGNOSIS — Z131 Encounter for screening for diabetes mellitus: Secondary | ICD-10-CM | POA: Diagnosis not present

## 2022-06-02 DIAGNOSIS — K219 Gastro-esophageal reflux disease without esophagitis: Secondary | ICD-10-CM | POA: Diagnosis not present

## 2022-06-02 DIAGNOSIS — Z0001 Encounter for general adult medical examination with abnormal findings: Secondary | ICD-10-CM | POA: Diagnosis not present

## 2022-06-02 DIAGNOSIS — Z13228 Encounter for screening for other metabolic disorders: Secondary | ICD-10-CM

## 2022-06-02 DIAGNOSIS — Z13220 Encounter for screening for lipoid disorders: Secondary | ICD-10-CM | POA: Diagnosis not present

## 2022-06-02 DIAGNOSIS — Z0000 Encounter for general adult medical examination without abnormal findings: Secondary | ICD-10-CM

## 2022-06-02 MED ORDER — OMEPRAZOLE 20 MG PO CPDR: 20.0000 mg | DELAYED_RELEASE_CAPSULE | Freq: Every day | ORAL | 1 refills | Status: DC

## 2022-06-02 NOTE — Patient Instructions (Signed)
Preventive Care 21-30 Years Old, Male Preventive care refers to lifestyle choices and visits with your health care provider that can promote health and wellness. Preventive care visits are also called wellness exams. What can I expect for my preventive care visit? Counseling During your preventive care visit, your health care provider may ask about your: Medical history, including: Past medical problems. Family medical history. Current health, including: Emotional well-being. Home life and relationship well-being. Sexual activity. Lifestyle, including: Alcohol, nicotine or tobacco, and drug use. Access to firearms. Diet, exercise, and sleep habits. Safety issues such as seatbelt and bike helmet use. Sunscreen use. Work and work environment. Physical exam Your health care provider may check your: Height and weight. These may be used to calculate your BMI (body mass index). BMI is a measurement that tells if you are at a healthy weight. Waist circumference. This measures the distance around your waistline. This measurement also tells if you are at a healthy weight and may help predict your risk of certain diseases, such as type 2 diabetes and high blood pressure. Heart rate and blood pressure. Body temperature.  Vaccines are usually given at various ages, according to a schedule. Your health care provider will recommend vaccines for you based on your age, medical history, and lifestyle or other factors, such as travel or where you work. What tests do I need? Screening Your health care provider may recommend screening tests for certain conditions. This may include: Lipid and cholesterol levels. Diabetes screening. This is done by checking your blood sugar (glucose) after you have not eaten for a while (fasting). Hepatitis B test. Hepatitis C test. HIV (human immunodeficiency virus) test. STI (sexually transmitted infection) testing, if you are at risk. Talk with your health care  provider about your test results, treatment options, and if necessary, the need for more tests. Follow these instructions at home: Eating and drinking  Eat a healthy diet that includes fresh fruits and vegetables, whole grains, lean protein, and low-fat dairy products. Drink enough fluid to keep your urine pale yellow. Take vitamin and mineral supplements as recommended by your health care provider. Do not drink alcohol if your health care provider tells you not to drink. If you drink alcohol: Limit how much you have to 0-2 drinks a day. Know how much alcohol is in your drink. In the [place] one drink equals one 12 oz bottle of beer (355 mL), one 5 oz glass of wine (148 mL), or one 1 oz glass of hard liquor (44 mL). Lifestyle Brush your teeth every morning and night with fluoride toothpaste. Floss one time each day. Exercise for at least 30 minutes 5 or more days each week. Do not use any products that contain nicotine or tobacco. These products include cigarettes, chewing tobacco, and vaping devices, such as e-cigarettes. If you need help quitting, ask your health care provider. Do not use drugs. If you are sexually active, practice safe sex. Use a condom or other form of protection to prevent STIs. Find healthy ways to manage stress, such as: Meditation, yoga, or listening to music. Journaling. Talking to a trusted person. Spending time with friends and family. Minimize exposure to UV radiation to reduce your risk of skin cancer. Safety Always wear your seat belt while driving or riding in a vehicle. Do not drive: If you have been drinking alcohol. Do not ride with someone who has been drinking. If you have been using any mind-altering substances or drugs. While texting. When you are tired or   distracted. Wear a helmet and other protective equipment during sports activities. If you have firearms in your house, make sure you follow all gun safety procedures. Seek help if you have been  physically or sexually abused. What's next? Go to your health care provider once a year for an annual wellness visit. Ask your health care provider how often you should have your eyes and teeth checked. Stay up to date on all vaccines. This information is not intended to replace advice given to you by your health care provider. Make sure you discuss any questions you have with your health care provider. Document Revised: 05/20/2021 Document Reviewed: 05/20/2021 Elsevier Patient Education  2023 Elsevier Inc.  

## 2022-06-02 NOTE — Progress Notes (Signed)
Pt presents for annual physical exam, has complaints of abdominal pain unsure of what's causing it request referral to GI

## 2022-06-03 ENCOUNTER — Other Ambulatory Visit: Payer: Self-pay | Admitting: Family

## 2022-06-03 DIAGNOSIS — E785 Hyperlipidemia, unspecified: Secondary | ICD-10-CM | POA: Insufficient documentation

## 2022-06-03 DIAGNOSIS — R7303 Prediabetes: Secondary | ICD-10-CM | POA: Insufficient documentation

## 2022-06-03 LAB — LIPID PANEL
Chol/HDL Ratio: 6.4 ratio — ABNORMAL HIGH (ref 0.0–5.0)
Cholesterol, Total: 261 mg/dL — ABNORMAL HIGH (ref 100–199)
HDL: 41 mg/dL (ref >39)
LDL Chol Calc (NIH): 178 mg/dL — ABNORMAL HIGH (ref 0–99)
Triglycerides: 221 mg/dL — ABNORMAL HIGH (ref 0–149)
VLDL Cholesterol Cal: 42 mg/dL — ABNORMAL HIGH (ref 5–40)

## 2022-06-03 LAB — URINE CYTOLOGY ANCILLARY ONLY
Chlamydia: NEGATIVE
Comment: NEGATIVE
Comment: NEGATIVE
Comment: NORMAL
Neisseria Gonorrhea: NEGATIVE
Trichomonas: NEGATIVE

## 2022-06-03 LAB — HSV(HERPES SIMPLEX VRS) I + II AB-IGG
HSV 1 Glycoprotein G Ab, IgG: <0.91 index (ref 0.00–0.90)
HSV 2 IgG, Type Spec: <0.91 index (ref 0.00–0.90)

## 2022-06-03 LAB — HEPATIC FUNCTION PANEL
ALT: 27 IU/L (ref 0–44)
AST: 24 IU/L (ref 0–40)
Albumin: 4.5 g/dL (ref 4.1–5.2)
Alkaline Phosphatase: 73 IU/L (ref 44–121)
Bilirubin Total: <0.2 mg/dL (ref 0.0–1.2)
Bilirubin, Direct: <0.1 mg/dL (ref 0.00–0.40)
Total Protein: 7.5 g/dL (ref 6.0–8.5)

## 2022-06-03 LAB — HEMOGLOBIN A1C
Est. average glucose Bld gHb Est-mCnc: 126 mg/dL
Hgb A1c MFr Bld: 6 % — ABNORMAL HIGH (ref 4.8–5.6)

## 2022-06-03 LAB — RPR: RPR Ser Ql: NONREACTIVE

## 2022-06-03 LAB — HIV ANTIBODY (ROUTINE TESTING W REFLEX): HIV Screen 4th Generation wRfx: NONREACTIVE

## 2022-06-03 MED ORDER — ATORVASTATIN CALCIUM 20 MG PO TABS: 20.0000 mg | ORAL_TABLET | Freq: Every day | ORAL | 2 refills | Status: DC

## 2022-06-10 ENCOUNTER — Ambulatory Visit: Payer: BC Managed Care – PPO | Admitting: Family

## 2022-06-14 NOTE — Progress Notes (Deleted)
Office Visit    Patient Name: Victor Burns Date of Encounter: 06/14/2022  PCP:  No primary care provider on file.   [facility]  Cardiologist:  Lesleigh Noe, MD  Advanced Practice Provider:  No care team member to display Electrophysiologist:  None    Chief Complaint    Victor Burns is a 31 y.o. male with a hx of recurrent chest pain leading to multiple ER visits, asthma, perirectal abscess presents today for hospital follow-up.  He uses tobacco, marijuana, cocaine, and alcohol all intermittently.  He was seen 9/22 by Dr. Katrinka Blazing.  He endorsed 7 or 8 months prior to caring barbecue back in his house while intoxicated.  He had a significant fall onto his left side of his chest and arm.  He felt okay immediately afterwards but then started to feel stiff, sore, and had difficulty with his left arm.  No medical attention was sought at that time.  Since the time of his fall he has had reoccurring episodes of left axillary and left lateral chest, left arm, and occasional back discomfort.  The discomfort occurs randomly.  He is unable to sleep on his left side because it precipitates pain from time to time.  On occasion, the discomfort is unbearable.  When this occurs, he goes to the emergency room.  He underwent an echocardiogram 09/29/2021 which was normal.  Exercise tolerance test 10/16/2021 showed no evidence of ischemia/CAD.  Chest pain was determined not to be cardiac.  Today, he ***  Past Medical History    Past Medical History:  Diagnosis Date   Abscess    Arthritis    knee, back   Asthma    Past Surgical History:  Procedure Laterality Date   EVALUATION UNDER ANESTHESIA WITH HEMORRHOIDECTOMY N/A 08/08/2019   Procedure: EXAM UNDER ANESTHESIA WITH IRRIGATION AND DRAINAGE OF PERIRECTAL ABSCESS;  Surgeon: Andria Meuse, MD;  Location: [facility];  Service: General;  Laterality: N/A;   FISTULOTOMY N/A 02/20/2020   Procedure: PARTIAL  FISTULOTOMY WITH  PLACEMENT OF CUTTING TETON;  Surgeon: Andria Meuse, MD;  Location: [facility];  Service: General;  Laterality: N/A;   INCISION AND DRAINAGE PERIRECTAL ABSCESS N/A 06/16/2018   Procedure: Francia Greaves UNDER ANESTHESIA IRRIGATION AND DEBRIDEMENT PERIRECTAL ABSCESS;  Surgeon: Emelia Loron, MD;  Location: [facility];  Service: General;  Laterality: N/A;   KNEE ARTHROSCOPY     PLACEMENT OF SETON  08/08/2019   Procedure: PLACEMENT OF DRAINING SETON;  Surgeon: Andria Meuse, MD;  Location: [facility];  Service: General;;   RECTAL EXAM UNDER ANESTHESIA N/A 02/20/2020   Procedure: ANORECTAL EXAM UNDER ANESTHESIA;  Surgeon: Andria Meuse, MD;  Location: [facility];  Service: General;  Laterality: N/A;    Allergies  Allergies  Allergen Reactions   Bee Venom Anaphylaxis   Shellfish Allergy Anaphylaxis   Compazine [Prochlorperazine] Other (See Comments)    Trembling, feeling hot.    Other Itching    CHG wipes     EKGs/Labs/Other Studies Reviewed:   The following studies were reviewed today:  10/22 echocardiogram IMPRESSIONS     1. Left ventricular ejection fraction by 3D volume is 63 %. The left  ventricle has no regional wall motion abnormalities. Left ventricular  diastolic parameters were normal. The average left ventricular global  longitudinal strain is -27.5 %. The global  longitudinal strain is normal.   2. Right ventricular systolic function is normal. The right ventricular  size is normal. There is  normal pulmonary artery systolic pressure.   3. The mitral valve is normal in structure. No evidence of mitral valve  regurgitation.   4. The aortic valve is normal in structure. Aortic valve regurgitation is  not visualized. No aortic stenosis is present.   5. The inferior vena cava is normal in size with greater than 50%  respiratory variability, suggesting right atrial pressure of 3 mmHg.   Comparison(s): No prior Echocardiogram.   Exercise stress test 10/2021   No ST  deviation was noted.   Prior study not available for comparison.   ETT with good exercise tolerance (9:00); no CP; normal BP response; no ST changes; negative adequate ETT.  EKG:  EKG is *** ordered today.  The ekg ordered today demonstrates ***  Recent Labs: 05/23/2022: BUN 17; Creatinine, Ser 1.00; Hemoglobin 12.9; Platelets 315; Potassium 4.0; Sodium 137 06/02/2022: ALT 27  Recent Lipid Panel    Component Value Date/Time   CHOL 261 (H) 06/02/2022 0956   TRIG 221 (H) 06/02/2022 0956   HDL 41 06/02/2022 0956   CHOLHDL 6.4 (H) 06/02/2022 0956   LDLCALC 178 (H) 06/02/2022 0956    Risk Assessment/Calculations:  {Does this patient have ATRIAL FIBRILLATION?:[phone]}  Home Medications   No outpatient medications have been marked as taking for the 06/15/22 encounter (Appointment) with Sharlene Dory, PA-C.     Review of Systems   ***   All other systems reviewed and are otherwise negative except as noted above.  Physical Exam    VS:  There were no vitals taken for this visit. , BMI There is no height or weight on file to calculate BMI.  Wt Readings from Last 3 Encounters:  06/02/22 250 lb (113.4 kg)  05/23/22 244 lb 14.9 oz (111.1 kg)  05/04/22 245 lb (111.1 kg)     GEN: Well nourished, well developed, in no acute distress. HEENT: normal. Neck: Supple, no JVD, carotid bruits, or masses. Cardiac: ***RRR, no murmurs, rubs, or gallops. No clubbing, cyanosis, edema.  ***Radials/PT 2+ and equal bilaterally.  Respiratory:  ***Respirations regular and unlabored, clear to auscultation bilaterally. GI: Soft, nontender, nondistended. MS: No deformity or atrophy. Skin: Warm and dry, no rash. Neuro:  Strength and sensation are intact. Psych: Normal affect.  Assessment & Plan    Chest pain of uncertain etiology -Exercise treadmill test performed last fall was negative for ischemia -2D echocardiogram from last fall with normal heart function, no significant valvular disease      Disposition: Follow up {follow up:15908} with Lesleigh Noe, MD or APP.  Signed, Sharlene Dory, PA-C 06/14/2022, 9:40 PM [facility]

## 2022-06-15 ENCOUNTER — Encounter (HOSPITAL_COMMUNITY): Payer: Self-pay

## 2022-06-15 ENCOUNTER — Emergency Department (HOSPITAL_COMMUNITY)
Admission: EM | Admit: 2022-06-15 | Discharge: 2022-06-15 | Discharge status: Against Medical Advice | Payer: BC Managed Care – PPO | Attending: Emergency Medicine | Admitting: Emergency Medicine

## 2022-06-15 ENCOUNTER — Emergency Department (HOSPITAL_COMMUNITY): Payer: BC Managed Care – PPO

## 2022-06-15 ENCOUNTER — Ambulatory Visit: Payer: BC Managed Care – PPO | Admitting: Physician Assistant

## 2022-06-15 ENCOUNTER — Other Ambulatory Visit: Payer: Self-pay

## 2022-06-15 DIAGNOSIS — R0602 Shortness of breath: Secondary | ICD-10-CM | POA: Insufficient documentation

## 2022-06-15 DIAGNOSIS — R079 Chest pain, unspecified: Secondary | ICD-10-CM | POA: Insufficient documentation

## 2022-06-15 DIAGNOSIS — J45909 Unspecified asthma, uncomplicated: Secondary | ICD-10-CM | POA: Insufficient documentation

## 2022-06-15 DIAGNOSIS — Z5321 Procedure and treatment not carried out due to patient leaving prior to being seen by health care provider: Secondary | ICD-10-CM | POA: Diagnosis not present

## 2022-06-15 DIAGNOSIS — R0789 Other chest pain: Secondary | ICD-10-CM | POA: Diagnosis not present

## 2022-06-15 LAB — CBC
HCT: 39.9 % (ref 39.0–52.0)
Hemoglobin: 13.1 g/dL (ref 13.0–17.0)
MCH: 29.8 pg (ref 26.0–34.0)
MCHC: 32.8 g/dL (ref 30.0–36.0)
MCV: 90.9 fL (ref 80.0–100.0)
Platelets: 310 10*3/uL (ref 150–400)
RBC: 4.39 MIL/uL (ref 4.22–5.81)
RDW: 13.1 % (ref 11.5–15.5)
WBC: 3.5 10*3/uL — ABNORMAL LOW (ref 4.0–10.5)
nRBC: 0 % (ref 0.0–0.2)

## 2022-06-15 LAB — BASIC METABOLIC PANEL
Anion gap: 6 (ref 5–15)
BUN: 15 mg/dL (ref 6–20)
CO2: 24 mmol/L (ref 22–32)
Calcium: 9.6 mg/dL (ref 8.9–10.3)
Chloride: 108 mmol/L (ref 98–111)
Creatinine, Ser: 0.92 mg/dL (ref 0.61–1.24)
GFR, Estimated: >60 mL/min (ref >60)
Glucose, Bld: 101 mg/dL — ABNORMAL HIGH (ref 70–99)
Potassium: 3.7 mmol/L (ref 3.5–5.1)
Sodium: 138 mmol/L (ref 135–145)

## 2022-06-15 LAB — TROPONIN I (HIGH SENSITIVITY): Troponin I (High Sensitivity): 4 ng/L (ref <18)

## 2022-06-15 NOTE — ED Triage Notes (Signed)
Patient states that he came to the ED for SOB/asthma. Patient states that he had a cardiology appointment today at 1530 and came to the ED instead. Patient states while en route to the ED he began having left chest pain.

## 2022-06-15 NOTE — ED Provider Triage Note (Signed)
Emergency Medicine Provider Triage Evaluation Note  Victor Burns , a 31 y.o. male  was evaluated in triage.  Pt complains of cp. Report recurrent pain in chest and SOB for a while. Seen by PCP but had appointment to be seen by cardiology today.  Report worsening chest pain so came here.  No fever, productive cough. Yes to wheeze  Review of Systems  Positive: As above Negative: As above  Physical Exam  BP (!) 146/94 (BP Location: Right Arm)   Pulse (!) 57   Temp 98.1 F (36.7 C) (Oral)   Resp 16   Ht 5\' 11"  (1.803 m)   Wt 113.4 kg   SpO2 100%   BMI 34.87 kg/m  Gen:   Awake, no distress   Resp:  Normal effort  MSK:   Moves extremities without difficulty  Other:    Medical Decision Making  Medically screening exam initiated at 4:10 PM.  Appropriate orders placed.  Ivann Trimarco was informed that the remainder of the evaluation will be completed by another provider, this initial triage assessment does not replace that evaluation, and the importance of remaining in the ED until their evaluation is complete.     Rolena Infante, PA-C 06/15/22 1614

## 2022-08-09 ENCOUNTER — Encounter (HOSPITAL_COMMUNITY): Payer: Self-pay

## 2022-08-09 ENCOUNTER — Ambulatory Visit (HOSPITAL_COMMUNITY)
Admission: EM | Admit: 2022-08-09 | Discharge: 2022-08-09 | Disposition: A | Discharge status: Home / Self Care | Payer: BC Managed Care – PPO | Attending: Physician Assistant | Admitting: Physician Assistant

## 2022-08-09 ENCOUNTER — Ambulatory Visit (INDEPENDENT_AMBULATORY_CARE_PROVIDER_SITE_OTHER): Payer: BC Managed Care – PPO

## 2022-08-09 DIAGNOSIS — M25461 Effusion, right knee: Secondary | ICD-10-CM | POA: Diagnosis not present

## 2022-08-09 DIAGNOSIS — M25561 Pain in right knee: Secondary | ICD-10-CM | POA: Diagnosis not present

## 2022-08-09 DIAGNOSIS — S83104A Unspecified dislocation of right knee, initial encounter: Secondary | ICD-10-CM | POA: Diagnosis not present

## 2022-08-09 NOTE — ED Triage Notes (Signed)
Pt states right knee popped on Sunday night while giving someone a hug. Pt complains of pain and discomfort. Pt states when trying to walk pain is greater

## 2022-08-09 NOTE — ED Provider Notes (Signed)
[facility]    CSN: [id number] Arrival date & time: 08/09/22  0908      History   Chief Complaint Chief Complaint  Patient presents with   Knee Pain    HPI Mariano Doshi is a 31 y.o. male.   Pt complains right knee pain that started last night.  Reports he was sitting in a chair and went to hug someone and when getting up his knee popped.  He reports he has dislocated his patella in the past and the episode felt similar.  He is having trouble with bending the knee today.  Pain is worse with ambulation. He has tried nothing for the sx.     Past Medical History:  Diagnosis Date   Abscess    Arthritis    knee, back   Asthma     Patient Active Problem List   Diagnosis Date Noted   Prediabetes 06/03/2022   Hyperlipidemia 06/03/2022   GERD (gastroesophageal reflux disease) 11/24/2021   Perirectal abscess s/p I&D 06/16/2018 06/16/2018   Alcohol abuse 06/16/2018   Mild persistent asthma 10/07/2017    Past Surgical History:  Procedure Laterality Date   EVALUATION UNDER ANESTHESIA WITH HEMORRHOIDECTOMY N/A 08/08/2019   Procedure: EXAM UNDER ANESTHESIA WITH IRRIGATION AND DRAINAGE OF PERIRECTAL ABSCESS;  Surgeon: Andria Meuse, MD;  Location: [facility];  Service: General;  Laterality: N/A;   FISTULOTOMY N/A 02/20/2020   Procedure: PARTIAL  FISTULOTOMY WITH PLACEMENT OF CUTTING TETON;  Surgeon: Andria Meuse, MD;  Location: [facility];  Service: General;  Laterality: N/A;   INCISION AND DRAINAGE PERIRECTAL ABSCESS N/A 06/16/2018   Procedure: Francia Greaves UNDER ANESTHESIA IRRIGATION AND DEBRIDEMENT PERIRECTAL ABSCESS;  Surgeon: Emelia Loron, MD;  Location: [facility];  Service: General;  Laterality: N/A;   KNEE ARTHROSCOPY     PLACEMENT OF SETON  08/08/2019   Procedure: PLACEMENT OF DRAINING SETON;  Surgeon: Andria Meuse, MD;  Location: [facility];  Service: General;;   RECTAL EXAM UNDER ANESTHESIA N/A 02/20/2020   Procedure: ANORECTAL EXAM UNDER ANESTHESIA;  Surgeon:  Andria Meuse, MD;  Location: [facility];  Service: General;  Laterality: N/A;       Home Medications    Prior to Admission medications   Medication Sig Start Date End Date Taking? Authorizing Provider  acetaminophen (TYLENOL) 500 MG tablet Take 500 mg by mouth every 6 (six) hours as needed.    [provider]  albuterol (VENTOLIN HFA) 108 (90 Base) MCG/ACT inhaler Inhale 1-2 puffs into the lungs every 6 (six) hours as needed for wheezing or shortness of breath. 10/18/21   Rushie Chestnut, PA-C  atorvastatin (LIPITOR) 20 MG tablet Take 1 tablet (20 mg total) by mouth daily. 06/03/22 09/01/22  Rema Fendt, NP  budesonide-formoterol (SYMBICORT) 160-4.5 MCG/ACT inhaler Inhale 2 puffs into the lungs in the morning and at bedtime. 12/18/21   Mannam, Colbert Coyer, MD  ibuprofen (ADVIL) 600 MG tablet Take 600 mg by mouth every 6 (six) hours as needed.    [provider]  omeprazole (PRILOSEC) 20 MG capsule Take 1 capsule (20 mg total) by mouth daily. 06/02/22 11/29/22  Rema Fendt, NP    Family History Family History  Problem Relation Age of Onset   Hypertension Mother    Hypertension Father     Social History Social History   Tobacco Use   Smoking status: Some Days    Packs/day: 0.25    Years: 10.00    Total pack years: 2.50  Types: Cigarettes    Passive exposure: Current   Smokeless tobacco: Never   Tobacco comments:    Smokes on weekends   Vaping Use   Vaping Use: Never used  Substance Use Topics   Alcohol use: Yes    Alcohol/week: 10.0 standard drinks of alcohol    Types: 10 Cans of beer per week    Comment: 2 bottles liquor/ per wk   Drug use: Yes    Frequency: 3.0 times per week    Types: Marijuana    Comment: Pt reports smoking every other day (2g)     Allergies   Bee venom, Shellfish allergy, Compazine [prochlorperazine], and Other   Review of Systems Review of Systems  Constitutional:  Negative for chills and fever.  HENT:   Negative for ear pain and sore throat.   Eyes:  Negative for pain and visual disturbance.  Respiratory:  Negative for cough and shortness of breath.   Cardiovascular:  Negative for chest pain and palpitations.  Gastrointestinal:  Negative for abdominal pain and vomiting.  Genitourinary:  Negative for dysuria and hematuria.  Musculoskeletal:  Positive for arthralgias (right knee pain). Negative for back pain.  Skin:  Negative for color change and rash.  Neurological:  Negative for seizures and syncope.  All other systems reviewed and are negative.    Physical Exam Triage Vital Signs ED Triage Vitals  Enc Vitals Group     BP 08/09/22 0937 118/81     Pulse Rate 08/09/22 0937 69     Resp 08/09/22 0937 12     Temp 08/09/22 0937 98 F (36.7 C)     Temp src --      SpO2 08/09/22 0937 98 %     Weight 08/09/22 0936 250 lb (113.4 kg)     Height 08/09/22 0936 5\' 11"  (1.803 m)     Head Circumference --      Peak Flow --      Pain Score 08/09/22 0935 8     Pain Loc --      Pain Edu? --      Excl. in GC? --    No data found.  Updated Vital Signs BP 118/81 (BP Location: Left Arm)   Pulse 69   Temp 98 F (36.7 C)   Resp 12   Ht 5\' 11"  (1.803 m)   Wt 250 lb (113.4 kg)   SpO2 98%   BMI 34.87 kg/m   Visual Acuity Right Eye Distance:   Left Eye Distance:   Bilateral Distance:    Right Eye Near:   Left Eye Near:    Bilateral Near:     Physical Exam Vitals and nursing note reviewed.  Constitutional:      General: He is not in acute distress.    Appearance: He is well-developed.  HENT:     Head: Normocephalic and atraumatic.  Eyes:     Conjunctiva/sclera: Conjunctivae normal.  Cardiovascular:     Rate and Rhythm: Normal rate and regular rhythm.     Heart sounds: No murmur heard. Pulmonary:     Effort: Pulmonary effort is normal. No respiratory distress.     Breath sounds: Normal breath sounds.  Abdominal:     Palpations: Abdomen is soft.     Tenderness: There is no  abdominal tenderness.  Musculoskeletal:        General: No swelling.     Cervical back: Neck supple.     Right knee: Tenderness present over the medial joint  line. No LCL laxity, MCL laxity, ACL laxity or PCL laxity. Normal alignment. Normal pulse.     Comments: Right knee, mild swelling.   Skin:    General: Skin is warm and dry.     Capillary Refill: Capillary refill takes less than 2 seconds.  Neurological:     Mental Status: He is alert.  Psychiatric:        Mood and Affect: Mood normal.      UC Treatments / Results  Labs (all labs ordered are listed, but only abnormal results are displayed) Labs Reviewed - No data to display  EKG   Radiology DG Knee 2 Views Right  Result Date: 08/09/2022 CLINICAL DATA:  dislocation, pain EXAM: RIGHT KNEE - 1-2 VIEW COMPARISON:  None Available. FINDINGS: There is no evidence of acute fracture. Alignment is normal. There is no significant arthritis. There is a moderate-sized joint effusion. IMPRESSION: Moderate-sized joint effusion.  No visible fracture. Electronically Signed   By: Caprice Renshaw M.D.   On: 08/09/2022 10:17    Procedures Procedures (including critical care time)  Medications Ordered in UC Medications - No data to display  Initial Impression / Assessment and Plan / UC Course  I have reviewed the triage vital signs and the nursing notes.  Pertinent labs & imaging results that were available during my care of the patient were reviewed by me and considered in my medical decision making (see chart for details).     Right knee pain, imaging with normal alignment, effusion.  H/o patellar dislocation in the past, this is likely what occurred yesterday.  Knee sleeve given today.  Advised ice, elevation, rest.  Advised follow up with ortho if no improvement or sx become worse.  Final Clinical Impressions(s) / UC Diagnoses   Final diagnoses:  Acute pain of right knee     Discharge Instructions      Recommend ice, elevation,  Ibuprofen as needed Wear knee sleeve as needed Follow up with orthopedic if no improvement      ED Prescriptions   None    PDMP not reviewed this encounter.   Ward, Tylene Fantasia, PA-C 08/09/22 1038

## 2022-08-09 NOTE — Discharge Instructions (Addendum)
Recommend ice, elevation, Ibuprofen as needed Wear knee sleeve as needed Follow up with orthopedic if no improvement

## 2022-08-23 ENCOUNTER — Encounter: Payer: Self-pay | Admitting: Gastroenterology

## 2022-09-13 ENCOUNTER — Encounter (HOSPITAL_COMMUNITY): Payer: Self-pay | Admitting: Emergency Medicine

## 2022-09-13 ENCOUNTER — Ambulatory Visit (HOSPITAL_COMMUNITY)
Admission: EM | Admit: 2022-09-13 | Discharge: 2022-09-13 | Disposition: A | Discharge status: Home / Self Care | Payer: BC Managed Care – PPO | Attending: Emergency Medicine | Admitting: Emergency Medicine

## 2022-09-13 DIAGNOSIS — R519 Headache, unspecified: Secondary | ICD-10-CM

## 2022-09-13 MED ORDER — DIPHENHYDRAMINE HCL 25 MG PO CAPS
50.0000 mg | ORAL_CAPSULE | Freq: Once | ORAL | Status: AC
  Administered 2022-09-13: 50 mg via ORAL

## 2022-09-13 MED ORDER — DEXAMETHASONE SODIUM PHOSPHATE 10 MG/ML IJ SOLN
INTRAMUSCULAR | Status: AC
  Filled 2022-09-13: qty 1

## 2022-09-13 MED ORDER — IBUPROFEN 800 MG PO TABS: 800.0000 mg | ORAL_TABLET | Freq: Three times a day (TID) | ORAL | 0 refills | Status: DC

## 2022-09-13 MED ORDER — DEXAMETHASONE SODIUM PHOSPHATE 10 MG/ML IJ SOLN
10.0000 mg | Freq: Once | INTRAMUSCULAR | Status: AC
  Administered 2022-09-13: 10 mg via INTRAMUSCULAR

## 2022-09-13 MED ORDER — KETOROLAC TROMETHAMINE 30 MG/ML IJ SOLN: 30.0000 mg | Freq: Once | INTRAMUSCULAR | Status: DC

## 2022-09-13 MED ORDER — KETOROLAC TROMETHAMINE 30 MG/ML IJ SOLN
INTRAMUSCULAR | Status: AC
  Filled 2022-09-13: qty 1

## 2022-09-13 MED ORDER — DIPHENHYDRAMINE HCL 25 MG PO CAPS
ORAL_CAPSULE | ORAL | Status: AC
  Filled 2022-09-13: qty 2

## 2022-09-13 MED ORDER — KETOROLAC TROMETHAMINE 30 MG/ML IJ SOLN
30.0000 mg | Freq: Once | INTRAMUSCULAR | Status: AC
  Administered 2022-09-13: 30 mg via INTRAMUSCULAR

## 2022-09-13 NOTE — ED Triage Notes (Signed)
Pt c/o headache that started this morning. Reports drank a lot of ETOH this past weekend. Taking total of 2000 mg of Tylenol without relief.

## 2022-09-13 NOTE — Discharge Instructions (Addendum)
  Today in office she has been given an injection of Toradol and Decadron to help reduce inflammation and released the pressure from her headache, ideally you will start to have resolution in about 30 minutes  Please do not take more than 1000 mg of Tylenol at a time as this may cause irritation or damage to your liver, may take Tylenol 325 mg to 1000 mg every 6 hours as needed  May use ibuprofen 600 to 800 mg every 6-8 hours as needed in addition to Tylenol  While headaches are present participate in low stimulation activities with avoidance of loud noises and bright lights  Ensure that you are getting adequate fluid intake through use of water as dehydration may worsen your headaches  Ensure that you are getting adequate rest, it is recommended that she sleep at least 6 to 8 hours per night as fatigue will worsen your symptoms  At any point if you begin to experience the worst headache of your life time please go to the nearest emergency department for further evaluation and management

## 2022-09-13 NOTE — ED Provider Notes (Signed)
[facility]    CSN: [id number] Arrival date & time: 09/13/22  1815      History   Chief Complaint Chief Complaint  Patient presents with   Headache    HPI Victor Burns is a 31 y.o. male.   Patient presents with a constant right-sided headache beginning this morning.  Endorses that over the weekend he drank an excessive amount of alcohol and was smoking and now feels dehydrated he has had similar headaches in the past.  Has been able to tolerate fluids and has been able to drink an entire bottle of Gatorade today.  Attempted use of 2000 mg of Tylenol which were ineffective.  Denies photophobia, photophobia, visual disturbance, dizziness, lightheadedness, weakness, memory or speech changes.     Past Medical History:  Diagnosis Date   Abscess    Arthritis    knee, back   Asthma     Patient Active Problem List   Diagnosis Date Noted   Prediabetes 06/03/2022   Hyperlipidemia 06/03/2022   GERD (gastroesophageal reflux disease) 11/24/2021   Perirectal abscess s/p I&D 06/16/2018 06/16/2018   Alcohol abuse 06/16/2018   Mild persistent asthma 10/07/2017    Past Surgical History:  Procedure Laterality Date   EVALUATION UNDER ANESTHESIA WITH HEMORRHOIDECTOMY N/A 08/08/2019   Procedure: EXAM UNDER ANESTHESIA WITH IRRIGATION AND DRAINAGE OF PERIRECTAL ABSCESS;  Surgeon: Andria Meuse, MD;  Location: [facility];  Service: General;  Laterality: N/A;   FISTULOTOMY N/A 02/20/2020   Procedure: PARTIAL  FISTULOTOMY WITH PLACEMENT OF CUTTING TETON;  Surgeon: Andria Meuse, MD;  Location: [facility];  Service: General;  Laterality: N/A;   INCISION AND DRAINAGE PERIRECTAL ABSCESS N/A 06/16/2018   Procedure: Francia Greaves UNDER ANESTHESIA IRRIGATION AND DEBRIDEMENT PERIRECTAL ABSCESS;  Surgeon: Emelia Loron, MD;  Location: [facility];  Service: General;  Laterality: N/A;   KNEE ARTHROSCOPY     PLACEMENT OF SETON  08/08/2019   Procedure: PLACEMENT OF DRAINING SETON;  Surgeon: Andria Meuse, MD;  Location: [facility];  Service: General;;   RECTAL EXAM UNDER ANESTHESIA N/A 02/20/2020   Procedure: ANORECTAL EXAM UNDER ANESTHESIA;  Surgeon: Andria Meuse, MD;  Location: [facility];  Service: General;  Laterality: N/A;       Home Medications    Prior to Admission medications   Medication Sig Start Date End Date Taking? Authorizing Provider  acetaminophen (TYLENOL) 500 MG tablet Take 500 mg by mouth every 6 (six) hours as needed.    [provider]  albuterol (VENTOLIN HFA) 108 (90 Base) MCG/ACT inhaler Inhale 1-2 puffs into the lungs every 6 (six) hours as needed for wheezing or shortness of breath. 10/18/21   Rushie Chestnut, PA-C  atorvastatin (LIPITOR) 20 MG tablet Take 1 tablet (20 mg total) by mouth daily. 06/03/22 09/01/22  Rema Fendt, NP  budesonide-formoterol (SYMBICORT) 160-4.5 MCG/ACT inhaler Inhale 2 puffs into the lungs in the morning and at bedtime. 12/18/21   Mannam, Colbert Coyer, MD  ibuprofen (ADVIL) 600 MG tablet Take 600 mg by mouth every 6 (six) hours as needed.    [provider]  omeprazole (PRILOSEC) 20 MG capsule Take 1 capsule (20 mg total) by mouth daily. 06/02/22 11/29/22  Rema Fendt, NP    Family History Family History  Problem Relation Age of Onset   Hypertension Mother    Hypertension Father     Social History Social History   Tobacco Use   Smoking status: Some Days    Packs/day: 0.25  Years: 10.00    Total pack years: 2.50    Types: Cigarettes    Passive exposure: Current   Smokeless tobacco: Never   Tobacco comments:    Smokes on weekends   Vaping Use   Vaping Use: Never used  Substance Use Topics   Alcohol use: Yes    Alcohol/week: 10.0 standard drinks of alcohol    Types: 10 Cans of beer per week    Comment: 2 bottles liquor/ per wk   Drug use: Yes    Frequency: 3.0 times per week    Types: Marijuana    Comment: Pt reports smoking every other day (2g)     Allergies   Bee venom,  Shellfish allergy, Compazine [prochlorperazine], and Other   Review of Systems Review of Systems  Constitutional: Negative.   HENT: Negative.    Respiratory: Negative.    Cardiovascular: Negative.   Skin: Negative.   Neurological:  Positive for headaches. Negative for dizziness, tremors, seizures, syncope, facial asymmetry, speech difficulty, weakness, light-headedness and numbness.     Physical Exam Triage Vital Signs ED Triage Vitals  Enc Vitals Group     BP 09/13/22 1938 (!) 139/93     Pulse Rate 09/13/22 1938 (!) 58     Resp 09/13/22 1938 14     Temp 09/13/22 1938 98.2 F (36.8 C)     Temp Source 09/13/22 1938 Oral     SpO2 09/13/22 1938 99 %     Weight --      Height --      Head Circumference --      Peak Flow --      Pain Score 09/13/22 1937 7     Pain Loc --      Pain Edu? --      Excl. in GC? --    No data found.  Updated Vital Signs BP (!) 139/93 (BP Location: Left Arm)   Pulse (!) 58   Temp 98.2 F (36.8 C) (Oral)   Resp 14   SpO2 99%   Visual Acuity Right Eye Distance:   Left Eye Distance:   Bilateral Distance:    Right Eye Near:   Left Eye Near:    Bilateral Near:     Physical Exam Constitutional:      Appearance: Normal appearance. He is well-developed.  Eyes:     Extraocular Movements: Extraocular movements intact.  Pulmonary:     Effort: Pulmonary effort is normal.  Neurological:     General: No focal deficit present.     Mental Status: He is alert and oriented to person, place, and time. Mental status is at baseline.     Cranial Nerves: No cranial nerve deficit.     Sensory: No sensory deficit.     Motor: No weakness.     Coordination: Coordination normal.     Gait: Gait normal.  Psychiatric:        Mood and Affect: Mood normal.        Behavior: Behavior normal.      UC Treatments / Results  Labs (all labs ordered are listed, but only abnormal results are displayed) Labs Reviewed - No data to  display  EKG   Radiology No results found.  Procedures Procedures (including critical care time)  Medications Ordered in UC Medications - No data to display  Initial Impression / Assessment and Plan / UC Course  I have reviewed the triage vital signs and the nursing notes.  Pertinent labs & imaging  results that were available during my care of the patient were reviewed by me and considered in my medical decision making (see chart for details).  Bad headache  Vital signs are stable and patient is in no signs of distress nor toxic appearing, no abnormalities noted to the neurological exam and therefore we will move forward with treatment of headache, Toradol and Decadron injection given in office, patient endorses that last time he was given Toradol that he had a warm sensation shoot throughout his body and required a dose of Benadryl after which subsided symptoms, 50 mg of Benadryl given for currently, as patient is able to rehydrate orally will not administer IV fluids, discussed with patient, prescribed ibuprofen 800 mg while at home and recommended adequate fluid intake, adequate rest until symptoms subside, given strict precautions for headache that is the worst he is experiencing most like time to go to the nearest emergency department for further evaluation and treatment Final Clinical Impressions(s) / UC Diagnoses   Final diagnoses:  Bad headache     Discharge Instructions       Today in office she has been given an injection of Toradol and Decadron to help reduce inflammation and released the pressure from her headache, ideally you will start to have resolution in about 30 minutes  Please do not take more than 1000 mg of Tylenol at a time as this may cause irritation or damage to your liver, may take Tylenol 325 mg to 1000 mg every 6 hours as needed  May use ibuprofen 600 to 800 mg every 6-8 hours as needed in addition to Tylenol  While headaches are present participate  in low stimulation activities with avoidance of loud noises and bright lights  Ensure that you are getting adequate fluid intake through use of water as dehydration may worsen your headaches  Ensure that you are getting adequate rest, it is recommended that she sleep at least 6 to 8 hours per night as fatigue will worsen your symptoms  At any point if you begin to experience the worst headache of your life time please go to the nearest emergency department for further evaluation and management   ED Prescriptions   None    PDMP not reviewed this encounter.   Hans Eden, NP 09/13/22 [phone]

## 2022-09-29 ENCOUNTER — Encounter: Payer: Self-pay | Admitting: Gastroenterology

## 2022-09-29 ENCOUNTER — Ambulatory Visit (INDEPENDENT_AMBULATORY_CARE_PROVIDER_SITE_OTHER): Payer: BC Managed Care – PPO | Admitting: Gastroenterology

## 2022-09-29 VITALS — BP 142/74 | HR 69 | Ht 71.0 in | Wt 255.2 lb

## 2022-09-29 DIAGNOSIS — R1030 Lower abdominal pain, unspecified: Secondary | ICD-10-CM

## 2022-09-29 DIAGNOSIS — K219 Gastro-esophageal reflux disease without esophagitis: Secondary | ICD-10-CM

## 2022-09-29 DIAGNOSIS — K603 Anal fistula: Secondary | ICD-10-CM

## 2022-09-29 MED ORDER — DICYCLOMINE HCL 20 MG PO TABS: 20.0000 mg | ORAL_TABLET | Freq: Four times a day (QID) | ORAL | 1 refills | Status: DC | PRN

## 2022-09-29 NOTE — Progress Notes (Signed)
HPI : Victor Burns is a very pleasant 31 male with a history of anxiety and asthma who is referred to [place] by Delfin Gant, NP for further evaluation of chronic abdominal pain and GERD symptoms.  The patient states that he has been having issues with abdominal pain for several months now, but where is the pain used to be intermittent/infrequent, and how its most every day.  The pain is located in the lower abdomen, and can involve the left, middle and right lower quadrant.  It is sharp in character, up to 7/10 in severity and typically last minutes at a time, usually between 10 and 15 minutes.  It is often associated with urge to defecate, and defecation typically improves his pain.  It is experienced more often in the mornings. He reports having regular bowel movements, but stools vary in consistency, often loose or mushy.  Very rarely has hard stools.  The patient has a history of perianal abscesses and perianal fistula.  He underwent an exam under anesthesia and seton placement in 2021.  He has not had any problems with perianal symptoms since then.  He does experience occasional bright red blood per rectum which has been going on for many years and attributed to hemorrhoids.  He has no family history of inflammatory bowel disease or autoimmune disease.  Patient also reports having excessive belching and recurrent heartburn and acid regurgitation.  The heartburn and acid regurgitation are associated with certain foods, particularly spicy and fatty foods and carbonated beverages.  Patient states he cannot eat spicy food, use hot sauce or drink sodas anymore.  If he avoids trigger foods, he typically does not have any reflux symptoms, but does still have excessive belching.  He denies nocturnal symptoms.  He does not drink coffee, but he does smoke occasionally.  He is trying to quit.  He has not noticed symptoms if he drinks small amounts of alcohol, but if he drinks too much, he will have  reflux. Patient reports he has gained significant weight over the past 4 years, estimating about 100 pounds.  He currently weighs 255 pounds.  Review of weights in EMR show in April 2017 he weighed 173 pounds.   Past Medical History:  Diagnosis Date   Abscess    Anxiety    Arthritis    knee, back   Asthma    Elevated cholesterol    Kidney stones      Past Surgical History:  Procedure Laterality Date   EVALUATION UNDER ANESTHESIA WITH HEMORRHOIDECTOMY N/A 08/08/2019   Procedure: EXAM UNDER ANESTHESIA WITH IRRIGATION AND DRAINAGE OF PERIRECTAL ABSCESS;  Surgeon: Andria Meuse, MD;  Location: [facility];  Service: General;  Laterality: N/A;   FISTULOTOMY N/A 02/20/2020   Procedure: PARTIAL  FISTULOTOMY WITH PLACEMENT OF CUTTING TETON;  Surgeon: Andria Meuse, MD;  Location: [facility];  Service: General;  Laterality: N/A;   INCISION AND DRAINAGE PERIRECTAL ABSCESS N/A 06/16/2018   Procedure: Francia Greaves UNDER ANESTHESIA IRRIGATION AND DEBRIDEMENT PERIRECTAL ABSCESS;  Surgeon: Emelia Loron, MD;  Location: [facility];  Service: General;  Laterality: N/A;   KNEE ARTHROSCOPY     PLACEMENT OF SETON  08/08/2019   Procedure: PLACEMENT OF DRAINING SETON;  Surgeon: Andria Meuse, MD;  Location: [facility];  Service: General;;   RECTAL EXAM UNDER ANESTHESIA N/A 02/20/2020   Procedure: ANORECTAL EXAM UNDER ANESTHESIA;  Surgeon: Andria Meuse, MD;  Location: [facility];  Service: General;  Laterality: N/A;   Family History  Problem Relation Age of Onset   Hypertension Mother    Hypertension Father    Social History   Tobacco Use   Smoking status: Some Days    Packs/day: 0.25    Years: 10.00    Total pack years: 2.50    Types: Cigarettes    Passive exposure: Current   Smokeless tobacco: Never   Tobacco comments:    Smokes on weekends   Vaping Use   Vaping Use: Never used  Substance Use Topics   Alcohol use: Yes    Alcohol/week: 10.0 standard drinks of alcohol    Types: 10 Cans of  beer per week    Comment: 2 bottles liquor/ per wk   Drug use: Yes    Frequency: 3.0 times per week    Types: Marijuana    Comment: Pt reports smoking every other day (2g)   Current Outpatient Medications  Medication Sig Dispense Refill   acetaminophen (TYLENOL) 500 MG tablet Take 500 mg by mouth every 6 (six) hours as needed.     albuterol (VENTOLIN HFA) 108 (90 Base) MCG/ACT inhaler Inhale 1-2 puffs into the lungs every 6 (six) hours as needed for wheezing or shortness of breath. 18 g 0   budesonide-formoterol (SYMBICORT) 160-4.5 MCG/ACT inhaler Inhale 2 puffs into the lungs in the morning and at bedtime. 10.2 g 5   ibuprofen (ADVIL) 800 MG tablet Take 1 tablet (800 mg total) by mouth 3 (three) times daily. 21 tablet 0   atorvastatin (LIPITOR) 20 MG tablet Take 1 tablet (20 mg total) by mouth daily. 30 tablet 2   omeprazole (PRILOSEC) 20 MG capsule Take 1 capsule (20 mg total) by mouth daily. (Patient not taking: Reported on 09/29/2022) 90 capsule 1   No current facility-administered medications for this visit.   Allergies  Allergen Reactions   Bee Venom Anaphylaxis   Shellfish Allergy Anaphylaxis    Pt says not allergic anymore   Compazine [Prochlorperazine] Other (See Comments)    Trembling, feeling hot.    Other Itching    CHG wipes     Review of Systems: All systems reviewed and negative except where noted in HPI.    No results found.  Physical Exam: BP (!) 142/74   Pulse 69   Ht 5\' 11"  (1.803 m)   Wt 255 lb 4 oz (115.8 kg)   BMI 35.60 kg/m  Constitutional: Pleasant,well-developed, African-American male in no acute distress. HEENT: Normocephalic and atraumatic. Conjunctivae are normal. No scleral icterus. Cardiovascular: Normal rate, regular rhythm.  Pulmonary/chest: Effort normal and breath sounds normal. No wheezing, rales or rhonchi. Abdominal: Soft, nondistended, nontender. Bowel sounds active throughout. There are no masses palpable. No  hepatomegaly. Extremities: no edema Neurological: Alert and oriented to person place and time. Skin: Skin is warm and dry. No rashes noted. Psychiatric: Normal mood and affect. Behavior is normal.  CBC    Component Value Date/Time   WBC 3.5 (L) 06/15/2022 1619   RBC 4.39 06/15/2022 1619   HGB 13.1 06/15/2022 1619   HCT 39.9 06/15/2022 1619   PLT 310 06/15/2022 1619   MCV 90.9 06/15/2022 1619   MCV 89.9 05/16/2018 1007   MCH 29.8 06/15/2022 1619   MCHC 32.8 06/15/2022 1619   RDW 13.1 06/15/2022 1619   LYMPHSABS 2.1 05/04/2022 2301   MONOABS 0.3 05/04/2022 2301   EOSABS 0.1 05/04/2022 2301   BASOSABS 0.0 05/04/2022 2301    CMP     Component Value Date/Time   NA 138 06/15/2022 1619  NA 138 05/16/2018 0956   K 3.7 06/15/2022 1619   CL 108 06/15/2022 1619   CO2 24 06/15/2022 1619   GLUCOSE 101 (H) 06/15/2022 1619   BUN 15 06/15/2022 1619   BUN 16 05/16/2018 0956   CREATININE 0.92 06/15/2022 1619   CREATININE 1.01 04/03/2016 1549   CALCIUM 9.6 06/15/2022 1619   PROT 7.5 06/02/2022 0956   ALBUMIN 4.5 06/02/2022 0956   AST 24 06/02/2022 0956   ALT 27 06/02/2022 0956   ALKPHOS 73 06/02/2022 0956   BILITOT <0.2 06/02/2022 0956   GFRNONAA >60 06/15/2022 1619   GFRNONAA >89 04/03/2016 1549   GFRAA >60 04/12/2020 0342   GFRAA >89 04/03/2016 1549     ASSESSMENT AND PLAN: 31 year old male with history of perianal abscesses and fistulas, treated with seton placement in 2021 with no recurrence of symptoms, now with episodic crampy abdominal pain.  His abdominal pain seems more consistent with irritable bowel syndrome/gas pain, but given his history of perianal disease, Crohn's disease is also on the differential.  We discussed performing colonoscopy versus less invasive assessment.  Overall, I feel like the likelihood of Crohn's disease is very low, and favor IBS.  I think a fecal calprotectin would be helpful.  If normal, this is very reassuring.  If elevated, he will need a  colonoscopy.  We will also check H. pylori as a cause of his abdominal pain and excessive belching.  In the meantime, I recommend he start taking Bentyl in the mornings, given the morning predominance of his pain episodes.  Given his loose, poorly formed stools, I also suggested he start taking Metamucil on a daily basis to improve his stool consistency.  Should his pain become more prominent or develop other symptoms such as nausea, vomiting, weight loss or diarrhea, a colonoscopy would be more indicated. With regards to his GERD symptoms, it seems his symptoms are fairly predictable with the foods and beverages he consumes.  Given that he does not routinely have symptoms unless he eats these problem foods, I recommend he continue taking as needed antacids.  I also recommended he try to lose weight and stop smoking, as both these measures will most likely reduce his symptoms and may enable him to eat and drink the things he likes.   Abdominal pain, history of perianal fistula - Fecal calprotectin, H. pylori - Bentyl qam  GERD/belching - Continue PRN TUMs -Behavioral modifications  Loose stools - Metamucil daily  Jatin Naumann E. Tomasa Rand, MD [facility]   CC:  Rema Fendt, NP

## 2022-09-29 NOTE — Patient Instructions (Addendum)
_______________________________________________________  If you are age 30 or older, your body mass index should be between 23-30. Your Body mass index is 35.6 kg/m. If this is out of the aforementioned range listed, please consider follow up with your Primary Care Provider.  If you are age 40 or younger, your body mass index should be between 19-25. Your Body mass index is 35.6 kg/m. If this is out of the aformentioned range listed, please consider follow up with your Primary Care Provider.   We have sent the following medications to your pharmacy for you to pick up at your convenience: Bentyl 20 mg every 6 hour as needed.  Your provider has requested that you go to the basement level for lab work before leaving today. Press "B" on the elevator. The lab is located at the first door on the left as you exit the elevator.   Start Metamucil daily.  Continue Tums as needed.  Smoking cessation and weight loss to improve GERD.  The [facility] providers would like to encourage you to use [facility] to communicate with providers for non-urgent requests or questions.  Due to long hold times on the telephone, sending your provider a message by [facility] may be a faster and more efficient way to get a response.  Please allow 48 business hours for a response.  Please remember that this is for non-urgent requests.   It was a pleasure to see you today!  Thank you for trusting me with your gastrointestinal care!    Scott E.Candis Schatz, MD

## 2022-11-11 ENCOUNTER — Emergency Department (HOSPITAL_COMMUNITY): Payer: BC Managed Care – PPO

## 2022-11-11 ENCOUNTER — Emergency Department (HOSPITAL_COMMUNITY)
Admission: EM | Admit: 2022-11-11 | Discharge: 2022-11-11 | Disposition: A | Discharge status: Home / Self Care | Payer: BC Managed Care – PPO | Attending: Student | Admitting: Student

## 2022-11-11 ENCOUNTER — Other Ambulatory Visit: Payer: Self-pay

## 2022-11-11 ENCOUNTER — Encounter (HOSPITAL_COMMUNITY): Payer: Self-pay

## 2022-11-11 DIAGNOSIS — R059 Cough, unspecified: Secondary | ICD-10-CM | POA: Diagnosis not present

## 2022-11-11 DIAGNOSIS — J029 Acute pharyngitis, unspecified: Secondary | ICD-10-CM | POA: Diagnosis not present

## 2022-11-11 DIAGNOSIS — J101 Influenza due to other identified influenza virus with other respiratory manifestations: Secondary | ICD-10-CM | POA: Insufficient documentation

## 2022-11-11 DIAGNOSIS — Z20822 Contact with and (suspected) exposure to covid-19: Secondary | ICD-10-CM | POA: Insufficient documentation

## 2022-11-11 DIAGNOSIS — Z7951 Long term (current) use of inhaled steroids: Secondary | ICD-10-CM | POA: Diagnosis not present

## 2022-11-11 DIAGNOSIS — J45909 Unspecified asthma, uncomplicated: Secondary | ICD-10-CM | POA: Diagnosis not present

## 2022-11-11 LAB — RESP PANEL BY RT-PCR (FLU A&B, COVID) ARPGX2
Influenza A by PCR: POSITIVE — AB
Influenza B by PCR: NEGATIVE
SARS Coronavirus 2 by RT PCR: NEGATIVE

## 2022-11-11 MED ORDER — ALBUTEROL SULFATE (2.5 MG/3ML) 0.083% IN NEBU: 2.5000 mg | INHALATION_SOLUTION | RESPIRATORY_TRACT | 2 refills | Status: DC | PRN

## 2022-11-11 MED ORDER — PREDNISONE 50 MG PO TABS: ORAL_TABLET | ORAL | 0 refills | Status: DC

## 2022-11-11 MED ORDER — ALBUTEROL SULFATE HFA 108 (90 BASE) MCG/ACT IN AERS: 2.0000 | INHALATION_SPRAY | Freq: Four times a day (QID) | RESPIRATORY_TRACT | 2 refills | Status: DC | PRN

## 2022-11-11 NOTE — ED Provider Triage Note (Signed)
Emergency Medicine Provider Triage Evaluation Note  Victor Burns , a 31 y.o. male  was evaluated in triage.  Pt complains of URI sx for 3 days. Hx of asthma, hasn't used inhaler bc he "knew he needed a treatment." Dry cough, some CP, sore throat, rhinorrhea, etc  Review of Systems  Positive: URI symptoms, chest pain and shortness of breath with the cough Negative: PE risk factors  Physical Exam  BP 137/84   Pulse 71   Temp 99.6 F (37.6 C)   Resp 11   SpO2 98%  Gen:   Awake, no distress   Resp:  Normal effort  MSK:   Moves extremities without difficulty  Other:  RRR, clear lung sounds, mildly decreased air movement  Medical Decision Making  Medically screening exam initiated at 12:15 PM.  Appropriate orders placed.  Eliam Snapp was informed that the remainder of the evaluation will be completed by another provider, this initial triage assessment does not replace that evaluation, and the importance of remaining in the ED until their evaluation is complete.     Saddie Benders, PA-C 11/11/22 1216

## 2022-11-11 NOTE — ED Triage Notes (Signed)
Sore throat, dry cough, runny nose x3 days.  Left sided CP upon waking this am with intermittent sob.  Hx asthma.  Denies bodyaches, fever, chills, sick contacts.

## 2022-11-11 NOTE — ED Provider Notes (Signed)
[facility]-EMERGENCY DEPT Provider Note   CSN: [id number] Arrival date & time: 11/11/22  1147     History  Chief Complaint  Patient presents with   Chest Pain   Cough    Victor Burns is a 31 y.o. male.  Pt complains of cough and bodyaches.  Pt denies any exposures.  Pt has a history of asthma.  Pt states he is out albuterol and albuterol neb solution    The history is provided by the patient. No language interpreter was used.  Chest Pain Pain location:  Unable to specify Pain quality: aching   Pain radiates to:  Does not radiate Pain severity:  Moderate Onset quality:  Gradual Timing:  Constant Associated symptoms: cough   Cough Associated symptoms: chest pain        Home Medications Prior to Admission medications   Medication Sig Start Date End Date Taking? Authorizing Provider  albuterol (PROVENTIL) (2.5 MG/3ML) 0.083% nebulizer solution Take 3 mLs (2.5 mg total) by nebulization every 4 (four) hours as needed for wheezing or shortness of breath. 11/11/22 11/11/23 Yes Elson Areas, PA-C  albuterol (VENTOLIN HFA) 108 (90 Base) MCG/ACT inhaler Inhale 2 puffs into the lungs every 6 (six) hours as needed for wheezing or shortness of breath. 11/11/22  Yes Cheron Schaumann K, PA-C  predniSONE (DELTASONE) 50 MG tablet One tablet a day 11/11/22  Yes Elson Areas, [place]  acetaminophen (TYLENOL) 500 MG tablet Take 500 mg by mouth every 6 (six) hours as needed.    [provider]  atorvastatin (LIPITOR) 20 MG tablet Take 1 tablet (20 mg total) by mouth daily. 06/03/22 09/01/22  Rema Fendt, NP  budesonide-formoterol (SYMBICORT) 160-4.5 MCG/ACT inhaler Inhale 2 puffs into the lungs in the morning and at bedtime. 12/18/21   Mannam, Colbert Coyer, MD  dicyclomine (BENTYL) 20 MG tablet Take 1 tablet (20 mg total) by mouth every 6 (six) hours as needed for spasms. 09/29/22   Jenel Lucks, MD  ibuprofen (ADVIL) 800 MG tablet Take 1 tablet (800 mg total) by  mouth 3 (three) times daily. 09/13/22   White, Elita Boone, NP  omeprazole (PRILOSEC) 20 MG capsule Take 1 capsule (20 mg total) by mouth daily. Patient not taking: Reported on 09/29/2022 06/02/22 11/29/22  Rema Fendt, NP      Allergies    Bee venom, Shellfish allergy, Compazine [prochlorperazine], and Other    Review of Systems   Review of Systems  Respiratory:  Positive for cough.   Cardiovascular:  Positive for chest pain.  All other systems reviewed and are negative.   Physical Exam Updated Vital Signs BP 123/67 (BP Location: Left Arm)   Pulse 78   Temp 98 F (36.7 C) (Oral)   Resp 18   SpO2 98%  Physical Exam Vitals and nursing note reviewed.  Constitutional:      Appearance: He is well-developed.  HENT:     Head: Normocephalic.  Cardiovascular:     Rate and Rhythm: Normal rate.     Heart sounds: Normal heart sounds.  Pulmonary:     Effort: Pulmonary effort is normal.     Breath sounds: Normal breath sounds.  Abdominal:     General: There is no distension.  Musculoskeletal:        General: Normal range of motion.     Cervical back: Normal range of motion.  Neurological:     Mental Status: He is alert and oriented to person, place, and time.  ED Results / Procedures / Treatments   Labs (all labs ordered are listed, but only abnormal results are displayed) Labs Reviewed  RESP PANEL BY RT-PCR (FLU A&B, COVID) ARPGX2 - Abnormal; Notable for the following components:      Result Value   Influenza A by PCR POSITIVE (*)    All other components within normal limits    EKG None  Radiology DG Chest 2 View  Result Date: 11/11/2022 CLINICAL DATA:  Shortness breath sore throat, dry cough EXAM: CHEST - 2 VIEW COMPARISON:  Chest radiograph dated May 16, 2022 FINDINGS: The heart size and mediastinal contours are within normal limits. Both lungs are clear. The visualized skeletal structures are unremarkable. IMPRESSION: No active cardiopulmonary disease.  Electronically Signed   By: Larose Hires D.O.   On: 11/11/2022 12:50    Procedures Procedures    Medications Ordered in ED Medications - No data to display  ED Course/ Medical Decision Making/ A&P                           Medical Decision Making Complains of a cough and bodyaches.  Patient has history of asthma he is out of his albuterol inhaler and neb solution.  Patient normally ends up on prednisone when he gets an upper respiratory infection  Amount and/or Complexity of Data Reviewed Labs: ordered. Decision-making details documented in ED Course.    Details: Was ordered reviewed and interpreted patient is positive for influenza A  Risk Prescription drug management. Risk Details: Is given a prescription for prednisone 50 mg for 6 days albuterol neb solution and an albuterol inhaler patient is stable for discharge           Final Clinical Impression(s) / ED Diagnoses Final diagnoses:  Influenza A  Moderate asthma without complication, unspecified whether persistent    Rx / DC Orders ED Discharge Orders          Ordered    predniSONE (DELTASONE) 50 MG tablet        11/11/22 2003    albuterol (VENTOLIN HFA) 108 (90 Base) MCG/ACT inhaler  Every 6 hours PRN        11/11/22 2003    albuterol (PROVENTIL) (2.5 MG/3ML) 0.083% nebulizer solution  Every 4 hours PRN        11/11/22 2003           An After Visit Summary was printed and given to the patient.    Osie Cheeks 11/11/22 2010    Glendora Score, MD 11/12/22 1200

## 2023-02-11 ENCOUNTER — Encounter: Payer: Self-pay | Admitting: Family Medicine

## 2023-02-11 ENCOUNTER — Ambulatory Visit (INDEPENDENT_AMBULATORY_CARE_PROVIDER_SITE_OTHER): Payer: BC Managed Care – PPO | Admitting: Family Medicine

## 2023-02-11 ENCOUNTER — Other Ambulatory Visit (HOSPITAL_COMMUNITY)
Admission: RE | Admit: 2023-02-11 | Discharge: 2023-02-11 | Disposition: A | Discharge status: Home / Self Care | Payer: BC Managed Care – PPO | Source: Ambulatory Visit | Attending: Family Medicine | Admitting: Family Medicine

## 2023-02-11 VITALS — BP 136/83 | HR 78 | Temp 98.1°F | Resp 16 | Ht 71.0 in | Wt 260.0 lb

## 2023-02-11 DIAGNOSIS — Z113 Encounter for screening for infections with a predominantly sexual mode of transmission: Secondary | ICD-10-CM

## 2023-02-11 DIAGNOSIS — Z130 Encounter for screening for diseases of the blood and blood-forming organs and certain disorders involving the immune mechanism: Secondary | ICD-10-CM

## 2023-02-11 DIAGNOSIS — Z0000 Encounter for general adult medical examination without abnormal findings: Secondary | ICD-10-CM | POA: Diagnosis not present

## 2023-02-11 DIAGNOSIS — Z13220 Encounter for screening for lipoid disorders: Secondary | ICD-10-CM | POA: Diagnosis not present

## 2023-02-11 DIAGNOSIS — Z114 Encounter for screening for human immunodeficiency virus [HIV]: Secondary | ICD-10-CM

## 2023-02-11 MED ORDER — ALBUTEROL SULFATE HFA 108 (90 BASE) MCG/ACT IN AERS: 2.0000 | INHALATION_SPRAY | Freq: Four times a day (QID) | RESPIRATORY_TRACT | 2 refills | Status: DC | PRN

## 2023-02-12 LAB — CBC WITH DIFFERENTIAL/PLATELET
Basophils Absolute: 0 10*3/uL (ref 0.0–0.2)
Basos: 0 %
EOS (ABSOLUTE): 0 10*3/uL (ref 0.0–0.4)
Eos: 1 %
Hematocrit: 41.2 % (ref 37.5–51.0)
Hemoglobin: 13.2 g/dL (ref 13.0–17.7)
Immature Grans (Abs): 0 10*3/uL (ref 0.0–0.1)
Immature Granulocytes: 0 %
Lymphocytes Absolute: 1.7 10*3/uL (ref 0.7–3.1)
Lymphs: 42 %
MCH: 28.9 pg (ref 26.6–33.0)
MCHC: 32 g/dL (ref 31.5–35.7)
MCV: 90 fL (ref 79–97)
Monocytes Absolute: 0.4 10*3/uL (ref 0.1–0.9)
Monocytes: 9 %
Neutrophils Absolute: 2 10*3/uL (ref 1.4–7.0)
Neutrophils: 48 %
Platelets: 323 10*3/uL (ref 150–450)
RBC: 4.57 x10E6/uL (ref 4.14–5.80)
RDW: 13.6 % (ref 11.6–15.4)
WBC: 4 10*3/uL (ref 3.4–10.8)

## 2023-02-12 LAB — CMP14+EGFR
ALT: 49 IU/L — ABNORMAL HIGH (ref 0–44)
AST: 41 IU/L — ABNORMAL HIGH (ref 0–40)
Albumin/Globulin Ratio: 1.9 (ref 1.2–2.2)
Albumin: 5 g/dL (ref 4.3–5.2)
Alkaline Phosphatase: 74 IU/L (ref 44–121)
BUN/Creatinine Ratio: 18 (ref 9–20)
BUN: 17 mg/dL (ref 6–20)
Bilirubin Total: <0.2 mg/dL (ref 0.0–1.2)
CO2: 19 mmol/L — ABNORMAL LOW (ref 20–29)
Calcium: 9.5 mg/dL (ref 8.7–10.2)
Chloride: 106 mmol/L (ref 96–106)
Creatinine, Ser: 0.96 mg/dL (ref 0.76–1.27)
Globulin, Total: 2.6 g/dL (ref 1.5–4.5)
Glucose: 90 mg/dL (ref 70–99)
Potassium: 4.5 mmol/L (ref 3.5–5.2)
Sodium: 143 mmol/L (ref 134–144)
Total Protein: 7.6 g/dL (ref 6.0–8.5)
eGFR: 109 mL/min/{1.73_m2} (ref >59)

## 2023-02-12 LAB — HIV ANTIBODY (ROUTINE TESTING W REFLEX): HIV Screen 4th Generation wRfx: NONREACTIVE

## 2023-02-12 LAB — LIPID PANEL
Chol/HDL Ratio: 6.2 ratio — ABNORMAL HIGH (ref 0.0–5.0)
Cholesterol, Total: 261 mg/dL — ABNORMAL HIGH (ref 100–199)
HDL: 42 mg/dL (ref >39)
LDL Chol Calc (NIH): 164 mg/dL — ABNORMAL HIGH (ref 0–99)
Triglycerides: 291 mg/dL — ABNORMAL HIGH (ref 0–149)
VLDL Cholesterol Cal: 55 mg/dL — ABNORMAL HIGH (ref 5–40)

## 2023-02-14 ENCOUNTER — Encounter: Payer: Self-pay | Admitting: Family Medicine

## 2023-02-14 LAB — URINE CYTOLOGY ANCILLARY ONLY
Chlamydia: NEGATIVE
Comment: NEGATIVE
Comment: NORMAL
Neisseria Gonorrhea: NEGATIVE

## 2023-02-14 NOTE — Progress Notes (Signed)
Established Patient Office Visit  Subjective    Patient ID: Victor Burns, male    DOB: [birth year]  Age: 32 y.o. MRN: [id number]  CC:  Chief Complaint  Patient presents with   Annual Exam    HPI Victor Burns presents for routine annual exam. Patient also indicates that he would like to be on PREP. His fiance is male.     Outpatient Encounter Medications as of 02/11/2023  Medication Sig   albuterol (PROVENTIL) (2.5 MG/3ML) 0.083% nebulizer solution Take 3 mLs (2.5 mg total) by nebulization every 4 (four) hours as needed for wheezing or shortness of breath.   budesonide-formoterol (SYMBICORT) 160-4.5 MCG/ACT inhaler Inhale 2 puffs into the lungs in the morning and at bedtime.   predniSONE (DELTASONE) 50 MG tablet One tablet a day   [DISCONTINUED] albuterol (VENTOLIN HFA) 108 (90 Base) MCG/ACT inhaler Inhale 2 puffs into the lungs every 6 (six) hours as needed for wheezing or shortness of breath.   acetaminophen (TYLENOL) 500 MG tablet Take 500 mg by mouth every 6 (six) hours as needed. (Patient not taking: Reported on 02/11/2023)   albuterol (VENTOLIN HFA) 108 (90 Base) MCG/ACT inhaler Inhale 2 puffs into the lungs every 6 (six) hours as needed for wheezing or shortness of breath.   atorvastatin (LIPITOR) 20 MG tablet Take 1 tablet (20 mg total) by mouth daily.   dicyclomine (BENTYL) 20 MG tablet Take 1 tablet (20 mg total) by mouth every 6 (six) hours as needed for spasms. (Patient not taking: Reported on 02/11/2023)   ibuprofen (ADVIL) 800 MG tablet Take 1 tablet (800 mg total) by mouth 3 (three) times daily.   omeprazole (PRILOSEC) 20 MG capsule Take 1 capsule (20 mg total) by mouth daily. (Patient not taking: Reported on 09/29/2022)   No facility-administered encounter medications on file as of 02/11/2023.    Past Medical History:  Diagnosis Date   Abscess    Anxiety    Arthritis    knee, back   Asthma    Elevated cholesterol    Kidney stones     Past Surgical History:   Procedure Laterality Date   EVALUATION UNDER ANESTHESIA WITH HEMORRHOIDECTOMY N/A 08/08/2019   Procedure: EXAM UNDER ANESTHESIA WITH IRRIGATION AND DRAINAGE OF PERIRECTAL ABSCESS;  Surgeon: Ileana Roup, MD;  Location: [facility];  Service: General;  Laterality: N/A;   FISTULOTOMY N/A 02/20/2020   Procedure: PARTIAL  FISTULOTOMY WITH PLACEMENT OF CUTTING TETON;  Surgeon: Ileana Roup, MD;  Location: [facility];  Service: General;  Laterality: N/A;   INCISION AND DRAINAGE PERIRECTAL ABSCESS N/A 06/16/2018   Procedure: Jasmine December UNDER ANESTHESIA IRRIGATION AND DEBRIDEMENT PERIRECTAL ABSCESS;  Surgeon: Rolm Bookbinder, MD;  Location: [facility];  Service: General;  Laterality: N/A;   KNEE ARTHROSCOPY     PLACEMENT OF SETON  08/08/2019   Procedure: PLACEMENT OF DRAINING SETON;  Surgeon: Ileana Roup, MD;  Location: [facility];  Service: General;;   RECTAL EXAM UNDER ANESTHESIA N/A 02/20/2020   Procedure: ANORECTAL EXAM UNDER ANESTHESIA;  Surgeon: Ileana Roup, MD;  Location: [facility];  Service: General;  Laterality: N/A;    Family History  Problem Relation Age of Onset   Hypertension Mother    Hypertension Father     Social History   Socioeconomic History   Marital status: Single    Spouse name: Not on file   Number of children: Not on file   Years of education: Not on file   Highest education level: Not on  file  Occupational History   Occupation: Geophysical data processor  Tobacco Use   Smoking status: Some Days    Packs/day: 0.25    Years: 10.00    Total pack years: 2.50    Types: Cigarettes    Passive exposure: Current   Smokeless tobacco: Never   Tobacco comments:    Smokes on weekends   Vaping Use   Vaping Use: Never used  Substance and Sexual Activity   Alcohol use: Yes    Alcohol/week: 10.0 standard drinks of alcohol    Types: 10 Cans of beer per week    Comment: 2 bottles liquor/ per wk   Drug use: Yes    Frequency: 3.0 times per week    Types: Marijuana    Comment:  Pt reports smoking every other day (2g)   Sexual activity: Not on file  Other Topics Concern   Not on file  Social History Narrative   ** Merged History Encounter **       Social Determinants of Health   Financial Resource Strain: Not on file  Food Insecurity: No Food Insecurity (02/11/2023)   Hunger Vital Sign    Worried About Running Out of Food in the Last Year: Never true    Ran Out of Food in the Last Year: Never true  Transportation Needs: Not on file  Physical Activity: Not on file  Stress: Not on file  Social Connections: Not on file  Intimate Partner Violence: Not At Risk (02/11/2023)   Humiliation, Afraid, Rape, and Kick questionnaire    Fear of Current or Ex-Partner: No    Emotionally Abused: No    Physically Abused: No    Sexually Abused: No    Review of Systems  All other systems reviewed and are negative.       Objective    BP 136/83   Pulse 78   Temp 98.1 F (36.7 C) (Oral)   Resp 16   Ht '5\' 11"'$  (1.803 m)   Wt 260 lb (117.9 kg)   SpO2 96%   BMI 36.26 kg/m   Physical Exam Vitals and nursing note reviewed.  Constitutional:      General: He is not in acute distress. HENT:     Head: Normocephalic and atraumatic.     Right Ear: Tympanic membrane, ear canal and external ear normal.     Left Ear: Tympanic membrane, ear canal and external ear normal.     Nose: Nose normal.     Mouth/Throat:     Mouth: Mucous membranes are moist.     Pharynx: Oropharynx is clear.  Eyes:     Conjunctiva/sclera: Conjunctivae normal.     Pupils: Pupils are equal, round, and reactive to light.  Neck:     Thyroid: No thyromegaly.  Cardiovascular:     Rate and Rhythm: Normal rate and regular rhythm.     Heart sounds: Normal heart sounds. No murmur heard. Pulmonary:     Effort: Pulmonary effort is normal.     Breath sounds: Normal breath sounds.  Abdominal:     General: There is no distension.     Palpations: Abdomen is soft. There is no mass.     Tenderness: There  is no abdominal tenderness.     Hernia: There is no hernia in the left inguinal area or right inguinal area.  Genitourinary:    Penis: Normal and circumcised.      Testes: Normal.  Musculoskeletal:        General: Normal range  of motion.     Cervical back: Normal range of motion and neck supple.     Right lower leg: No edema.     Left lower leg: No edema.  Skin:    General: Skin is warm and dry.  Neurological:     General: No focal deficit present.     Mental Status: He is alert and oriented to person, place, and time. Mental status is at baseline.  Psychiatric:        Mood and Affect: Mood normal.        Behavior: Behavior normal.         Assessment & Plan:   1. Annual physical exam  - CMP14+EGFR  2. Screening for deficiency anemia  - CBC with Differential  3. Screening for lipid disorders  - Lipid Panel  4. Screening for HIV (human immunodeficiency virus)  - HIV antibody (with reflex)  5. Screening for STD (sexually transmitted disease)  - Urine cytology ancillary only  Return in about 3 months (around 05/14/2023) for follow up.   Becky Sax, MD

## 2023-02-17 ENCOUNTER — Telehealth: Payer: Self-pay | Admitting: *Deleted

## 2023-02-17 NOTE — Telephone Encounter (Signed)
Message has been sent to provider for further instruction.       11Copied from [place] [id number]. Topic: General - Other >> Feb 16, 2023  2:42 PM Victor Burns wrote: Reason for CRM: Pt is calling to speak with his PCP regarding his medication: atorvastatin (LIPITOR) 20 MG tablet.  Pt states that he has some questions regarding the medication. Please have PCP call pt back.

## 2023-02-21 ENCOUNTER — Encounter: Payer: Self-pay | Admitting: Family Medicine

## 2023-02-22 ENCOUNTER — Other Ambulatory Visit: Payer: Self-pay | Admitting: Family Medicine

## 2023-02-22 DIAGNOSIS — E785 Hyperlipidemia, unspecified: Secondary | ICD-10-CM

## 2023-02-22 MED ORDER — ATORVASTATIN CALCIUM 20 MG PO TABS
20.0000 mg | ORAL_TABLET | Freq: Every day | ORAL | 2 refills | Status: DC
Start: 2023-02-22 — End: 2023-05-26

## 2023-03-29 ENCOUNTER — Ambulatory Visit: Payer: Self-pay

## 2023-03-29 ENCOUNTER — Encounter: Payer: Self-pay | Admitting: Family

## 2023-03-29 ENCOUNTER — Ambulatory Visit (INDEPENDENT_AMBULATORY_CARE_PROVIDER_SITE_OTHER): Payer: BC Managed Care – PPO | Admitting: Family Medicine

## 2023-03-29 VITALS — BP 134/88 | HR 62 | Temp 98.0°F | Resp 16 | Ht 71.0 in | Wt 259.0 lb

## 2023-03-29 DIAGNOSIS — R0789 Other chest pain: Secondary | ICD-10-CM

## 2023-03-29 MED ORDER — TRIAMCINOLONE ACETONIDE 40 MG/ML IJ SUSP
40.0000 mg | Freq: Once | INTRAMUSCULAR | Status: AC
  Administered 2023-03-29: 40 mg via INTRAMUSCULAR

## 2023-03-29 NOTE — Progress Notes (Unsigned)
Patient ID: Victor Burns, male    DOB: [birth year]  MRN: [id number]  CC: No chief complaint on file.   Subjective: Victor Burns is a 32 y.o. male who presents for  His concerns today include:   Referred to Cards and No Show    03/29/2023 per triage RN call note: Chief Complaint: chest pain Symptoms: chest pain in L top chest, goes into L armpit, started out shooting pains and now just sore to touch and when moving, pain 5-6/10 Frequency: 3 days Pertinent Negatives: Patient denies dizziness, HA, nausea Disposition: [] ED /[] Urgent Care (no appt availability in office) / [x] Appointment(In office/virtual)/ []  [facility]/ [] Home Care/ [] Refused Recommended Disposition /[] [facility]/ []  Follow-up with PCP Additional Notes: pt unsure that chest pain r/t to but wanting to be checked out, scheduled OV today at 1520 with Kaleeah Gingerich, NP   Reason for Disposition  [1] Chest pain lasts > 5 minutes AND [2] occurred > 3 days ago (72 hours) AND [3] NO chest pain or cardiac symptoms now  Answer Assessment - Initial Assessment Questions 1. LOCATION: "Where does it hurt?"       Chest L top area 2. RADIATION: "Does the pain go anywhere else?" (e.g., into neck, jaw, arms, back)     Goes into L armpit  3. ONSET: "When did the chest pain begin?" (Minutes, hours or days)      3 days  4. PATTERN: "Does the pain come and go, or has it been constant since it started?"  "Does it get worse with exertion?"      Constant  6. SEVERITY: "How bad is the pain?"  (e.g., Scale 1-10; mild, moderate, or severe)    - MILD (1-3): doesn't interfere with normal activities     - MODERATE (4-7): interferes with normal activities or awakens from sleep    - SEVERE (8-10): excruciating pain, unable to do any normal activities       5-6 7. CARDIAC RISK FACTORS: "Do you have any history of heart problems or risk factors for heart disease?" (e.g., angina, prior heart attack; diabetes, high blood pressure,  high cholesterol, smoker, or strong family history of heart disease)     HTN, HDL, smoker  10. OTHER SYMPTOMS: "Do you have any other symptoms?" (e.g., dizziness, nausea, vomiting, sweating, fever, difficulty breathing, cough)       no  Protocols used: Chest Pain-A-AH  Today's visit 03/29/2023:   Patient Active Problem List   Diagnosis Date Noted   Prediabetes 06/03/2022   Hyperlipidemia 06/03/2022   GERD (gastroesophageal reflux disease) 11/24/2021   Perirectal abscess s/p I&D 06/16/2018 06/16/2018   Alcohol abuse 06/16/2018   Mild persistent asthma 10/07/2017     Current Outpatient Medications on File Prior to Visit  Medication Sig Dispense Refill   acetaminophen (TYLENOL) 500 MG tablet Take 500 mg by mouth every 6 (six) hours as needed. (Patient not taking: Reported on 02/11/2023)     albuterol (PROVENTIL) (2.5 MG/3ML) 0.083% nebulizer solution Take 3 mLs (2.5 mg total) by nebulization every 4 (four) hours as needed for wheezing or shortness of breath. 75 mL 2   albuterol (VENTOLIN HFA) 108 (90 Base) MCG/ACT inhaler Inhale 2 puffs into the lungs every 6 (six) hours as needed for wheezing or shortness of breath. 8 g 2   atorvastatin (LIPITOR) 20 MG tablet Take 1 tablet (20 mg total) by mouth daily. 30 tablet 2   budesonide-formoterol (SYMBICORT) 160-4.5 MCG/ACT inhaler Inhale 2 puffs into  the lungs in the morning and at bedtime. 10.2 g 5   dicyclomine (BENTYL) 20 MG tablet Take 1 tablet (20 mg total) by mouth every 6 (six) hours as needed for spasms. (Patient not taking: Reported on 02/11/2023) 30 tablet 1   ibuprofen (ADVIL) 800 MG tablet Take 1 tablet (800 mg total) by mouth 3 (three) times daily. 21 tablet 0   omeprazole (PRILOSEC) 20 MG capsule Take 1 capsule (20 mg total) by mouth daily. (Patient not taking: Reported on 09/29/2022) 90 capsule 1   predniSONE (DELTASONE) 50 MG tablet One tablet a day 6 tablet 0   No current facility-administered medications on file prior to visit.     Allergies  Allergen Reactions   Bee Venom Anaphylaxis   Shellfish Allergy Anaphylaxis    Pt says not allergic anymore   Compazine [Prochlorperazine] Other (See Comments)    Trembling, feeling hot.    Other Itching    CHG wipes    Social History   Socioeconomic History   Marital status: Single    Spouse name: Not on file   Number of children: Not on file   Years of education: Not on file   Highest education level: Not on file  Occupational History   Occupation: Research scientist (medical) [employer]  Tobacco Use   Smoking status: Some Days    Packs/day: 0.25    Years: 10.00    Additional pack years: 0.00    Total pack years: 2.50    Types: Cigarettes    Passive exposure: Current   Smokeless tobacco: Never   Tobacco comments:    Smokes on weekends   Vaping Use   Vaping Use: Never used  Substance and Sexual Activity   Alcohol use: Yes    Alcohol/week: 10.0 standard drinks of alcohol    Types: 10 Cans of beer per week    Comment: 2 bottles liquor/ per wk   Drug use: Yes    Frequency: 3.0 times per week    Types: Marijuana    Comment: Pt reports smoking every other day (2g)   Sexual activity: Not on file  Other Topics Concern   Not on file  Social History Narrative   ** Merged History Encounter **       Social Determinants of Health   Financial Resource Strain: Not on file  Food Insecurity: No Food Insecurity (02/11/2023)   Hunger Vital Sign    Worried About Running Out of Food in the Last Year: Never true    Ran Out of Food in the Last Year: Never true  Transportation Needs: Not on file  Physical Activity: Not on file  Stress: Not on file  Social Connections: Not on file  Intimate Partner Violence: Not At Risk (02/11/2023)   Humiliation, Afraid, Rape, and Kick questionnaire    Fear of Current or Ex-Partner: No    Emotionally Abused: No    Physically Abused: No    Sexually Abused: No    Family History  Problem Relation Age of Onset   Hypertension Mother    Hypertension  Father     Past Surgical History:  Procedure Laterality Date   EVALUATION UNDER ANESTHESIA WITH HEMORRHOIDECTOMY N/A 08/08/2019   Procedure: EXAM UNDER ANESTHESIA WITH IRRIGATION AND DRAINAGE OF PERIRECTAL ABSCESS;  Surgeon: Andria Meuse, MD;  Location: [facility];  Service: General;  Laterality: N/A;   FISTULOTOMY N/A 02/20/2020   Procedure: PARTIAL  FISTULOTOMY WITH PLACEMENT OF CUTTING TETON;  Surgeon: Andria Meuse, MD;  Location: [facility];  Service: General;  Laterality: N/A;   INCISION AND DRAINAGE PERIRECTAL ABSCESS N/A 06/16/2018   Procedure: EXAM UNDER ANESTHESIA IRRIGATION AND DEBRIDEMENT PERIRECTAL ABSCESS;  Surgeon: Emelia Loron, MD;  Location: [facility];  Service: General;  Laterality: N/A;   KNEE ARTHROSCOPY     PLACEMENT OF SETON  08/08/2019   Procedure: PLACEMENT OF DRAINING SETON;  Surgeon: Andria Meuse, MD;  Location: [facility];  Service: General;;   RECTAL EXAM UNDER ANESTHESIA N/A 02/20/2020   Procedure: ANORECTAL EXAM UNDER ANESTHESIA;  Surgeon: Andria Meuse, MD;  Location: [facility];  Service: General;  Laterality: N/A;    ROS: Review of Systems Negative except as stated above  PHYSICAL EXAM: There were no vitals taken for this visit.  Physical Exam  {male adult master:310786} {male adult master:310785}     Latest Ref Rng & Units 02/11/2023   11:14 AM 06/15/2022    4:19 PM 06/02/2022    9:56 AM  CMP  Glucose 70 - 99 mg/dL 90  161    BUN 6 - 20 mg/dL 17  15    Creatinine 0.96 - 1.27 mg/dL 0.45  4.09    Sodium 811 - 144 mmol/L 143  138    Potassium 3.5 - 5.2 mmol/L 4.5  3.7    Chloride 96 - 106 mmol/L 106  108    CO2 20 - 29 mmol/L 19  24    Calcium 8.7 - 10.2 mg/dL 9.5  9.6    Total Protein 6.0 - 8.5 g/dL 7.6   7.5   Total Bilirubin 0.0 - 1.2 mg/dL <9.1   <4.7   Alkaline Phos 44 - 121 IU/L 74   73   AST 0 - 40 IU/L 41   24   ALT 0 - 44 IU/L 49   27    Lipid Panel     Component Value Date/Time   CHOL 261 (H) 02/11/2023 1114   TRIG  291 (H) 02/11/2023 1114   HDL 42 02/11/2023 1114   CHOLHDL 6.2 (H) 02/11/2023 1114   LDLCALC 164 (H) 02/11/2023 1114    CBC    Component Value Date/Time   WBC 4.0 02/11/2023 1114   WBC 3.5 (L) 06/15/2022 1619   RBC 4.57 02/11/2023 1114   RBC 4.39 06/15/2022 1619   HGB 13.2 02/11/2023 1114   HCT 41.2 02/11/2023 1114   PLT 323 02/11/2023 1114   MCV 90 02/11/2023 1114   MCH 28.9 02/11/2023 1114   MCH 29.8 06/15/2022 1619   MCHC 32.0 02/11/2023 1114   MCHC 32.8 06/15/2022 1619   RDW 13.6 02/11/2023 1114   LYMPHSABS 1.7 02/11/2023 1114   MONOABS 0.3 05/04/2022 2301   EOSABS 0.0 02/11/2023 1114   BASOSABS 0.0 02/11/2023 1114    ASSESSMENT AND PLAN:  There are no diagnoses linked to this encounter.   Patient was given the opportunity to ask questions.  Patient verbalized understanding of the plan and was able to repeat key elements of the plan. Patient was given clear instructions to go to Emergency Department or return to medical center if symptoms don't improve, worsen, or new problems develop.The patient verbalized understanding.   No orders of the defined types were placed in this encounter.    Requested Prescriptions    No prescriptions requested or ordered in this encounter    No follow-ups on file.  Rema Fendt, NP

## 2023-03-29 NOTE — Telephone Encounter (Signed)
  Chief Complaint: chest pain Symptoms: chest pain in L top chest, goes into L armpit, started out shooting pains and now just sore to touch and when moving, pain 5-6/10 Frequency: 3 days Pertinent Negatives: Patient denies dizziness, HA, nausea Disposition: ED /[] Urgent Care (no appt availability in office) / Appointment(In office/virtual)/  [facility]/ Home Care/ Refused Recommended Disposition /[] [facility]/  Follow-up with PCP Additional Notes: pt unsure that chest pain r/t to but wanting to be checked out, scheduled OV today at 1520 with Amy, NP  Reason for Disposition  [1] Chest pain lasts > 5 minutes AND [2] occurred > 3 days ago (72 hours) AND [3] NO chest pain or cardiac symptoms now  Answer Assessment - Initial Assessment Questions 1. LOCATION: "Where does it hurt?"       Chest L top area 2. RADIATION: "Does the pain go anywhere else?" (e.g., into neck, jaw, arms, back)     Goes into L armpit  3. ONSET: "When did the chest pain begin?" (Minutes, hours or days)      3 days  4. PATTERN: "Does the pain come and go, or has it been constant since it started?"  "Does it get worse with exertion?"      Constant  6. SEVERITY: "How bad is the pain?"  (e.g., Scale 1-10; mild, moderate, or severe)    - MILD (1-3): doesn't interfere with normal activities     - MODERATE (4-7): interferes with normal activities or awakens from sleep    - SEVERE (8-10): excruciating pain, unable to do any normal activities       5-6 7. CARDIAC RISK FACTORS: "Do you have any history of heart problems or risk factors for heart disease?" (e.g., angina, prior heart attack; diabetes, high blood pressure, high cholesterol, smoker, or strong family history of heart disease)     HTN, HDL, smoker  10. OTHER SYMPTOMS: "Do you have any other symptoms?" (e.g., dizziness, nausea, vomiting, sweating, fever, difficulty breathing, cough)       no  Protocols used: Chest Pain-A-AH

## 2023-03-29 NOTE — Progress Notes (Signed)
Chest discomfort x 3 days  Pain flucuates 5/5, constant but worse when he touches left upper chest, by clavicle area.  Cough uses MDI and nebulizer

## 2023-04-01 ENCOUNTER — Ambulatory Visit
Admission: EM | Admit: 2023-04-01 | Discharge: 2023-04-01 | Disposition: A | Discharge status: Home / Self Care | Payer: BC Managed Care – PPO | Attending: Family Medicine | Admitting: Family Medicine

## 2023-04-01 DIAGNOSIS — R300 Dysuria: Secondary | ICD-10-CM | POA: Diagnosis not present

## 2023-04-01 DIAGNOSIS — N410 Acute prostatitis: Secondary | ICD-10-CM

## 2023-04-01 LAB — POCT URINALYSIS DIP (MANUAL ENTRY)
Bilirubin, UA: NEGATIVE
Blood, UA: NEGATIVE
Glucose, UA: NEGATIVE mg/dL
Ketones, POC UA: NEGATIVE mg/dL
Leukocytes, UA: NEGATIVE
Nitrite, UA: NEGATIVE
Spec Grav, UA: >=1.03 — AB (ref 1.010–1.025)
Urobilinogen, UA: 0.2 E.U./dL
pH, UA: 6 (ref 5.0–8.0)

## 2023-04-01 MED ORDER — CIPROFLOXACIN HCL 500 MG PO TABS: 500.0000 mg | ORAL_TABLET | Freq: Two times a day (BID) | ORAL | 0 refills | Status: AC

## 2023-04-01 NOTE — ED Provider Notes (Signed)
[facility]    CSN: [id number] Arrival date & time: 04/01/23  0820      History   Chief Complaint Chief Complaint  Patient presents with   Urinary Tract Infection    HPI Victor Burns is a 32 y.o. male.    Urinary Tract Infection  Here for dysuria and urinary frequency and incomplete bladder emptying.  Symptoms began about 3 days ago.  He will have burning right before he starts urinating and then at the end of the urinary stream, burning.  Then he will feel like he has to go right back and he is having some pelvic pressure.  No penile discharge and no itching.  He has had a possible UTI previously  No fever or chills or nausea or vomiting.  He has had a little bit of low back pain.  Past Medical History:  Diagnosis Date   Abscess    Anxiety    Arthritis    knee, back   Asthma    Elevated cholesterol    Kidney stones     Patient Active Problem List   Diagnosis Date Noted   Prediabetes 06/03/2022   Hyperlipidemia 06/03/2022   GERD (gastroesophageal reflux disease) 11/24/2021   Perirectal abscess s/p I&D 06/16/2018 06/16/2018   Alcohol abuse 06/16/2018   Mild persistent asthma 10/07/2017    Past Surgical History:  Procedure Laterality Date   EVALUATION UNDER ANESTHESIA WITH HEMORRHOIDECTOMY N/A 08/08/2019   Procedure: EXAM UNDER ANESTHESIA WITH IRRIGATION AND DRAINAGE OF PERIRECTAL ABSCESS;  Surgeon: Andria Meuse, MD;  Location: [facility];  Service: General;  Laterality: N/A;   FISTULOTOMY N/A 02/20/2020   Procedure: PARTIAL  FISTULOTOMY WITH PLACEMENT OF CUTTING TETON;  Surgeon: Andria Meuse, MD;  Location: [facility];  Service: General;  Laterality: N/A;   INCISION AND DRAINAGE PERIRECTAL ABSCESS N/A 06/16/2018   Procedure: Francia Greaves UNDER ANESTHESIA IRRIGATION AND DEBRIDEMENT PERIRECTAL ABSCESS;  Surgeon: Emelia Loron, MD;  Location: [facility];  Service: General;  Laterality: N/A;   KNEE ARTHROSCOPY     PLACEMENT OF SETON  08/08/2019    Procedure: PLACEMENT OF DRAINING SETON;  Surgeon: Andria Meuse, MD;  Location: [facility];  Service: General;;   RECTAL EXAM UNDER ANESTHESIA N/A 02/20/2020   Procedure: ANORECTAL EXAM UNDER ANESTHESIA;  Surgeon: Andria Meuse, MD;  Location: [facility];  Service: General;  Laterality: N/A;       Home Medications    Prior to Admission medications   Medication Sig Start Date End Date Taking? Authorizing Provider  ciprofloxacin (CIPRO) 500 MG tablet Take 1 tablet (500 mg total) by mouth 2 (two) times daily for 7 days. 04/01/23 04/08/23 Yes Mynor Witkop, Janace Aris, MD  acetaminophen (TYLENOL) 500 MG tablet Take 500 mg by mouth every 6 (six) hours as needed.    [provider]  albuterol (PROVENTIL) (2.5 MG/3ML) 0.083% nebulizer solution Take 3 mLs (2.5 mg total) by nebulization every 4 (four) hours as needed for wheezing or shortness of breath. 11/11/22 11/11/23  Elson Areas, PA-C  albuterol (VENTOLIN HFA) 108 (90 Base) MCG/ACT inhaler Inhale 2 puffs into the lungs every 6 (six) hours as needed for wheezing or shortness of breath. 02/11/23   Georganna Skeans, MD  atorvastatin (LIPITOR) 20 MG tablet Take 1 tablet (20 mg total) by mouth daily. 02/22/23 05/23/23  Georganna Skeans, MD  budesonide-formoterol [facility]) 160-4.5 MCG/ACT inhaler Inhale 2 puffs into the lungs in the morning and at bedtime. 12/18/21   Chilton Greathouse, MD  Omega-3  Fatty Acids (FISH OIL) 500 MG CAPS Take 500 mg by mouth daily.    [provider]  omeprazole (PRILOSEC) 20 MG capsule Take 1 capsule (20 mg total) by mouth daily. Patient not taking: Reported on 09/29/2022 06/02/22 11/29/22  Rema Fendt, NP    Family History Family History  Problem Relation Age of Onset   Hypertension Mother    Hypertension Father     Social History Social History   Tobacco Use   Smoking status: Some Days    Packs/day: 0.25    Years: 10.00    Additional pack years: 0.00    Total pack years: 2.50    Types: Cigarettes     Passive exposure: Current   Smokeless tobacco: Never   Tobacco comments:    Smokes on weekends   Vaping Use   Vaping Use: Never used  Substance Use Topics   Alcohol use: Yes    Alcohol/week: 10.0 standard drinks of alcohol    Types: 10 Cans of beer per week    Comment: 2 bottles liquor/ per wk   Drug use: Yes    Frequency: 3.0 times per week    Types: Marijuana    Comment: Pt reports smoking every other day (2g)     Allergies   Bee venom, Shellfish allergy, Compazine [prochlorperazine], and Other   Review of Systems Review of Systems   Physical Exam Triage Vital Signs ED Triage Vitals  Enc Vitals Group     BP 04/01/23 0831 138/87     Pulse Rate 04/01/23 0831 (!) 52     Resp 04/01/23 0831 18     Temp 04/01/23 0831 97.9 F (36.6 C)     Temp Source 04/01/23 0831 Oral     SpO2 04/01/23 0831 93 %     Weight --      Height --      Head Circumference --      Peak Flow --      Pain Score 04/01/23 0832 0     Pain Loc --      Pain Edu? --      Excl. in GC? --    No data found.  Updated Vital Signs BP 138/87 (BP Location: Left Arm)   Pulse (!) 52   Temp 97.9 F (36.6 C) (Oral)   Resp 18   SpO2 93%   Visual Acuity Right Eye Distance:   Left Eye Distance:   Bilateral Distance:    Right Eye Near:   Left Eye Near:    Bilateral Near:     Physical Exam Vitals reviewed.  Constitutional:      General: He is not in acute distress.    Appearance: He is not ill-appearing, toxic-appearing or diaphoretic.  HENT:     Mouth/Throat:     Mouth: Mucous membranes are moist.  Eyes:     Extraocular Movements: Extraocular movements intact.     Conjunctiva/sclera: Conjunctivae normal.     Pupils: Pupils are equal, round, and reactive to light.  Cardiovascular:     Rate and Rhythm: Normal rate and regular rhythm.     Heart sounds: No murmur heard. Pulmonary:     Effort: Pulmonary effort is normal.     Breath sounds: Normal breath sounds.  Abdominal:     General:  There is no distension.     Palpations: Abdomen is soft.     Tenderness: There is no abdominal tenderness. There is no guarding.  Musculoskeletal:  Cervical back: Neck supple.  Lymphadenopathy:     Cervical: No cervical adenopathy.  Skin:    Coloration: Skin is not jaundiced.  Neurological:     General: No focal deficit present.     Mental Status: He is alert and oriented to person, place, and time.  Psychiatric:        Behavior: Behavior normal.      UC Treatments / Results  Labs (all labs ordered are listed, but only abnormal results are displayed) Labs Reviewed  POCT URINALYSIS DIP (MANUAL ENTRY) - Abnormal; Notable for the following components:      Result Value   Color, UA yellow (*)    Spec Grav, UA >=1.030 (*)    Protein Ur, POC trace (*)    All other components within normal limits  URINE CULTURE  CYTOLOGY, (ORAL, ANAL, URETHRAL) ANCILLARY ONLY    EKG   Radiology No results found.  Procedures Procedures (including critical care time)  Medications Ordered in UC Medications - No data to display  Initial Impression / Assessment and Plan / UC Course  I have reviewed the triage vital signs and the nursing notes.  Pertinent labs & imaging results that were available during my care of the patient were reviewed by me and considered in my medical decision making (see chart for details).         Urinalysis shows some protein and it is concentrated with a specific gravity of 1.03, but there are no leukocytes or red blood cells.  I am going to go ahead and treat for UTI and prostatitis with some Cipro.  Urethral self swab is done, and staff will notify him and treat per protocol any positives.  Final Clinical Impressions(s) / UC Diagnoses   Final diagnoses:  Acute prostatitis  Dysuria     Discharge Instructions      The urinalysis did not have any white blood cells or red blood cells.  Your symptoms still might indicate a prostate infection, and a  lot of times the urine is normal.  Urine culture is also sent.  Take Cipro 500 mg--1 tablet 2 times daily for 7 days  Staff will notify you if anything is positive on the swab      ED Prescriptions     Medication Sig Dispense Auth. Provider   ciprofloxacin (CIPRO) 500 MG tablet Take 1 tablet (500 mg total) by mouth 2 (two) times daily for 7 days. 14 tablet Zoejane Gaulin, Janace Aris, MD      PDMP not reviewed this encounter.   Zenia Resides, MD 04/01/23 [phone]

## 2023-04-01 NOTE — ED Triage Notes (Signed)
Pt c/o pain prior and after urinating with lower abdominal pressure for 2-3 days. Denies concerns for STD's.

## 2023-04-01 NOTE — Discharge Instructions (Signed)
The urinalysis did not have any white blood cells or red blood cells.  Your symptoms still might indicate a prostate infection, and a lot of times the urine is normal.  Urine culture is also sent.  Take Cipro 500 mg--1 tablet 2 times daily for 7 days  Staff will notify you if anything is positive on the swab

## 2023-04-02 LAB — URINE CULTURE: Culture: NO GROWTH

## 2023-04-04 ENCOUNTER — Emergency Department (HOSPITAL_COMMUNITY): Payer: BC Managed Care – PPO

## 2023-04-04 ENCOUNTER — Encounter (HOSPITAL_COMMUNITY): Payer: Self-pay | Admitting: Emergency Medicine

## 2023-04-04 ENCOUNTER — Emergency Department (HOSPITAL_COMMUNITY)
Admission: EM | Admit: 2023-04-04 | Discharge: 2023-04-04 | Disposition: A | Discharge status: Home / Self Care | Payer: BC Managed Care – PPO | Attending: Student | Admitting: Student

## 2023-04-04 DIAGNOSIS — F1410 Cocaine abuse, uncomplicated: Secondary | ICD-10-CM

## 2023-04-04 DIAGNOSIS — F17210 Nicotine dependence, cigarettes, uncomplicated: Secondary | ICD-10-CM | POA: Diagnosis not present

## 2023-04-04 DIAGNOSIS — R42 Dizziness and giddiness: Secondary | ICD-10-CM | POA: Diagnosis not present

## 2023-04-04 DIAGNOSIS — Z79899 Other long term (current) drug therapy: Secondary | ICD-10-CM | POA: Insufficient documentation

## 2023-04-04 DIAGNOSIS — E162 Hypoglycemia, unspecified: Secondary | ICD-10-CM

## 2023-04-04 DIAGNOSIS — J45909 Unspecified asthma, uncomplicated: Secondary | ICD-10-CM | POA: Diagnosis not present

## 2023-04-04 DIAGNOSIS — R200 Anesthesia of skin: Secondary | ICD-10-CM

## 2023-04-04 DIAGNOSIS — R29818 Other symptoms and signs involving the nervous system: Secondary | ICD-10-CM | POA: Diagnosis not present

## 2023-04-04 DIAGNOSIS — I6622 Occlusion and stenosis of left posterior cerebral artery: Secondary | ICD-10-CM | POA: Diagnosis not present

## 2023-04-04 DIAGNOSIS — R202 Paresthesia of skin: Secondary | ICD-10-CM

## 2023-04-04 DIAGNOSIS — R231 Pallor: Secondary | ICD-10-CM | POA: Diagnosis not present

## 2023-04-04 DIAGNOSIS — E161 Other hypoglycemia: Secondary | ICD-10-CM | POA: Diagnosis not present

## 2023-04-04 DIAGNOSIS — I10 Essential (primary) hypertension: Secondary | ICD-10-CM | POA: Diagnosis not present

## 2023-04-04 LAB — I-STAT CHEM 8, ED
BUN: 17 mg/dL (ref 6–20)
Calcium, Ion: 1.19 mmol/L (ref 1.15–1.40)
Chloride: 107 mmol/L (ref 98–111)
Creatinine, Ser: 1 mg/dL (ref 0.61–1.24)
Glucose, Bld: 79 mg/dL (ref 70–99)
HCT: 39 % (ref 39.0–52.0)
Hemoglobin: 13.3 g/dL (ref 13.0–17.0)
Potassium: 3.9 mmol/L (ref 3.5–5.1)
Sodium: 140 mmol/L (ref 135–145)
TCO2: 22 mmol/L (ref 22–32)

## 2023-04-04 LAB — COMPREHENSIVE METABOLIC PANEL
ALT: 47 U/L — ABNORMAL HIGH (ref 0–44)
AST: 91 U/L — ABNORMAL HIGH (ref 15–41)
Albumin: 4.1 g/dL (ref 3.5–5.0)
Alkaline Phosphatase: 60 U/L (ref 38–126)
Anion gap: 12 (ref 5–15)
BUN: 17 mg/dL (ref 6–20)
CO2: 22 mmol/L (ref 22–32)
Calcium: 9.1 mg/dL (ref 8.9–10.3)
Chloride: 104 mmol/L (ref 98–111)
Creatinine, Ser: 1.01 mg/dL (ref 0.61–1.24)
GFR, Estimated: >60 mL/min (ref >60)
Glucose, Bld: 81 mg/dL (ref 70–99)
Potassium: 3.8 mmol/L (ref 3.5–5.1)
Sodium: 138 mmol/L (ref 135–145)
Total Bilirubin: 0.7 mg/dL (ref 0.3–1.2)
Total Protein: 7.3 g/dL (ref 6.5–8.1)

## 2023-04-04 LAB — CBC
HCT: 37.9 % — ABNORMAL LOW (ref 39.0–52.0)
Hemoglobin: 12.7 g/dL — ABNORMAL LOW (ref 13.0–17.0)
MCH: 29.7 pg (ref 26.0–34.0)
MCHC: 33.5 g/dL (ref 30.0–36.0)
MCV: 88.8 fL (ref 80.0–100.0)
Platelets: 299 10*3/uL (ref 150–400)
RBC: 4.27 MIL/uL (ref 4.22–5.81)
RDW: 13.5 % (ref 11.5–15.5)
WBC: 4.8 10*3/uL (ref 4.0–10.5)
nRBC: 0 % (ref 0.0–0.2)

## 2023-04-04 LAB — RAPID URINE DRUG SCREEN, HOSP PERFORMED
Amphetamines: NOT DETECTED
Barbiturates: NOT DETECTED
Benzodiazepines: NOT DETECTED
Cocaine: POSITIVE — AB
Opiates: NOT DETECTED
Tetrahydrocannabinol: POSITIVE — AB

## 2023-04-04 LAB — PROTIME-INR
INR: 1.1 (ref 0.8–1.2)
Prothrombin Time: 14.2 seconds (ref 11.4–15.2)

## 2023-04-04 LAB — CYTOLOGY, (ORAL, ANAL, URETHRAL) ANCILLARY ONLY
Chlamydia: NEGATIVE
Comment: NEGATIVE
Comment: NEGATIVE
Comment: NORMAL
Neisseria Gonorrhea: NEGATIVE
Trichomonas: NEGATIVE

## 2023-04-04 LAB — CBG MONITORING, ED
Glucose-Capillary: 192 mg/dL — ABNORMAL HIGH (ref 70–99)
Glucose-Capillary: 60 mg/dL — ABNORMAL LOW (ref 70–99)
Glucose-Capillary: 71 mg/dL (ref 70–99)

## 2023-04-04 LAB — URINALYSIS, ROUTINE W REFLEX MICROSCOPIC
Bilirubin Urine: NEGATIVE
Glucose, UA: 50 mg/dL — AB
Hgb urine dipstick: NEGATIVE
Ketones, ur: 20 mg/dL — AB
Leukocytes,Ua: NEGATIVE
Nitrite: NEGATIVE
Protein, ur: NEGATIVE mg/dL
Specific Gravity, Urine: >1.046 — ABNORMAL HIGH (ref 1.005–1.030)
pH: 5 (ref 5.0–8.0)

## 2023-04-04 LAB — DIFFERENTIAL
Abs Immature Granulocytes: 0 10*3/uL (ref 0.00–0.07)
Basophils Absolute: 0 10*3/uL (ref 0.0–0.1)
Basophils Relative: 0 %
Eosinophils Absolute: 0 10*3/uL (ref 0.0–0.5)
Eosinophils Relative: 0 %
Immature Granulocytes: 0 %
Lymphocytes Relative: 27 %
Lymphs Abs: 1.3 10*3/uL (ref 0.7–4.0)
Monocytes Absolute: 0.4 10*3/uL (ref 0.1–1.0)
Monocytes Relative: 8 %
Neutro Abs: 3 10*3/uL (ref 1.7–7.7)
Neutrophils Relative %: 65 %

## 2023-04-04 LAB — ETHANOL: Alcohol, Ethyl (B): <10 mg/dL (ref <10)

## 2023-04-04 LAB — APTT: aPTT: 30 seconds (ref 24–36)

## 2023-04-04 MED ORDER — METOCLOPRAMIDE HCL 5 MG/ML IJ SOLN
10.0000 mg | Freq: Once | INTRAMUSCULAR | Status: AC
  Administered 2023-04-04: 10 mg via INTRAVENOUS
  Filled 2023-04-04: qty 2

## 2023-04-04 MED ORDER — IOHEXOL 350 MG/ML SOLN
75.0000 mL | Freq: Once | INTRAVENOUS | Status: AC | PRN
  Administered 2023-04-04: 75 mL via INTRAVENOUS

## 2023-04-04 MED ORDER — SODIUM CHLORIDE 0.9 % IV BOLUS
1000.0000 mL | Freq: Once | INTRAVENOUS | Status: AC
  Administered 2023-04-04: 1000 mL via INTRAVENOUS

## 2023-04-04 MED ORDER — DIPHENHYDRAMINE HCL 50 MG/ML IJ SOLN
25.0000 mg | Freq: Once | INTRAMUSCULAR | Status: AC
  Administered 2023-04-04: 25 mg via INTRAVENOUS
  Filled 2023-04-04: qty 1

## 2023-04-04 MED ORDER — LORAZEPAM 2 MG/ML IJ SOLN
1.0000 mg | Freq: Once | INTRAMUSCULAR | Status: AC | PRN
  Administered 2023-04-04: 1 mg via INTRAVENOUS
  Filled 2023-04-04: qty 1

## 2023-04-04 NOTE — ED Provider Notes (Signed)
  Physical Exam  BP (!) 140/86 (BP Location: Right Arm)   Pulse (!) 58   Temp 98.3 F (36.8 C) (Oral)   Resp 16   SpO2 97%   Physical Exam  Procedures  Procedures  ED Course / MDM    Medical Decision Making Care assumed at 3 pm.  Patient did some cocaine and has some numbness.  Patient was seen by neurology Dr. Roda Shutters.  CTA showed severe left P2 stenosis.  Sign out pending MRI   8:14 PM MRI showed no stroke. Symptoms improved. UDS + cocaine. Glucose is normal. I messaged Dr. Roda Shutters, who agreed with discharge. Told him to avoid cocaine use.   Problems Addressed: Arm numbness left: acute illness or injury Cocaine abuse (HCC): acute illness or injury  Amount and/or Complexity of Data Reviewed Labs: ordered. Decision-making details documented in ED Course. Radiology: ordered and independent interpretation performed.  Risk Prescription drug management.          Charlynne Pander, MD 04/04/23 2016

## 2023-04-04 NOTE — ED Provider Triage Note (Signed)
Emergency Medicine Provider Triage Evaluation Note  Victor Burns , a 32 y.o. male  was evaluated in triage.  Pt complains of left head numbness, left arm and leg numbness as well as weakness.  Patient reports symptoms beginning around 10 AM insidiously.  States that he woke up around 920 and "symptoms began all of a sudden."  States that he has felt as if he was going to pass out with positional changes.  States that he is retained left upper and lower extremity symptoms even when lying flat and not feeling as if you are going to pass out.  Denies history of similar symptoms in the past.  States that he did use cocaine intranasally yesterday evening but denies any intravenous drug use.  Denies chest pain, shortness of breath, fever, cough, congestion, abdominal pain, nausea, vomiting.  Review of Systems  Positive: See above Negative:   Physical Exam  BP (!) 141/76   Pulse (!) 57   Temp 98.4 F (36.9 C) (Oral)   Resp 16   SpO2 95%  Gen:   Awake, no distress  Resp:  Normal effort  MSK:   Moves extremities without difficulty  Other:  Cranial nerves III through XII grossly intact.  Patient with subjective numbness appreciated on left crown of head as well as left anterior forearm, left calf and sole of foot.  Possible decreased strength with left grip of hand as well as with plantarflexion of left foot.  Medical Decision Making  Medically screening exam initiated at 1:39 PM.  Appropriate orders placed.  Janson Lamar was informed that the remainder of the evaluation will be completed by another provider, this initial triage assessment does not replace that evaluation, and the importance of remaining in the ED until their evaluation is complete.     Peter Garter, [place] 04/04/23 1341

## 2023-04-04 NOTE — Consult Note (Addendum)
Stroke Neurology Consultation Note  Consult Requested by: Dr. Posey Rea  Reason for Consult: left sided numbness  Consult Date: 04/04/23   The history was obtained from the pt.  During history and examination, all items were able to obtain unless otherwise noted.  History of Present Illness:  Victor Burns is a 32 y.o. African American male with PMH of HTN, preDM, recent acute prostatitis on cipro, substance use presented for left sided numbness. Per pt, he had cocaine and THC around midnight, this morning he was fine until 10am, he had numbness at the top of head and the feeling then came down to left face and left arm and leg. He felt dizzy and feeling of passing out. He used cold water to his body and not effective. He came to ER for evaluation. Glucose 60 and received ample of glucose in CT scanner. He also started cipro recently for acute prostatitis. Currently he still has some numbness at the top of head, at the left forearm and left foot. He also felt heaviness on walking with left leg.   LSN: 10am tPA Given: No: mild nondisabling symptoms   Past Medical History:  Diagnosis Date   Abscess    Anxiety    Arthritis    knee, back   Asthma    Elevated cholesterol    Kidney stones     Past Surgical History:  Procedure Laterality Date   EVALUATION UNDER ANESTHESIA WITH HEMORRHOIDECTOMY N/A 08/08/2019   Procedure: EXAM UNDER ANESTHESIA WITH IRRIGATION AND DRAINAGE OF PERIRECTAL ABSCESS;  Surgeon: Andria Meuse, MD;  Location: [facility];  Service: General;  Laterality: N/A;   FISTULOTOMY N/A 02/20/2020   Procedure: PARTIAL  FISTULOTOMY WITH PLACEMENT OF CUTTING TETON;  Surgeon: Andria Meuse, MD;  Location: [facility];  Service: General;  Laterality: N/A;   INCISION AND DRAINAGE PERIRECTAL ABSCESS N/A 06/16/2018   Procedure: Francia Greaves UNDER ANESTHESIA IRRIGATION AND DEBRIDEMENT PERIRECTAL ABSCESS;  Surgeon: Emelia Loron, MD;  Location: [facility];  Service: General;  Laterality: N/A;    KNEE ARTHROSCOPY     PLACEMENT OF SETON  08/08/2019   Procedure: PLACEMENT OF DRAINING SETON;  Surgeon: Andria Meuse, MD;  Location: [facility];  Service: General;;   RECTAL EXAM UNDER ANESTHESIA N/A 02/20/2020   Procedure: ANORECTAL EXAM UNDER ANESTHESIA;  Surgeon: Andria Meuse, MD;  Location: [facility];  Service: General;  Laterality: N/A;    Family History  Problem Relation Age of Onset   Hypertension Mother    Hypertension Father     Social History:  reports that he has been smoking cigarettes. He has a 2.50 pack-year smoking history. He has been exposed to tobacco smoke. He has never used smokeless tobacco. He reports current alcohol use of about 10.0 standard drinks of alcohol per week. He reports current drug use. Frequency: 3.00 times per week. Drug: Marijuana.  Allergies:  Allergies  Allergen Reactions   Bee Venom Anaphylaxis   Shellfish Allergy Anaphylaxis    Pt says not allergic anymore   Compazine [Prochlorperazine] Other (See Comments)    Trembling, feeling hot.    Other Itching    CHG wipes    No current facility-administered medications on file prior to encounter.   Current Outpatient Medications on File Prior to Encounter  Medication Sig Dispense Refill   acetaminophen (TYLENOL) 500 MG tablet Take 500 mg by mouth every 6 (six) hours as needed.     albuterol (PROVENTIL) (2.5 MG/3ML) 0.083% nebulizer solution Take 3 mLs (2.5 mg  total) by nebulization every 4 (four) hours as needed for wheezing or shortness of breath. 75 mL 2   albuterol (VENTOLIN HFA) 108 (90 Base) MCG/ACT inhaler Inhale 2 puffs into the lungs every 6 (six) hours as needed for wheezing or shortness of breath. 8 g 2   atorvastatin (LIPITOR) 20 MG tablet Take 1 tablet (20 mg total) by mouth daily. 30 tablet 2   budesonide-formoterol (SYMBICORT) 160-4.5 MCG/ACT inhaler Inhale 2 puffs into the lungs in the morning and at bedtime. 10.2 g 5   ciprofloxacin (CIPRO) 500 MG tablet Take 1 tablet (500 mg  total) by mouth 2 (two) times daily for 7 days. 14 tablet 0   Omega-3 Fatty Acids (FISH OIL) 500 MG CAPS Take 500 mg by mouth daily.     omeprazole (PRILOSEC) 20 MG capsule Take 1 capsule (20 mg total) by mouth daily. (Patient not taking: Reported on 09/29/2022) 90 capsule 1    Review of Systems: A full ROS was attempted today and was able to be performed.  Systems assessed include - Constitutional, Eyes, HENT, Respiratory, Cardiovascular, Gastrointestinal, Genitourinary, Integument/breast, Hematologic/lymphatic, Musculoskeletal, Neurological, Behavioral/Psych, Endocrine, Allergic/Immunologic - with pertinent responses as per HPI.  Physical Examination: Temp:  [98.4 F (36.9 C)] 98.4 F (36.9 C) (04/29 1212) Pulse Rate:  [57-68] 67 (04/29 1422) Resp:  [12-16] 15 (04/29 1422) BP: (141)/(76) 141/76 (04/29 1212) SpO2:  [95 %-100 %] 98 % (04/29 1422)  General - well nourished, well developed, in no apparent distress.    Ophthalmologic - fundi not visualized due to noncooperation.    Cardiovascular - regular rhythm and rate  Mental Status -  Level of arousal and orientation to time, place, and person were intact. Language including expression, naming, repetition, comprehension was assessed and found intact. Fund of Knowledge was assessed and was intact.  Cranial Nerves II - XII - II - Vision intact OU. III, IV, VI - Extraocular movements intact. V - Facial sensation intact bilaterally. VII - Facial movement intact bilaterally. VIII - Hearing & vestibular intact bilaterally. X - Palate elevates symmetrically. XI - Chin turning & shoulder shrug intact bilaterally. XII - Tongue protrusion intact.  Motor Strength - The patient's strength was normal in all extremities and pronator drift was absent.   Motor Tone & Bulk - Muscle tone was assessed at the neck and appendages and was normal.  Mild giveaway weakness on the left. Bulk was normal and fasciculations were absent.   Reflexes -  The patient's reflexes were normal in all extremities and he had no pathological reflexes.  Sensory - Light touch, temperature/pinprick were assessed and were normal except decreased light touch sensation at left forearm and left LE.    Coordination - The patient had normal movements in the hands with no ataxia or dysmetria.  Tremor was absent.  Gait and Station - deferred  Data Reviewed: CT HEAD CODE STROKE WO CONTRAST  Result Date: 04/04/2023 CLINICAL DATA:  Code stroke.  Neuro deficit, acute, stroke suspected EXAM: CT HEAD WITHOUT CONTRAST TECHNIQUE: Contiguous axial images were obtained from the base of the skull through the vertex without intravenous contrast. RADIATION DOSE REDUCTION: This exam was performed according to the departmental dose-optimization program which includes automated exposure control, adjustment of the mA and/or kV according to patient size and/or use of iterative reconstruction technique. COMPARISON:  None Available. FINDINGS: Brain: No evidence of acute large vascular territory infarction, hemorrhage, hydrocephalus, extra-axial collection or mass lesion/mass effect. Vascular: No hyperdense vessel. Skull: No acute fracture. Sinuses/Orbits:  Mild paranasal sinus mucosal Other: No mastoid effusions. ASPECTS [facility] Stroke Program Early CT Score) total score (0-10 with 10 being normal): 10. IMPRESSION: 1. No evidence of acute intracranial abnormality. 2. ASPECTS is 10. Electronically Signed   By: Feliberto Harts M.D.   On: 04/04/2023 14:17    Assessment: 32 y.o. male with PMH of HTN, preDM, recent acute prostatitis on cipro, substance use presented for left sided numbness. Symptoms seem improved some. NIHSS = 1. LSW 10am. Pt not TNK candidate given mild nondisabling symptoms. CT and CTA head / neck unremarkable. Etiology for his symptoms not quite clear, could be substance related, hypoglycemia, or cipro side effect. Will need MRI to rule out stroke also.   Stroke Risk  Factors -  substance abuse, HTN, preDM  Plan: Continue further stroke and encephalopathy work up  Frequent neuro checks Telemetry monitoring MRI brain  Echocardiogram  UDS, fasting lipid panel and HgbA1C PT/OT/speech consult Permissive hypertension (only treat if BP > 220/120 unless a lower blood pressure is clinically necessary) for 24-48 hours post stroke onset Stroke risk factor modification Substance cessation education CBG monitoring Discussed with Dr. Posey Rea ED physician We will follow   Thank you for this consultation and allowing [place] to participate in the care of this patient.  Marvel Plan, MD PhD Stroke Neurology 04/04/2023 2:40 PM

## 2023-04-04 NOTE — ED Provider Notes (Signed)
[place] EMERGENCY DEPARTMENT AT [facility] Provider Note  CSN: [id number] Arrival date & time: 04/04/23 1207  Chief Complaint(s) Numbness  HPI Victor Burns is a 32 y.o. male with PMH recent acute prostatitis on ciprofloxacin, HTN, polysubstance abuse who presents emergency department for evaluation of left-sided numbness.  Patient reports cocaine and THC use around midnight last night.  Woke up this morning with numbness at the top of the head which then extended down into the face, left arm and left leg.  Patient arrives hyperglycemic with glucose of 60.  Patient arrives as a stroke alert.  Denies chest pain, shortness of breath, abdominal pain, nausea, vomiting or other systemic symptoms.   Past Medical History Past Medical History:  Diagnosis Date   Abscess    Anxiety    Arthritis    knee, back   Asthma    Elevated cholesterol    Kidney stones    Patient Active Problem List   Diagnosis Date Noted   Prediabetes 06/03/2022   Hyperlipidemia 06/03/2022   GERD (gastroesophageal reflux disease) 11/24/2021   Perirectal abscess s/p I&D 06/16/2018 06/16/2018   Alcohol abuse 06/16/2018   Mild persistent asthma 10/07/2017   Home Medication(s) Prior to Admission medications   Medication Sig Start Date End Date Taking? Authorizing Provider  albuterol (PROVENTIL) (2.5 MG/3ML) 0.083% nebulizer solution Take 3 mLs (2.5 mg total) by nebulization every 4 (four) hours as needed for wheezing or shortness of breath. 11/11/22 11/11/23 Yes Cheron Schaumann K, PA-C  atorvastatin (LIPITOR) 20 MG tablet Take 1 tablet (20 mg total) by mouth daily. 02/22/23 05/23/23 Yes Georganna Skeans, MD  calcium carbonate (TUMS - DOSED IN MG ELEMENTAL CALCIUM) 500 MG chewable tablet Chew 2 tablets by mouth daily. As needed.   Yes [provider]  ciprofloxacin (CIPRO) 500 MG tablet Take 1 tablet (500 mg total) by mouth 2 (two) times daily for 7 days. 04/01/23 04/08/23 Yes Banister, Janace Aris, MD  Omega-3  Fatty Acids (FISH OIL) 500 MG CAPS Take 1,000 mg by mouth daily.   Yes [provider]  acetaminophen (TYLENOL) 500 MG tablet Take 500 mg by mouth every 6 (six) hours as needed.    [provider]  albuterol (VENTOLIN HFA) 108 (90 Base) MCG/ACT inhaler Inhale 2 puffs into the lungs every 6 (six) hours as needed for wheezing or shortness of breath. 02/11/23   Georganna Skeans, MD                                                                                                                                    Past Surgical History Past Surgical History:  Procedure Laterality Date   EVALUATION UNDER ANESTHESIA WITH HEMORRHOIDECTOMY N/A 08/08/2019   Procedure: EXAM UNDER ANESTHESIA WITH IRRIGATION AND DRAINAGE OF PERIRECTAL ABSCESS;  Surgeon: Andria Meuse, MD;  Location: [facility];  Service: General;  Laterality: N/A;   FISTULOTOMY N/A 02/20/2020   Procedure: PARTIAL  FISTULOTOMY WITH PLACEMENT OF CUTTING TETON;  Surgeon: Andria Meuse, MD;  Location: [facility];  Service: General;  Laterality: N/A;   INCISION AND DRAINAGE PERIRECTAL ABSCESS N/A 06/16/2018   Procedure: Francia Greaves UNDER ANESTHESIA IRRIGATION AND DEBRIDEMENT PERIRECTAL ABSCESS;  Surgeon: Emelia Loron, MD;  Location: [facility];  Service: General;  Laterality: N/A;   KNEE ARTHROSCOPY     PLACEMENT OF SETON  08/08/2019   Procedure: PLACEMENT OF DRAINING SETON;  Surgeon: Andria Meuse, MD;  Location: [facility];  Service: General;;   RECTAL EXAM UNDER ANESTHESIA N/A 02/20/2020   Procedure: ANORECTAL EXAM UNDER ANESTHESIA;  Surgeon: Andria Meuse, MD;  Location: [facility];  Service: General;  Laterality: N/A;   Family History Family History  Problem Relation Age of Onset   Hypertension Mother    Hypertension Father     Social History Social History   Tobacco Use   Smoking status: Some Days    Packs/day: 0.25    Years: 10.00    Additional pack years: 0.00    Total pack years: 2.50    Types: Cigarettes     Passive exposure: Current   Smokeless tobacco: Never   Tobacco comments:    Smokes on weekends   Vaping Use   Vaping Use: Never used  Substance Use Topics   Alcohol use: Yes    Alcohol/week: 10.0 standard drinks of alcohol    Types: 10 Cans of beer per week    Comment: 2 bottles liquor/ per wk   Drug use: Yes    Frequency: 3.0 times per week    Types: Marijuana    Comment: Pt reports smoking every other day (2g)   Allergies Bee venom, Shellfish allergy, Compazine [prochlorperazine], and Other  Review of Systems Review of Systems  Neurological:  Positive for numbness.    Physical Exam Vital Signs  I have reviewed the triage vital signs BP (!) 140/86 (BP Location: Right Arm)   Pulse (!) 58   Temp 98.3 F (36.8 C) (Oral)   Resp 16   SpO2 97%  \ Physical Exam Constitutional:      General: He is not in acute distress.    Appearance: Normal appearance.  HENT:     Head: Normocephalic and atraumatic.     Nose: No congestion or rhinorrhea.  Eyes:     General:        Right eye: No discharge.        Left eye: No discharge.     Extraocular Movements: Extraocular movements intact.     Pupils: Pupils are equal, round, and reactive to light.  Cardiovascular:     Rate and Rhythm: Normal rate and regular rhythm.     Heart sounds: No murmur heard. Pulmonary:     Effort: No respiratory distress.     Breath sounds: No wheezing or rales.  Abdominal:     General: There is no distension.     Tenderness: There is no abdominal tenderness.  Musculoskeletal:        General: Normal range of motion.     Cervical back: Normal range of motion.  Skin:    General: Skin is warm and dry.  Neurological:     General: No focal deficit present.     Mental Status: He is alert.     Sensory: Sensory deficit present.     ED Results and Treatments Labs (all labs ordered are listed, but only abnormal results are displayed) Labs Reviewed  CBC - Abnormal; Notable  for the following  components:      Result Value   Hemoglobin 12.7 (*)    HCT 37.9 (*)    All other components within normal limits  COMPREHENSIVE METABOLIC PANEL - Abnormal; Notable for the following components:   AST 91 (*)    ALT 47 (*)    All other components within normal limits  RAPID URINE DRUG SCREEN, HOSP PERFORMED - Abnormal; Notable for the following components:   Cocaine POSITIVE (*)    Tetrahydrocannabinol POSITIVE (*)    All other components within normal limits  URINALYSIS, ROUTINE W REFLEX MICROSCOPIC - Abnormal; Notable for the following components:   Specific Gravity, Urine >1.046 (*)    Glucose, UA 50 (*)    Ketones, ur 20 (*)    All other components within normal limits  CBG MONITORING, ED - Abnormal; Notable for the following components:   Glucose-Capillary 60 (*)    All other components within normal limits  CBG MONITORING, ED - Abnormal; Notable for the following components:   Glucose-Capillary 192 (*)    All other components within normal limits  ETHANOL  PROTIME-INR  APTT  DIFFERENTIAL  I-STAT CHEM 8, ED  CBG MONITORING, ED                                                                                                                          Radiology MR BRAIN WO CONTRAST  Result Date: 04/04/2023 CLINICAL DATA:  Neuro deficit, acute, stroke suspected. EXAM: MRI HEAD WITHOUT CONTRAST TECHNIQUE: Multiplanar, multiecho pulse sequences of the brain and surrounding structures were obtained without intravenous contrast. COMPARISON:  CTA head/neck 04/04/2023. FINDINGS: Brain: No acute infarct or hemorrhage. No mass or midline shift. No hydrocephalus or extra-axial collection. No abnormal susceptibility. Vascular: Normal flow voids. Skull and upper cervical spine: Normal marrow signal. Sinuses/Orbits: Unremarkable. Other: None. IMPRESSION: Normal brain MRI.  No acute infarct. Electronically Signed   By: Orvan Falconer M.D.   On: 04/04/2023 19:11   CT ANGIO HEAD NECK W WO CM (CODE  STROKE)  Result Date: 04/04/2023 CLINICAL DATA:  Neuro deficit, acute, stroke suspected EXAM: CT ANGIOGRAPHY HEAD AND NECK WITH AND WITHOUT CONTRAST TECHNIQUE: Multidetector CT imaging of the head and neck was performed using the standard protocol during bolus administration of intravenous contrast. Multiplanar CT image reconstructions and MIPs were obtained to evaluate the vascular anatomy. Carotid stenosis measurements (when applicable) are obtained utilizing NASCET criteria, using the distal internal carotid diameter as the denominator. RADIATION DOSE REDUCTION: This exam was performed according to the departmental dose-optimization program which includes automated exposure control, adjustment of the mA and/or kV according to patient size and/or use of iterative reconstruction technique. CONTRAST:  75mL OMNIPAQUE IOHEXOL 350 MG/ML SOLN COMPARISON:  None Available. FINDINGS: CTA NECK FINDINGS Aortic arch: Great vessel origins are patent without significant stenosis. Right carotid system: No evidence of dissection, stenosis (50% or greater), or occlusion. Left carotid system: No evidence of dissection, stenosis (50% or greater),  or occlusion. Vertebral arteries: Codominant. No evidence of dissection, stenosis (50% or greater), or occlusion. Skeleton: Negative. Other neck: Negative. Upper chest: Negative. Review of the MIP images confirms the above findings CTA HEAD FINDINGS Anterior circulation: Bilateral intracranial ICAs, MCAs, and ACAs are patent without proximal hemodynamically significant stenosis. Posterior circulation: Bilateral intradural vertebral arteries, basilar artery and bilateral posterior cerebral arteries are patent. Severe left P2 PCA stenosis. Venous sinuses: As permitted by contrast timing, patent. Review of the MIP images confirms the above findings IMPRESSION: 1. No large vessel occlusion. 2. Severe left P2 PCA stenosis. Electronically Signed   By: Feliberto Harts M.D.   On: 04/04/2023  14:36   CT HEAD CODE STROKE WO CONTRAST  Result Date: 04/04/2023 CLINICAL DATA:  Code stroke.  Neuro deficit, acute, stroke suspected EXAM: CT HEAD WITHOUT CONTRAST TECHNIQUE: Contiguous axial images were obtained from the base of the skull through the vertex without intravenous contrast. RADIATION DOSE REDUCTION: This exam was performed according to the departmental dose-optimization program which includes automated exposure control, adjustment of the mA and/or kV according to patient size and/or use of iterative reconstruction technique. COMPARISON:  None Available. FINDINGS: Brain: No evidence of acute large vascular territory infarction, hemorrhage, hydrocephalus, extra-axial collection or mass lesion/mass effect. Vascular: No hyperdense vessel. Skull: No acute fracture. Sinuses/Orbits: Mild paranasal sinus mucosal Other: No mastoid effusions. ASPECTS [facility] Stroke Program Early CT Score) total score (0-10 with 10 being normal): 10. IMPRESSION: 1. No evidence of acute intracranial abnormality. 2. ASPECTS is 10. Electronically Signed   By: Feliberto Harts M.D.   On: 04/04/2023 14:17    Pertinent labs & imaging results that were available during my care of the patient were reviewed by me and considered in my medical decision making (see MDM for details).  Medications Ordered in ED Medications  iohexol (OMNIPAQUE) 350 MG/ML injection 75 mL (75 mLs Intravenous Contrast Given 04/04/23 1415)  LORazepam (ATIVAN) injection 1 mg (1 mg Intravenous Given 04/04/23 1822)  metoCLOPramide (REGLAN) injection 10 mg (10 mg Intravenous Given 04/04/23 1933)  diphenhydrAMINE (BENADRYL) injection 25 mg (25 mg Intravenous Given 04/04/23 1932)  sodium chloride 0.9 % bolus 1,000 mL (1,000 mLs Intravenous New Bag/Given 04/04/23 1931)                                                                                                                                     Procedures .Critical Care  Performed by: Glendora Score, MD Authorized by: Glendora Score, MD   Critical care provider statement:    Critical care time (minutes):  30   Critical care was necessary to treat or prevent imminent or life-threatening deterioration of the following conditions:  CNS failure or compromise   Critical care was time spent personally by me on the following activities:  Development of treatment plan with patient or surrogate, discussions with consultants, evaluation of patient's response to treatment, examination of patient, ordering and  review of laboratory studies, ordering and review of radiographic studies, ordering and performing treatments and interventions, pulse oximetry, re-evaluation of patient's condition and review of old charts   (including critical care time)  Medical Decision Making / ED Course   This patient presents to the ED for concern of numbness, this involves an extensive number of treatment options, and is a complaint that carries with it a high risk of complications and morbidity.  The differential diagnosis includes CVA, polysubstance abuse, VZV, facial nerve injury, hypoglycemia  MDM: Patient seen emerged part for evaluation of numbness.  Physical exam with subjective left-sided facial numbness and upper and lower extremity numbness.  Initial glucose is 60 and 1 amp of glucose given with improvement of symptoms.  Patient arrives as a stroke alert and stroke imaging obtained.  Stroke imaging with no acute findings but does show some P2 stenosis.  I spoke with the stroke neurologist Dr. Roda Shutters who does not recommend TNKase initiation and is recommending MRI with possible need for hospital observation.  Laboratory evaluation with a hemoglobin of 12.7, AST 91, ALT 47.  UDS positive for cocaine and THC.  At time of signout, patient pending MRI and reevaluation by oncoming provider.  Please see provider signout for continuation of workup.   Additional history obtained:  -External records from outside  source obtained and reviewed including: Chart review including previous notes, labs, imaging, consultation notes   Lab Tests: -I ordered, reviewed, and interpreted labs.   The pertinent results include:   Labs Reviewed  CBC - Abnormal; Notable for the following components:      Result Value   Hemoglobin 12.7 (*)    HCT 37.9 (*)    All other components within normal limits  COMPREHENSIVE METABOLIC PANEL - Abnormal; Notable for the following components:   AST 91 (*)    ALT 47 (*)    All other components within normal limits  RAPID URINE DRUG SCREEN, HOSP PERFORMED - Abnormal; Notable for the following components:   Cocaine POSITIVE (*)    Tetrahydrocannabinol POSITIVE (*)    All other components within normal limits  URINALYSIS, ROUTINE W REFLEX MICROSCOPIC - Abnormal; Notable for the following components:   Specific Gravity, Urine >1.046 (*)    Glucose, UA 50 (*)    Ketones, ur 20 (*)    All other components within normal limits  CBG MONITORING, ED - Abnormal; Notable for the following components:   Glucose-Capillary 60 (*)    All other components within normal limits  CBG MONITORING, ED - Abnormal; Notable for the following components:   Glucose-Capillary 192 (*)    All other components within normal limits  ETHANOL  PROTIME-INR  APTT  DIFFERENTIAL  I-STAT CHEM 8, ED  CBG MONITORING, ED      EKG   EKG Interpretation  Date/Time:  Monday April 04 2023 13:05:09 EDT Ventricular Rate:  63 PR Interval:  184 QRS Duration: 88 QT Interval:  422 QTC Calculation: 431 R Axis:   52 Text Interpretation: Sinus rhythm with marked sinus arrhythmia Benign early repolarization Abnormal ECG When compared with ECG of 15-Jun-2022 15:51, PREVIOUS ECG IS PRESENT Confirmed by Mccayla Shimada ([id number]) on 04/04/2023 8:10:37 PM         Imaging Studies ordered: I ordered imaging studies including CT head, CT angio brain and neck I independently visualized and interpreted imaging. I agree  with the radiologist interpretation   Medicines ordered and prescription drug management: Meds ordered this encounter  Medications  iohexol (OMNIPAQUE) 350 MG/ML injection 75 mL   LORazepam (ATIVAN) injection 1 mg   metoCLOPramide (REGLAN) injection 10 mg   diphenhydrAMINE (BENADRYL) injection 25 mg   sodium chloride 0.9 % bolus 1,000 mL    -I have reviewed the patients home medicines and have made adjustments as needed  Critical interventions Stroke alert with consideration of TNK  Consultations Obtained: I requested consultation with the stroke neurologist Dr.Xu ,  and discussed lab and imaging findings as well as pertinent plan - they recommend: MRI and reevaluation possible hospital observation   Cardiac Monitoring: The patient was maintained on a cardiac monitor.  I personally viewed and interpreted the cardiac monitored which showed an underlying rhythm of: NSR  Social Determinants of Health:  Factors impacting patients care include: Polysubstance use   Reevaluation: After the interventions noted above, I reevaluated the patient and found that they have :improved  Co morbidities that complicate the patient evaluation  Past Medical History:  Diagnosis Date   Abscess    Anxiety    Arthritis    knee, back   Asthma    Elevated cholesterol    Kidney stones       Dispostion: I considered admission for this patient, and disposition completion of imaging.  Please see provider signout for continuation of workup     Final Clinical Impression(s) / ED Diagnoses Final diagnoses:  None     @PCDICTATION @    Glendora Score, MD 04/04/23 2011

## 2023-04-04 NOTE — Discharge Instructions (Addendum)
You have some stenosis in the artery of your brain likely caused from cocaine use.  Please avoid using cocaine  Please follow-up with neurology  Return to ER if you have worse numbness or weakness or trouble speaking

## 2023-04-04 NOTE — ED Notes (Signed)
Patient transported to MRI 

## 2023-04-04 NOTE — Code Documentation (Signed)
Stroke Response Nurse Documentation Code Documentation  Victor Burns is a 32 y.o. male arriving to [facility]  via [facility] on 04/04/2023 with past medical hx of Cocaine Use.  Code stroke was activated by ED.   Patient from home where he was LKW at 1000 and now complaining of left facial, arm and leg numbness. Pt reports doing cocaine and marijuana last night at 0000. This morning, he got up and ate some pasta salad at 0930. Around 1000, he reported a sudden onset of head numbness and was brought in by EMS. While in triage, the patient was noted to have left arm and left leg numbness too.  Stroke team at the bedside on patient arrival. Labs drawn and patient cleared for CT.  Patient to CT with team. NIHSS 1, see documentation for details and code stroke times. Patient with left decreased sensation on exam. The following imaging was completed:  CT Head and CTA. Patient is not a candidate for IV Thrombolytic due to too mild to treat. Patient is not a candidate for IR due to no LVO noted per provider.   Care Plan: q30 NIHSS/VS until 1430, the q2 NIHSS/VS x 12 hours.   Bedside handoff with ED RN Mariane Duval.    Lucila Maine  Stroke Response RN

## 2023-04-04 NOTE — ED Triage Notes (Signed)
Reported numbness top of head onset 10am  admits to cocaine last pm

## 2023-04-05 ENCOUNTER — Encounter: Payer: Self-pay | Admitting: Family Medicine

## 2023-04-05 NOTE — Progress Notes (Signed)
Established Patient Office Visit  Subjective    Patient ID: Victor Burns, male    DOB: 11/30/1991  Age: 32 y.o. MRN: [id number]  CC:  Chief Complaint  Patient presents with   Chest Pain    HPI Suliman Termini presents with complaint of anterior chest wall pain for which he had also been seen at ED recently. He denies SOB.    Outpatient Encounter Medications as of 03/29/2023  Medication Sig   acetaminophen (TYLENOL) 500 MG tablet Take 500 mg by mouth every 6 (six) hours as needed.   albuterol (PROVENTIL) (2.5 MG/3ML) 0.083% nebulizer solution Take 3 mLs (2.5 mg total) by nebulization every 4 (four) hours as needed for wheezing or shortness of breath.   albuterol (VENTOLIN HFA) 108 (90 Base) MCG/ACT inhaler Inhale 2 puffs into the lungs every 6 (six) hours as needed for wheezing or shortness of breath.   atorvastatin (LIPITOR) 20 MG tablet Take 1 tablet (20 mg total) by mouth daily.   Omega-3 Fatty Acids (FISH OIL) 500 MG CAPS Take 1,000 mg by mouth daily.   [DISCONTINUED] budesonide-formoterol (SYMBICORT) 160-4.5 MCG/ACT inhaler Inhale 2 puffs into the lungs in the morning and at bedtime.   [DISCONTINUED] dicyclomine (BENTYL) 20 MG tablet Take 1 tablet (20 mg total) by mouth every 6 (six) hours as needed for spasms. (Patient not taking: Reported on 02/11/2023)   [DISCONTINUED] ibuprofen (ADVIL) 800 MG tablet Take 1 tablet (800 mg total) by mouth 3 (three) times daily. (Patient not taking: Reported on 03/29/2023)   [DISCONTINUED] omeprazole (PRILOSEC) 20 MG capsule Take 1 capsule (20 mg total) by mouth daily. (Patient not taking: Reported on 09/29/2022)   [DISCONTINUED] predniSONE (DELTASONE) 50 MG tablet One tablet a day (Patient not taking: Reported on 03/29/2023)   [EXPIRED] triamcinolone acetonide (KENALOG-40) injection 40 mg    No facility-administered encounter medications on file as of 03/29/2023.    Past Medical History:  Diagnosis Date   Abscess    Anxiety    Arthritis     knee, back   Asthma    Elevated cholesterol    Kidney stones     Past Surgical History:  Procedure Laterality Date   EVALUATION UNDER ANESTHESIA WITH HEMORRHOIDECTOMY N/A 08/08/2019   Procedure: EXAM UNDER ANESTHESIA WITH IRRIGATION AND DRAINAGE OF PERIRECTAL ABSCESS;  Surgeon: Andria Meuse, MD;  Location: [facility];  Service: General;  Laterality: N/A;   FISTULOTOMY N/A 02/20/2020   Procedure: PARTIAL  FISTULOTOMY WITH PLACEMENT OF CUTTING TETON;  Surgeon: Andria Meuse, MD;  Location: [facility];  Service: General;  Laterality: N/A;   INCISION AND DRAINAGE PERIRECTAL ABSCESS N/A 06/16/2018   Procedure: Francia Greaves UNDER ANESTHESIA IRRIGATION AND DEBRIDEMENT PERIRECTAL ABSCESS;  Surgeon: Emelia Loron, MD;  Location: [facility];  Service: General;  Laterality: N/A;   KNEE ARTHROSCOPY     PLACEMENT OF SETON  08/08/2019   Procedure: PLACEMENT OF DRAINING SETON;  Surgeon: Andria Meuse, MD;  Location: [facility];  Service: General;;   RECTAL EXAM UNDER ANESTHESIA N/A 02/20/2020   Procedure: ANORECTAL EXAM UNDER ANESTHESIA;  Surgeon: Andria Meuse, MD;  Location: [facility];  Service: General;  Laterality: N/A;    Family History  Problem Relation Age of Onset   Hypertension Mother    Hypertension Father     Social History   Socioeconomic History   Marital status: Single    Spouse name: Not on file   Number of children: Not on file   Years of education: Not on  file   Highest education level: Not on file  Occupational History   Occupation: Research scientist (medical) [employer]  Tobacco Use   Smoking status: Some Days    Packs/day: 0.25    Years: 10.00    Additional pack years: 0.00    Total pack years: 2.50    Types: Cigarettes    Passive exposure: Current   Smokeless tobacco: Never   Tobacco comments:    Smokes on weekends   Vaping Use   Vaping Use: Never used  Substance and Sexual Activity   Alcohol use: Yes    Alcohol/week: 10.0 standard drinks of alcohol    Types: 10 Cans of beer per  week    Comment: 2 bottles liquor/ per wk   Drug use: Yes    Frequency: 3.0 times per week    Types: Marijuana    Comment: Pt reports smoking every other day (2g)   Sexual activity: Not on file  Other Topics Concern   Not on file  Social History Narrative   ** Merged History Encounter **       Social Determinants of Health   Financial Resource Strain: Not on file  Food Insecurity: No Food Insecurity (02/11/2023)   Hunger Vital Sign    Worried About Running Out of Food in the Last Year: Never true    Ran Out of Food in the Last Year: Never true  Transportation Needs: Not on file  Physical Activity: Not on file  Stress: Not on file  Social Connections: Not on file  Intimate Partner Violence: Not At Risk (02/11/2023)   Humiliation, Afraid, Rape, and Kick questionnaire    Fear of Current or Ex-Partner: No    Emotionally Abused: No    Physically Abused: No    Sexually Abused: No    Review of Systems  Respiratory:  Negative for cough, shortness of breath and wheezing.   Cardiovascular:  Positive for chest pain. Negative for palpitations.  All other systems reviewed and are negative.       Objective    BP 134/88 (BP Location: Right Arm, Patient Position: Sitting, Cuff Size: Large)   Pulse 62   Temp 98 F (36.7 C) (Oral)   Resp 16   Ht 5\' 11"  (1.803 m)   Wt 259 lb (117.5 kg)   SpO2 98%   BMI 36.12 kg/m   Physical Exam Vitals and nursing note reviewed.  Constitutional:      General: He is not in acute distress. Cardiovascular:     Rate and Rhythm: Normal rate and regular rhythm.  Pulmonary:     Effort: Pulmonary effort is normal.     Breath sounds: Normal breath sounds.  Chest:     Chest wall: Tenderness (left upper chest - reproducible with palpation) present.  Abdominal:     Palpations: Abdomen is soft.     Tenderness: There is no abdominal tenderness.  Neurological:     General: No focal deficit present.     Mental Status: He is alert and oriented to  person, place, and time.         Assessment & Plan:   1. Anterior chest wall pain Referral to PT. Kenalog IM injection given. Tylenol/nsaids prn.  - Ambulatory referral to Physical Therapy - triamcinolone acetonide (KENALOG-40) injection 40 mg    Return if symptoms worsen or fail to improve.   Tommie Raymond, MD

## 2023-04-06 ENCOUNTER — Telehealth: Payer: BC Managed Care – PPO | Admitting: Family Medicine

## 2023-04-06 DIAGNOSIS — F17210 Nicotine dependence, cigarettes, uncomplicated: Secondary | ICD-10-CM | POA: Diagnosis not present

## 2023-04-06 DIAGNOSIS — Z716 Tobacco abuse counseling: Secondary | ICD-10-CM | POA: Diagnosis not present

## 2023-04-06 MED ORDER — NICOTINE POLACRILEX 4 MG MT GUM
4.0000 mg | CHEWING_GUM | OROMUCOSAL | 0 refills | Status: DC | PRN
Start: 2023-04-06 — End: 2023-09-20

## 2023-04-06 NOTE — Patient Instructions (Addendum)
Rolena Infante, thank you for joining Freddy Finner, NP for today's virtual visit.  While this provider is not your primary care provider (PCP), if your PCP is located in our provider database this encounter information will be shared with them immediately following your visit.   A [facility] MyChart account gives you access to today's visit and all your visits, tests, and labs performed at [facility] " click here if you don't have a [facility] MyChart account or go to [email or web address]  Consent: (Patient) Janathan Bribiesca provided verbal consent for this virtual visit at the beginning of the encounter.  Current Medications:  Current Outpatient Medications:    nicotine polacrilex (NICORETTE) 4 MG gum, Take 1 each (4 mg total) by mouth as needed for smoking cessation., Disp: 100 tablet, Rfl: 0   acetaminophen (TYLENOL) 500 MG tablet, Take 500 mg by mouth every 6 (six) hours as needed., Disp: , Rfl:    albuterol (PROVENTIL) (2.5 MG/3ML) 0.083% nebulizer solution, Take 3 mLs (2.5 mg total) by nebulization every 4 (four) hours as needed for wheezing or shortness of breath., Disp: 75 mL, Rfl: 2   albuterol (VENTOLIN HFA) 108 (90 Base) MCG/ACT inhaler, Inhale 2 puffs into the lungs every 6 (six) hours as needed for wheezing or shortness of breath., Disp: 8 g, Rfl: 2   atorvastatin (LIPITOR) 20 MG tablet, Take 1 tablet (20 mg total) by mouth daily., Disp: 30 tablet, Rfl: 2   calcium carbonate (TUMS - DOSED IN MG ELEMENTAL CALCIUM) 500 MG chewable tablet, Chew 2 tablets by mouth daily. As needed., Disp: , Rfl:    ciprofloxacin (CIPRO) 500 MG tablet, Take 1 tablet (500 mg total) by mouth 2 (two) times daily for 7 days., Disp: 14 tablet, Rfl: 0   Omega-3 Fatty Acids (FISH OIL) 500 MG CAPS, Take 1,000 mg by mouth daily., Disp: , Rfl:    Medications ordered in this encounter:  Meds ordered this encounter  Medications   nicotine polacrilex (NICORETTE) 4 MG gum    Sig: Take 1 each (4  mg total) by mouth as needed for smoking cessation.    Dispense:  100 tablet    Refill:  0    Order Specific Question:   Supervising Provider    Answer:   Merrilee Jansky [id number]     *If you need refills on other medications prior to your next appointment, please contact your pharmacy*  Follow-Up: Call back or seek an in-person evaluation if the symptoms worsen or if the condition fails to improve as anticipated.  [facility] [phone]  Other Instructions   Please think about quitting smoking.  This is very important for your health.  Consider setting a quit date, then cutting back or switching brands to prepare to stop.  Also think of the money you will save every day by not smoking.  Quick Tips to Quit Smoking: Fix a date i.e. keep a date in mind from when you would not touch a tobacco product to smoke  Keep yourself busy and block your mind with work loads or reading books or watching movies in malls where smoking is not allowed  Vanish off the things which reminds you about smoking for example match box, or your favorite lighter, or the pipe you used for smoking, or your favorite jeans and shirt with which you used to enjoy smoking, or the club where you used to do smoking  Try to avoid certain people places and incidences where and  with whom smoking is a common factor to add on  Praise yourself with some token gifts from the money you saved by stopping smoking  Anti Smoking teams are there to help you. Join their programs  Anti-smoking Gums are there in many medical shops. Try them to quit smoking   Side-effects of Smoking: Disease caused by smoking cigarettes are emphysema, bronchitis, heart failures  Premature death  Cancer is the major side effect of smoking  Heart attacks and strokes are the quick effects of smoking causing sudden death  Some smokers lives end up with limbs amputated  Breathing problem or fast breathing is another side effect of  smoking  Due to more intakes of smokes, carbon mono-oxide goes into your brain and other muscles of the body which leads to swelling of the veins and blockage to the air passage to lungs  Carbon monoxide blocks blood vessels which leads to blockage in the flow of blood to different major body organs like heart lungs and thus leads to attacks and deaths  During pregnancy smoking is very harmful and leads to premature birth of the infant, spontaneous abortions, low weight of the infant during birth  Fat depositions to narrow and blocked blood vessels causing heart attacks  In many cases cigarette smoking caused infertility in men     If you have been instructed to have an in-person evaluation today at a local Urgent Care facility, please use the link below. It will take you to a list of all of our available [facility], including address, phone number and hours of operation. Please do not delay care.  [facility]  If you or a family member do not have a primary care provider, use the link below to schedule a visit and establish care. When you choose a [facility] primary care physician or advanced practice provider, you gain a long-term partner in health. Find a Primary Care Provider  Learn more about [facility] in-office and virtual care options: [facility] - Get Care Now

## 2023-04-06 NOTE — Progress Notes (Signed)
Virtual Visit Consent   Victor Burns, you are scheduled for a virtual visit with a [facility] provider today. Just as with appointments in the office, your consent must be obtained to participate. Your consent will be active for this visit and any virtual visit you may have with one of our providers in the next 365 days. If you have a MyChart account, a copy of this consent can be sent to you electronically.  As this is a virtual visit, video technology does not allow for your provider to perform a traditional examination. This may limit your provider's ability to fully assess your condition. If your provider identifies any concerns that need to be evaluated in person or the need to arrange testing (such as labs, EKG, etc.), we will make arrangements to do so. Although advances in technology are sophisticated, we cannot ensure that it will always work on either your end or our end. If the connection with a video visit is poor, the visit may have to be switched to a telephone visit. With either a video or telephone visit, we are not always able to ensure that we have a secure connection.  By engaging in this virtual visit, you consent to the provision of healthcare and authorize for your insurance to be billed (if applicable) for the services provided during this visit. Depending on your insurance coverage, you may receive a charge related to this service.  I need to obtain your verbal consent now. Are you willing to proceed with your visit today? Victor Burns has provided verbal consent on 04/06/2023 for a virtual visit (video or telephone). Freddy Finner, NP  Date: 04/06/2023 10:25 AM  Virtual Visit via Video Note   I, Freddy Finner, connected with  Victor Burns  ([id number], [birth year]) on 04/06/23 at 10:15 AM EDT by a video-enabled telemedicine application and verified that I am speaking with the correct person using two identifiers.  Location: Patient: Virtual Visit Location Patient:  Home Provider: Virtual Visit Location Provider: Home Office   I discussed the limitations of evaluation and management by telemedicine and the availability of in person appointments. The patient expressed understanding and agreed to proceed.    History of Present Illness: Victor Burns is a 32 y.o. who identifies as a male who was assigned male at birth, and is being seen today for Desires to stop smoking-   See note from recent ED visit on 04/04/2023 Wants to know what he can do to help him stop smoking. He is smoking around 2-3 cigarettes daily- sometimes more if out drinking sometimes less. 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    Problems:  Patient Active Problem List   Diagnosis Date Noted   Prediabetes 06/03/2022   Hyperlipidemia 06/03/2022   GERD (gastroesophageal reflux disease) 11/24/2021   Perirectal abscess s/p I&D 06/16/2018 06/16/2018   Alcohol abuse 06/16/2018   Mild persistent asthma 10/07/2017    Allergies:  Allergies  Allergen Reactions   Bee Venom Anaphylaxis   Shellfish Allergy Anaphylaxis    Pt says not allergic anymore   Compazine [Prochlorperazine] Other (See Comments)    Trembling, feeling hot.    Other Itching    CHG wipes   Medications:  Current Outpatient Medications:    nicotine polacrilex (NICORETTE) 4 MG gum, Take 1 each (4 mg total) by mouth as needed for smoking cessation., Disp: 100 tablet, Rfl: 0  acetaminophen (TYLENOL) 500 MG tablet, Take 500 mg by mouth every 6 (six) hours as needed., Disp: , Rfl:    albuterol (PROVENTIL) (2.5 MG/3ML) 0.083% nebulizer solution, Take 3 mLs (2.5 mg total) by nebulization every 4 (four) hours as needed for wheezing or shortness of breath., Disp: 75 mL, Rfl: 2   albuterol (VENTOLIN HFA) 108 (90 Base) MCG/ACT inhaler, Inhale 2 puffs into the lungs every 6 (six) hours as needed for wheezing or shortness of breath., Disp: 8 g, Rfl:  2   atorvastatin (LIPITOR) 20 MG tablet, Take 1 tablet (20 mg total) by mouth daily., Disp: 30 tablet, Rfl: 2   calcium carbonate (TUMS - DOSED IN MG ELEMENTAL CALCIUM) 500 MG chewable tablet, Chew 2 tablets by mouth daily. As needed., Disp: , Rfl:    ciprofloxacin (CIPRO) 500 MG tablet, Take 1 tablet (500 mg total) by mouth 2 (two) times daily for 7 days., Disp: 14 tablet, Rfl: 0   Omega-3 Fatty Acids (FISH OIL) 500 MG CAPS, Take 1,000 mg by mouth daily., Disp: , Rfl:   Observations/Objective: Patient is well-developed, well-nourished in no acute distress.  Resting comfortably  at home.  Head is normocephalic, atraumatic.  No labored breathing.  Speech is clear and coherent with logical content.  Patient is alert and oriented at baseline.    Assessment and Plan: 1. Smokes cigarettes - nicotine polacrilex (NICORETTE) 4 MG gum; Take 1 each (4 mg total) by mouth as needed for smoking cessation.  Dispense: 100 tablet; Refill: 0  2. Encounter for smoking cessation counseling - nicotine polacrilex (NICORETTE) 4 MG gum; Take 1 each (4 mg total) by mouth as needed for smoking cessation.  Dispense: 100 tablet; Refill: 0  -advised gum for now for intermittent use given the low level of smoking at this time- rather not use patch if he goes sometimes all day without smoking. -thinking Wellbutrin might be best to avoid nicotine all together -follow up w PCP and neuro as planned  Asked about quitting: confirms they are currently smokes cigarettes Advise to quit smoking: Educated about QUITTING to reduce the risk of cancer, cardio and cerebrovascular disease. Assess willingness: Unwilling to quit at this time, but is working on cutting back. Assist with counseling and pharmacotherapy: Counseled for 5 minutes and literature provided. Arrange for follow up:  quitting follow up with PCP    Follow Up Instructions: I discussed the assessment and treatment plan with the patient. The patient was  provided an opportunity to ask questions and all were answered. The patient agreed with the plan and demonstrated an understanding of the instructions.  A copy of instructions were sent to the patient via MyChart unless otherwise noted below.    The patient was advised to call back or seek an in-person evaluation if the symptoms worsen or if the condition fails to improve as anticipated.  Time:  I spent 10 minutes with the patient via telehealth technology discussing the above problems/concerns.    Freddy Finner, NP

## 2023-04-18 NOTE — Therapy (Deleted)
OUTPATIENT PHYSICAL THERAPY CERVICAL EVALUATION   Patient Name: Victor Burns MRN: [id number] DOB:May 13, 1991, 32 y.o., male Today's Date: 04/18/2023  END OF SESSION:   Past Medical History:  Diagnosis Date   Abscess    Anxiety    Arthritis    knee, back   Asthma    Elevated cholesterol    Kidney stones    Past Surgical History:  Procedure Laterality Date   EVALUATION UNDER ANESTHESIA WITH HEMORRHOIDECTOMY N/A 08/08/2019   Procedure: EXAM UNDER ANESTHESIA WITH IRRIGATION AND DRAINAGE OF PERIRECTAL ABSCESS;  Surgeon: Andria Meuse, MD;  Location: [facility];  Service: General;  Laterality: N/A;   FISTULOTOMY N/A 02/20/2020   Procedure: PARTIAL  FISTULOTOMY WITH PLACEMENT OF CUTTING TETON;  Surgeon: Andria Meuse, MD;  [facility];  Service: General;  Laterality: N/A;   INCISION AND DRAINAGE PERIRECTAL ABSCESS N/A 06/16/2018   Procedure: Francia Greaves UNDER ANESTHESIA IRRIGATION AND DEBRIDEMENT PERIRECTAL ABSCESS;  Surgeon: Emelia Loron, MD;  Location: [facility];  Service: General;  Laterality: N/A;   KNEE ARTHROSCOPY     PLACEMENT OF SETON  08/08/2019   Procedure: PLACEMENT OF DRAINING SETON;  Surgeon: Andria Meuse, MD;  [facility];  Service: General;;   RECTAL EXAM UNDER ANESTHESIA N/A 02/20/2020   Procedure: ANORECTAL EXAM UNDER ANESTHESIA;  Surgeon: Andria Meuse, MD;  Location: [facility];  Service: General;  Laterality: N/A;   Patient Active Problem List   Diagnosis Date Noted   Prediabetes 06/03/2022   Hyperlipidemia 06/03/2022   GERD (gastroesophageal reflux disease) 11/24/2021   Perirectal abscess s/p I&D 06/16/2018 06/16/2018   Alcohol abuse 06/16/2018   Mild persistent asthma 10/07/2017    PCP: Georganna Skeans, MD   REFERRING PROVIDER: Georganna Skeans, MD  REFERRING DIAG: R07.89 (ICD-10-CM) - Anterior chest wall pain  THERAPY DIAG:  No diagnosis found.  Rationale for Evaluation and Treatment: Rehabilitation  ONSET DATE:  04/04/23  SUBJECTIVE:                                                                                                                                                                                                         SUBJECTIVE STATEMENT: *** Hand dominance: {MISC; OT HAND [phone]}  PERTINENT HISTORY:  1. Anterior chest wall pain Referral to PT. Kenalog IM injection given. Tylenol/nsaids prn.   PAIN:  Are you having pain? {OPRCPAIN:27236}  PRECAUTIONS: None  WEIGHT BEARING RESTRICTIONS: No  FALLS:  Has patient fallen in last 6 months? No  OCCUPATION: ***  PLOF: Independent  PATIENT GOALS: ***  NEXT MD VISIT: PRN  OBJECTIVE:   DIAGNOSTIC FINDINGS:  None   PATIENT SURVEYS:  FOTO ***  POSTURE: {posture:25561}  PALPATION: ***   CERVICAL ROM:   {AROM/PROM:27142} ROM A/PROM (deg) eval  Flexion   Extension   Right lateral flexion   Left lateral flexion   Right rotation   Left rotation    (Blank rows = not tested)  UPPER EXTREMITY ROM:  {AROM/PROM:27142} ROM Right eval Left eval  Shoulder flexion    Shoulder extension    Shoulder abduction    Shoulder adduction    Shoulder extension    Shoulder internal rotation    Shoulder external rotation    Elbow flexion    Elbow extension    Wrist flexion    Wrist extension    Wrist ulnar deviation    Wrist radial deviation    Wrist pronation    Wrist supination     (Blank rows = not tested)  UPPER EXTREMITY MMT:  MMT Right eval Left eval  Shoulder flexion    Shoulder extension    Shoulder abduction    Shoulder adduction    Shoulder extension    Shoulder internal rotation    Shoulder external rotation    Middle trapezius    Lower trapezius    Elbow flexion    Elbow extension    Wrist flexion    Wrist extension    Wrist ulnar deviation    Wrist radial deviation    Wrist pronation    Wrist supination    Grip strength     (Blank rows = not tested)  CERVICAL SPECIAL TESTS:   {Cervical special tests:25246}  FUNCTIONAL TESTS:  5 times sit to stand: ***  TODAY'S TREATMENT:                                                                                                                              DATE: 04/21/23 Eval and HEP   PATIENT EDUCATION:  Education details: Discussed eval findings, rehab rationale and POC and patient is in agreement  Person educated: Patient Education method: Explanation Education comprehension: verbalized understanding and needs further education  HOME EXERCISE PROGRAM: ***  ASSESSMENT:  CLINICAL IMPRESSION: Patient is a 32 y.o. male who was seen today for physical therapy evaluation and treatment for ***.   OBJECTIVE IMPAIRMENTS: {opptimpairments:25111}.   ACTIVITY LIMITATIONS: {activitylimitations:27494}  PARTICIPATION LIMITATIONS: {participationrestrictions:25113}  PERSONAL FACTORS: {Personal factors:25162} are also affecting patient's functional outcome.   REHAB POTENTIAL: Good  CLINICAL DECISION MAKING: Stable/uncomplicated  EVALUATION COMPLEXITY: Low   GOALS: Goals reviewed with patient? No  SHORT TERM GOALS: Target date: ***  Patient to demonstrate independence in HEP  Baseline:  Goal status: {GOALSTATUS:25110}  2.  *** Baseline:  Goal status: {GOALSTATUS:25110}  3.  *** Baseline:  Goal status: {GOALSTATUS:25110}  4.  *** Baseline:  Goal status: {GOALSTATUS:25110}  5.  *** Baseline:  Goal status: {GOALSTATUS:25110}  6.  *** Baseline:  Goal status: {GOALSTATUS:25110}  LONG TERM GOALS:  Target date: ***  *** Baseline:  Goal status: {GOALSTATUS:25110}  2.  *** Baseline:  Goal status: {GOALSTATUS:25110}  3.  *** Baseline:  Goal status: {GOALSTATUS:25110}  4.  *** Baseline:  Goal status: {GOALSTATUS:25110}  5.  *** Baseline:  Goal status: {GOALSTATUS:25110}  6.  *** Baseline:  Goal status: {GOALSTATUS:25110}   PLAN:  PT FREQUENCY: 1-2x/week  PT DURATION: 4  weeks  PLANNED INTERVENTIONS: Therapeutic exercises, Therapeutic activity, Neuromuscular re-education, Balance training, Gait training, Patient/Family education, Self Care, Joint mobilization, Dry Needling, Manual therapy, and Re-evaluation  PLAN FOR NEXT SESSION: HEP review and update, manual techniques as appropriate, aerobic tasks, ROM and flexibility activities, strengthening and PREs, TPDN, gait and balance training as needed     Hildred Laser, PT 04/18/2023, 1:41 PM

## 2023-04-20 ENCOUNTER — Encounter: Payer: Self-pay | Admitting: Diagnostic Neuroimaging

## 2023-04-20 ENCOUNTER — Ambulatory Visit (INDEPENDENT_AMBULATORY_CARE_PROVIDER_SITE_OTHER): Payer: BC Managed Care – PPO | Admitting: Diagnostic Neuroimaging

## 2023-04-20 VITALS — BP 120/83 | HR 60 | Ht 71.0 in | Wt 260.0 lb

## 2023-04-20 DIAGNOSIS — G459 Transient cerebral ischemic attack, unspecified: Secondary | ICD-10-CM | POA: Diagnosis not present

## 2023-04-20 NOTE — Progress Notes (Signed)
[facility]  PATIENT: Victor Burns DOB: [birth year]  REFERRING CLINICIAN: Charlynne Pander, MD HISTORY FROM: PATIENT REASON FOR VISIT: NEW CONSULT   HISTORICAL  CHIEF COMPLAINT:  Chief Complaint  Patient presents with   New Patient (Initial Visit)    Pt with mom, rm 7, 2 weeks ago, Called 911 developed numbness on lt side of the body. Completed testing MRI, CT. Numbness has  He has been having headaches on the left side of head since then. He has quit cocaine use. He is also cutting back on ciggarettes. Unsure if headaches are related to that or the stenosis mentioned in CTA. He states that no one really reviewed the imaging and results from the CTA and MRI in hospital. He googled the stenosis and is very concerned    Other    Denies hx of headaches, states the headaches that are occurring happen intermittently and has not been able to pin point if there is something triggering     HISTORY OF PRESENT ILLNESS:   32 male here for evaluation of TIA.  03/29/2023 patient went to urgent care for chest pain.  On 04/01/2023 patient went to urgent care for UTI symptoms.  Then on 04/04/2023 went to emergency room for onset of headache and numbness radiating from left head, face to his left arm and left leg.  Had some dizziness and lightheadedness.  Glucose was noted to be slightly low at 60.  He called 911 and was taken to the hospital for evaluation.  Stroke workup was completed.  No acute stroke was found.  Symptoms gradually resolved.  Patient does have history of alcohol, tobacco and cocaine use.   REVIEW OF SYSTEMS: Full 14 system review of systems performed and negative with exception of: as per HPI.  ALLERGIES: Allergies  Allergen Reactions   Bee Venom Anaphylaxis   Shellfish Allergy Anaphylaxis    Pt says not allergic anymore   Compazine [Prochlorperazine] Other (See Comments)    Trembling, feeling hot.    Other Itching    CHG wipes    HOME  MEDICATIONS: Outpatient Medications Prior to Visit  Medication Sig Dispense Refill   acetaminophen (TYLENOL) 500 MG tablet Take 500 mg by mouth every 6 (six) hours as needed.     albuterol (PROVENTIL) (2.5 MG/3ML) 0.083% nebulizer solution Take 3 mLs (2.5 mg total) by nebulization every 4 (four) hours as needed for wheezing or shortness of breath. 75 mL 2   albuterol (VENTOLIN HFA) 108 (90 Base) MCG/ACT inhaler Inhale 2 puffs into the lungs every 6 (six) hours as needed for wheezing or shortness of breath. 8 g 2   atorvastatin (LIPITOR) 20 MG tablet Take 1 tablet (20 mg total) by mouth daily. 30 tablet 2   calcium carbonate (TUMS - DOSED IN MG ELEMENTAL CALCIUM) 500 MG chewable tablet Chew 2 tablets by mouth daily. As needed.     nicotine polacrilex (NICORETTE) 4 MG gum Take 1 each (4 mg total) by mouth as needed for smoking cessation. 100 tablet 0   Omega-3 Fatty Acids (FISH OIL) 500 MG CAPS Take 1,000 mg by mouth daily.     No facility-administered medications prior to visit.    PAST MEDICAL HISTORY: Past Medical History:  Diagnosis Date   Abscess    Anxiety    Arthritis    knee, back   Asthma    Elevated cholesterol    Kidney stones     PAST SURGICAL HISTORY: Past Surgical History:  Procedure Laterality  Date   EVALUATION UNDER ANESTHESIA WITH HEMORRHOIDECTOMY N/A 08/08/2019   Procedure: EXAM UNDER ANESTHESIA WITH IRRIGATION AND DRAINAGE OF PERIRECTAL ABSCESS;  Surgeon: Andria Meuse, MD;  Location: [facility];  Service: General;  Laterality: N/A;   FISTULOTOMY N/A 02/20/2020   Procedure: PARTIAL  FISTULOTOMY WITH PLACEMENT OF CUTTING TETON;  Surgeon: Andria Meuse, MD;  Location: [facility];  Service: General;  Laterality: N/A;   INCISION AND DRAINAGE PERIRECTAL ABSCESS N/A 06/16/2018   Procedure: Francia Greaves UNDER ANESTHESIA IRRIGATION AND DEBRIDEMENT PERIRECTAL ABSCESS;  Surgeon: Emelia Loron, MD;  Location: [facility];  Service: General;  Laterality: N/A;   KNEE ARTHROSCOPY      PLACEMENT OF SETON  08/08/2019   Procedure: PLACEMENT OF DRAINING SETON;  Surgeon: Andria Meuse, MD;  Location: [facility];  Service: General;;   RECTAL EXAM UNDER ANESTHESIA N/A 02/20/2020   Procedure: ANORECTAL EXAM UNDER ANESTHESIA;  Surgeon: Andria Meuse, MD;  Location: [facility];  Service: General;  Laterality: N/A;    FAMILY HISTORY: Family History  Problem Relation Age of Onset   Hypertension Mother    Hypertension Father     SOCIAL HISTORY: Social History   Socioeconomic History   Marital status: Single    Spouse name: Not on file   Number of children: Not on file   Years of education: Not on file   Highest education level: Not on file  Occupational History   Occupation: Research scientist (medical) [employer]  Tobacco Use   Smoking status: Some Days    Packs/day: 0.25    Years: 10.00    Additional pack years: 0.00    Total pack years: 2.50    Types: Cigarettes    Passive exposure: Current   Smokeless tobacco: Never   Tobacco comments:    Smokes on weekends   Vaping Use   Vaping Use: Never used  Substance and Sexual Activity   Alcohol use: Not Currently    Comment: 2 bottles liquor/ per wk   Drug use: Yes    Frequency: 3.0 times per week    Types: Marijuana    Comment: Pt reports smoking every other day (2g)   Sexual activity: Not on file  Other Topics Concern   Not on file  Social History Narrative   ** Merged History Encounter **       Social Determinants of Health   Financial Resource Strain: Not on file  Food Insecurity: No Food Insecurity (02/11/2023)   Hunger Vital Sign    Worried About Running Out of Food in the Last Year: Never true    Ran Out of Food in the Last Year: Never true  Transportation Needs: Not on file  Physical Activity: Not on file  Stress: Not on file  Social Connections: Not on file  Intimate Partner Violence: Not At Risk (02/11/2023)   Humiliation, Afraid, Rape, and Kick questionnaire    Fear of Current or Ex-Partner: No    Emotionally  Abused: No    Physically Abused: No    Sexually Abused: No     PHYSICAL EXAM  GENERAL EXAM/CONSTITUTIONAL: Vitals:  Vitals:   04/20/23 1004  BP: 120/83  Pulse: 60  Weight: 260 lb (117.9 kg)  Height: 5\' 11"  (1.803 m)   Body mass index is 36.26 kg/m. Wt Readings from Last 3 Encounters:  04/20/23 260 lb (117.9 kg)  03/29/23 259 lb (117.5 kg)  02/11/23 260 lb (117.9 kg)   Patient is in no distress; well developed, nourished and groomed; neck is  supple  CARDIOVASCULAR: Examination of carotid arteries is normal; no carotid bruits Regular rate and rhythm, no murmurs Examination of peripheral vascular system by observation and palpation is normal  EYES: Ophthalmoscopic exam of optic discs and posterior segments is normal; no papilledema or hemorrhages No results found.  MUSCULOSKELETAL: Gait, strength, tone, movements noted in Neurologic exam below  NEUROLOGIC: MENTAL STATUS:      No data to display         awake, alert, oriented to person, place and time recent and remote memory intact normal attention and concentration language fluent, comprehension intact, naming intact fund of knowledge appropriate  CRANIAL NERVE:  2nd - no papilledema on fundoscopic exam 2nd, 3rd, 4th, 6th - pupils equal and reactive to light, visual fields full to confrontation, extraocular muscles intact, no nystagmus 5th - facial sensation symmetric 7th - facial strength symmetric 8th - hearing intact 9th - palate elevates symmetrically, uvula midline 11th - shoulder shrug symmetric 12th - tongue protrusion midline  MOTOR:  normal bulk and tone, full strength in the BUE, BLE  SENSORY:  normal and symmetric to light touch, pinprick, temperature, vibration  COORDINATION:  finger-nose-finger, fine finger movements normal  REFLEXES:  deep tendon reflexes present and symmetric  GAIT/STATION:  narrow based gait; able to walk on toes, heels and tandem; romberg is  negative     DIAGNOSTIC DATA (LABS, IMAGING, TESTING) - I reviewed patient records, labs, notes, testing and imaging myself where available.  Lab Results  Component Value Date   WBC 4.8 04/04/2023   HGB 13.3 04/04/2023   HCT 39.0 04/04/2023   MCV 88.8 04/04/2023   PLT 299 04/04/2023      Component Value Date/Time   NA 140 04/04/2023 1404   NA 143 02/11/2023 1114   K 3.9 04/04/2023 1404   CL 107 04/04/2023 1404   CO2 22 04/04/2023 1354   GLUCOSE 79 04/04/2023 1404   BUN 17 04/04/2023 1404   BUN 17 02/11/2023 1114   CREATININE 1.00 04/04/2023 1404   CREATININE 1.01 04/03/2016 1549   CALCIUM 9.1 04/04/2023 1354   PROT 7.3 04/04/2023 1354   PROT 7.6 02/11/2023 1114   ALBUMIN 4.1 04/04/2023 1354   ALBUMIN 5.0 02/11/2023 1114   AST 91 (H) 04/04/2023 1354   ALT 47 (H) 04/04/2023 1354   ALKPHOS 60 04/04/2023 1354   BILITOT 0.7 04/04/2023 1354   BILITOT <0.2 02/11/2023 1114   GFRNONAA >60 04/04/2023 1354   GFRNONAA >89 04/03/2016 1549   GFRAA >60 04/12/2020 0342   GFRAA >89 04/03/2016 1549   Lab Results  Component Value Date   CHOL 261 (H) 02/11/2023   HDL 42 02/11/2023   LDLCALC 164 (H) 02/11/2023   TRIG 291 (H) 02/11/2023   CHOLHDL 6.2 (H) 02/11/2023   Lab Results  Component Value Date   HGBA1C 6.0 (H) 06/02/2022   No results found for: "VITAMINB12" Lab Results  Component Value Date   TSH 1.689 04/10/2021    04/04/23 CTA head / neck ([I reviewed images myself and agree with interpretation. -VRP]  1. No large vessel occlusion. 2. Severe left P2 PCA stenosis.  04/04/23 MRI brain - Normal brain MRI.  No acute infarct.   09/29/21 TTE  1. Left ventricular ejection fraction by 3D volume is 63 %. The left  ventricle has no regional wall motion abnormalities. Left ventricular  diastolic parameters were normal. The average left ventricular global  longitudinal strain is -27.5 %. The global  longitudinal strain is normal.  2. Right ventricular systolic function  is normal. The right ventricular  size is normal. There is normal pulmonary artery systolic pressure.   3. The mitral valve is normal in structure. No evidence of mitral valve  regurgitation.   4. The aortic valve is normal in structure. Aortic valve regurgitation is  not visualized. No aortic stenosis is present.   5. The inferior vena cava is normal in size with greater than 50%  respiratory variability, suggesting right atrial pressure of 3 mmHg.    ASSESSMENT AND PLAN  32 y.o. year old male here with:  Dx:  1. 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 Lie down and place a cool compress on your head.   Avoid headache triggers. If certain foods or odors seem to have triggered your migraines in the past, avoid them. A headache diary might help you identify triggers.   Include physical activity in your daily routine.  Manage stress. Find healthy ways to cope with the stressors, such as delegating tasks on your to-do list.   Practice relaxation techniques. Try deep breathing, yoga, massage and visualization.   Eat regularly. Eating regularly scheduled meals and maintaining a healthy diet might help prevent headaches. Also, drink plenty of fluids.   Follow a regular  sleep schedule. Sleep deprivation might contribute to headaches Consider biofeedback. With this mind-body technique, you learn to control certain bodily functions -- such as muscle tension, heart rate and blood pressure -- to prevent headaches or reduce headache pain.  Return in about 6 months (around 10/21/2023).  I reviewed images, labs, notes, records myself. I summarized findings and reviewed with patient, for this high risk condition (TIA) requiring high complexity decision making.    Suanne Marker, MD 04/20/2023, 11:04 AM Certified in Neurology, Neurophysiology and Neuroimaging  [facility] [address], [address] [place], [place] [address] [phone]

## 2023-04-20 NOTE — Patient Instructions (Addendum)
  TIA (could have been triggered by hypoglycemia, cocaine use; h/o hyperlipidemia, tobacco use) - continue atorvastatin - start aspirin 81mg  daily (consider 6-12 months treatment; then repeat CTA head to follow up left P2 stenosis)  TENSION HEADACHES  - stay hydrated; optimize nutrition, sleep and stress mgmt; avoid alcohol, tobacco, cocaine - may use ibuprofen as needed for headaches (later in day, not same time as aspirin; caution with tylenol due to liver function test abnormalities; needs PCP follow up) - To prevent or relieve headaches, try the following: Cool Compress. Lie down and place a cool compress on your head.   Avoid headache triggers. If certain foods or odors seem to have triggered your migraines in the past, avoid them. A headache diary might help you identify triggers.   Include physical activity in your daily routine.  Manage stress. Find healthy ways to cope with the stressors, such as delegating tasks on your to-do list.   Practice relaxation techniques. Try deep breathing, yoga, massage and visualization.   Eat regularly. Eating regularly scheduled meals and maintaining a healthy diet might help prevent headaches. Also, drink plenty of fluids.   Follow a regular sleep schedule. Sleep deprivation might contribute to headaches Consider biofeedback. With this mind-body technique, you learn to control certain bodily functions -- such as muscle tension, heart rate and blood pressure -- to prevent headaches or reduce headache pain.

## 2023-04-21 ENCOUNTER — Ambulatory Visit: Payer: BC Managed Care – PPO

## 2023-05-25 ENCOUNTER — Ambulatory Visit (INDEPENDENT_AMBULATORY_CARE_PROVIDER_SITE_OTHER): Payer: BC Managed Care – PPO | Admitting: Family Medicine

## 2023-05-25 DIAGNOSIS — E785 Hyperlipidemia, unspecified: Secondary | ICD-10-CM

## 2023-05-25 DIAGNOSIS — R109 Unspecified abdominal pain: Secondary | ICD-10-CM

## 2023-05-26 ENCOUNTER — Other Ambulatory Visit: Payer: Self-pay | Admitting: Family Medicine

## 2023-05-26 DIAGNOSIS — E785 Hyperlipidemia, unspecified: Secondary | ICD-10-CM

## 2023-05-26 LAB — LIPID PANEL
Chol/HDL Ratio: 4.8 ratio (ref 0.0–5.0)
Cholesterol, Total: 218 mg/dL — ABNORMAL HIGH (ref 100–199)
HDL: 45 mg/dL (ref >39)
LDL Chol Calc (NIH): 149 mg/dL — ABNORMAL HIGH (ref 0–99)
Triglycerides: 132 mg/dL (ref 0–149)
VLDL Cholesterol Cal: 24 mg/dL (ref 5–40)

## 2023-05-26 LAB — BASIC METABOLIC PANEL
BUN/Creatinine Ratio: 18 (ref 9–20)
BUN: 18 mg/dL (ref 6–20)
CO2: 22 mmol/L (ref 20–29)
Calcium: 9.6 mg/dL (ref 8.7–10.2)
Chloride: 103 mmol/L (ref 96–106)
Creatinine, Ser: 1.01 mg/dL (ref 0.76–1.27)
Glucose: 101 mg/dL — ABNORMAL HIGH (ref 70–99)
Potassium: 4.5 mmol/L (ref 3.5–5.2)
Sodium: 139 mmol/L (ref 134–144)
eGFR: 103 mL/min/{1.73_m2} (ref >59)

## 2023-05-26 NOTE — Telephone Encounter (Signed)
Requested Prescriptions  Pending Prescriptions Disp Refills   atorvastatin (LIPITOR) 20 MG tablet [Pharmacy Med Name: Atorvastatin Calcium 20 MG Oral Tablet] 90 tablet 0    Sig: Take 1 tablet by mouth once daily     Cardiovascular:  Antilipid - Statins Failed - 05/26/2023  6:31 AM      Failed - Lipid Panel in normal range within the last 12 months    Cholesterol, Total  Date Value Ref Range Status  05/25/2023 218 (H) 100 - 199 mg/dL Final   LDL Chol Calc (NIH)  Date Value Ref Range Status  05/25/2023 149 (H) 0 - 99 mg/dL Final   HDL  Date Value Ref Range Status  05/25/2023 45 >39 mg/dL Final   Triglycerides  Date Value Ref Range Status  05/25/2023 132 0 - 149 mg/dL Final         Passed - Patient is not pregnant      Passed - Valid encounter within last 12 months    Recent Outpatient Visits           Yesterday Left flank pain   [facility]   1 month ago Anterior chest wall pain   [facility], MD   3 months ago Annual physical exam   [facility], MD   11 months ago Annual physical exam   [facility], [place], NP   1 year ago Encounter to establish care   [facility], Salomon Fick, NP

## 2023-05-30 ENCOUNTER — Ambulatory Visit: Payer: BC Managed Care – PPO

## 2023-05-30 ENCOUNTER — Other Ambulatory Visit: Payer: Self-pay

## 2023-05-30 ENCOUNTER — Ambulatory Visit (HOSPITAL_COMMUNITY)
Admission: EM | Admit: 2023-05-30 | Discharge: 2023-05-30 | Disposition: A | Discharge status: Home / Self Care | Payer: BC Managed Care – PPO | Attending: Family Medicine | Admitting: Family Medicine

## 2023-05-30 ENCOUNTER — Encounter (HOSPITAL_COMMUNITY): Payer: Self-pay | Admitting: Emergency Medicine

## 2023-05-30 DIAGNOSIS — R0789 Other chest pain: Secondary | ICD-10-CM | POA: Diagnosis not present

## 2023-05-30 MED ORDER — KETOROLAC TROMETHAMINE 30 MG/ML IJ SOLN
30.0000 mg | Freq: Once | INTRAMUSCULAR | Status: AC
  Administered 2023-05-30: 30 mg via INTRAMUSCULAR

## 2023-05-30 MED ORDER — IBUPROFEN 800 MG PO TABS: 800.0000 mg | ORAL_TABLET | Freq: Three times a day (TID) | ORAL | 0 refills | Status: DC | PRN

## 2023-05-30 MED ORDER — KETOROLAC TROMETHAMINE 30 MG/ML IJ SOLN
INTRAMUSCULAR | Status: AC
  Filled 2023-05-30: qty 1

## 2023-05-30 NOTE — Discharge Instructions (Addendum)
You have been given a shot of Toradol 30 mg today.  Take ibuprofen 800 mg--1 tab every 8 hours as needed for pain.   A heating pad can also help

## 2023-05-30 NOTE — ED Provider Notes (Signed)
[facility]    CSN: [id number] Arrival date & time: 05/30/23  1844      History   Chief Complaint Chief Complaint  Patient presents with   Abdominal Pain    HPI Victor Burns is a 32 y.o. male.    Abdominal Pain  Here for pain in his right side.  Is been bothering him for about a week.  No trauma or fall.  No fever or cough.  No rash.  Hurts more when he twists his torso or if he takes a deep breath to shout.  No blood in his urine.    Past Medical History:  Diagnosis Date   Abscess    Anxiety    Arthritis    knee, back   Asthma    Elevated cholesterol    Kidney stones     Patient Active Problem List   Diagnosis Date Noted   Prediabetes 06/03/2022   Hyperlipidemia 06/03/2022   GERD (gastroesophageal reflux disease) 11/24/2021   Perirectal abscess s/p I&D 06/16/2018 06/16/2018   Alcohol abuse 06/16/2018   Mild persistent asthma 10/07/2017    Past Surgical History:  Procedure Laterality Date   EVALUATION UNDER ANESTHESIA WITH HEMORRHOIDECTOMY N/A 08/08/2019   Procedure: EXAM UNDER ANESTHESIA WITH IRRIGATION AND DRAINAGE OF PERIRECTAL ABSCESS;  Surgeon: Andria Meuse, MD;  Location: [facility];  Service: General;  Laterality: N/A;   FISTULOTOMY N/A 02/20/2020   Procedure: PARTIAL  FISTULOTOMY WITH PLACEMENT OF CUTTING TETON;  Surgeon: Andria Meuse, MD;  Location: [facility];  Service: General;  Laterality: N/A;   INCISION AND DRAINAGE PERIRECTAL ABSCESS N/A 06/16/2018   Procedure: Francia Greaves UNDER ANESTHESIA IRRIGATION AND DEBRIDEMENT PERIRECTAL ABSCESS;  Surgeon: Emelia Loron, MD;  Location: [facility];  Service: General;  Laterality: N/A;   KNEE ARTHROSCOPY     PLACEMENT OF SETON  08/08/2019   Procedure: PLACEMENT OF DRAINING SETON;  Surgeon: Andria Meuse, MD;  Location: [facility];  Service: General;;   RECTAL EXAM UNDER ANESTHESIA N/A 02/20/2020   Procedure: ANORECTAL EXAM UNDER ANESTHESIA;  Surgeon: Andria Meuse, MD;  Location: [facility];  Service: General;  Laterality: N/A;       Home Medications    Prior to Admission medications   Medication Sig Start Date End Date Taking? Authorizing Provider  NON FORMULARY "Milk decile"   Yes [provider]  acetaminophen (TYLENOL) 500 MG tablet Take 500 mg by mouth every 6 (six) hours as needed.    [provider]  albuterol (PROVENTIL) (2.5 MG/3ML) 0.083% nebulizer solution Take 3 mLs (2.5 mg total) by nebulization every 4 (four) hours as needed for wheezing or shortness of breath. 11/11/22 11/11/23  Elson Areas, PA-C  albuterol (VENTOLIN HFA) 108 (90 Base) MCG/ACT inhaler Inhale 2 puffs into the lungs every 6 (six) hours as needed for wheezing or shortness of breath. 02/11/23   Georganna Skeans, MD  atorvastatin (LIPITOR) 20 MG tablet Take 1 tablet by mouth once daily 05/26/23   Georganna Skeans, MD  calcium carbonate (TUMS - DOSED IN MG ELEMENTAL CALCIUM) 500 MG chewable tablet Chew 2 tablets by mouth daily. As needed.    [provider]  nicotine polacrilex (NICORETTE) 4 MG gum Take 1 each (4 mg total) by mouth as needed for smoking cessation. 04/06/23   Freddy Finner, NP  Omega-3 Fatty Acids (FISH OIL) 500 MG CAPS Take 1,000 mg by mouth daily.    [provider]    Family History Family History  Problem Relation Age of Onset   Hypertension Mother    Hypertension Father     Social History Social History   Tobacco Use   Smoking status: Some Days    Packs/day: 0.25    Years: 10.00    Additional pack years: 0.00    Total pack years: 2.50    Types: Cigarettes    Passive exposure: Current   Smokeless tobacco: Never   Tobacco comments:    Smokes on weekends   Vaping Use   Vaping Use: Never used  Substance Use Topics   Alcohol use: Not Currently    Comment: 2 bottles liquor/ per wk   Drug use: Yes    Frequency: 3.0 times per week    Types: Marijuana    Comment: Pt reports smoking every other day (2g)     Allergies   Bee  venom, Shellfish allergy, Compazine [prochlorperazine], and Other   Review of Systems Review of Systems  Gastrointestinal:  Positive for abdominal pain.     Physical Exam Triage Vital Signs ED Triage Vitals  Enc Vitals Group     BP 05/30/23 1922 137/87     Pulse Rate 05/30/23 1922 64     Resp 05/30/23 1922 18     Temp 05/30/23 1922 98.1 F (36.7 C)     Temp Source 05/30/23 1922 Oral     SpO2 05/30/23 1922 97 %     Weight --      Height --      Head Circumference --      Peak Flow --      Pain Score 05/30/23 1919 6     Pain Loc --      Pain Edu? --      Excl. in GC? --    No data found.  Updated Vital Signs BP 137/87 (BP Location: Left Arm) Comment (BP Location): large cuff  Pulse 64   Temp 98.1 F (36.7 C) (Oral)   Resp 18   SpO2 97%   Visual Acuity Right Eye Distance:   Left Eye Distance:   Bilateral Distance:    Right Eye Near:   Left Eye Near:    Bilateral Near:     Physical Exam Vitals reviewed.  Constitutional:      General: He is not in acute distress.    Appearance: He is not ill-appearing, toxic-appearing or diaphoretic.  HENT:     Mouth/Throat:     Mouth: Mucous membranes are moist.  Eyes:     Extraocular Movements: Extraocular movements intact.     Pupils: Pupils are equal, round, and reactive to light.  Cardiovascular:     Rate and Rhythm: Normal rate and regular rhythm.     Heart sounds: No murmur heard. Pulmonary:     Effort: Pulmonary effort is normal. No respiratory distress.     Breath sounds: Normal breath sounds. No stridor. No wheezing, rhonchi or rales.  Chest:     Chest wall: No tenderness.  Skin:    Coloration: Skin is not pale.  Neurological:     Mental Status: He is alert and oriented to person, place, and time.  Psychiatric:        Behavior: Behavior normal.      UC Treatments / Results  Labs (all labs ordered are listed, but only abnormal results are displayed) Labs Reviewed - No data to  display  EKG   Radiology No results found.  Procedures Procedures (including critical care time)  Medications Ordered in  UC Medications - No data to display  Initial Impression / Assessment and Plan / UC Course  I have reviewed the triage vital signs and the nursing notes.  Pertinent labs & imaging results that were available during my care of the patient were reviewed by me and considered in my medical decision making (see chart for details).     I do think this is most likely musculoskeletal in origin.  Toradol is given here and ibuprofen is sent to the pharmacy. He did express some concern that this might be pancreatitis or appendicitis.  Of note, he has not had any nausea or vomiting or constipation or diarrhea.  There is no abdominal pain.    Final Clinical Impressions(s) / UC Diagnoses   Final diagnoses:  None   Discharge Instructions   None    ED Prescriptions   None    I have reviewed the PDMP during this encounter.   Zenia Resides, MD 05/30/23 [phone]

## 2023-05-30 NOTE — ED Triage Notes (Signed)
Right side of torso is painful.  Pain noticed pain one week ago.  Has not had any medicine for pain.

## 2023-08-17 ENCOUNTER — Encounter: Payer: Self-pay | Admitting: Family

## 2023-08-17 ENCOUNTER — Ambulatory Visit (INDEPENDENT_AMBULATORY_CARE_PROVIDER_SITE_OTHER): Payer: BC Managed Care – PPO | Admitting: Family

## 2023-08-17 VITALS — BP 132/84 | HR 57 | Temp 98.3°F | Ht 71.0 in | Wt 262.2 lb

## 2023-08-17 DIAGNOSIS — G43909 Migraine, unspecified, not intractable, without status migrainosus: Secondary | ICD-10-CM

## 2023-08-17 DIAGNOSIS — R110 Nausea: Secondary | ICD-10-CM | POA: Diagnosis not present

## 2023-08-17 DIAGNOSIS — K219 Gastro-esophageal reflux disease without esophagitis: Secondary | ICD-10-CM

## 2023-08-17 DIAGNOSIS — R1030 Lower abdominal pain, unspecified: Secondary | ICD-10-CM

## 2023-08-17 LAB — POCT URINALYSIS DIP (CLINITEK)
Bilirubin, UA: NEGATIVE
Blood, UA: NEGATIVE
Glucose, UA: NEGATIVE mg/dL
Ketones, POC UA: NEGATIVE mg/dL
Leukocytes, UA: NEGATIVE
Nitrite, UA: NEGATIVE
POC PROTEIN,UA: NEGATIVE
Spec Grav, UA: 1.025 (ref 1.010–1.025)
Urobilinogen, UA: 0.2 U/dL
pH, UA: 6 (ref 5.0–8.0)

## 2023-08-17 MED ORDER — OMEPRAZOLE 20 MG PO CPDR: 20.0000 mg | DELAYED_RELEASE_CAPSULE | Freq: Every day | ORAL | 0 refills | Status: DC

## 2023-08-17 MED ORDER — ONDANSETRON 4 MG PO TBDP
4.0000 mg | ORAL_TABLET | Freq: Three times a day (TID) | ORAL | 1 refills | Status: DC | PRN
Start: 2023-08-17 — End: 2024-02-07

## 2023-08-17 MED ORDER — SUMATRIPTAN SUCCINATE 50 MG PO TABS
ORAL_TABLET | ORAL | 1 refills | Status: DC
Start: 2023-08-17 — End: 2024-02-07

## 2023-08-17 NOTE — Progress Notes (Signed)
Patient ID: Victor Burns, male    DOB: [birth year]  MRN: [id number]  CC: Headaches/Abdominal Pain  Subjective: Victor Burns is a 32 y.o. male who presents for headaches/abdominal pain.  His concerns today include:  - Intermittent headaches began 3 days ago. Located frontal and bilateral sides of head. He denies associated red flag symptoms. Taking over-the-counter Tylenol with minimal relief. Reports shortly after headaches began he develops lower abdominal pain and nausea. Reports he has been checking his blood pressure since being sick and has been elevated. He denies red flag symptoms associated with high blood pressure. States he began to feel slightly improved on last night.  - Acid reflux especially caused by spicy foods. He denies red flag symptoms. Taking over-the-counter Tums with minima relief.  - No further issues/concerns for discussion today.   Patient Active Problem List   Diagnosis Date Noted   Prediabetes 06/03/2022   Hyperlipidemia 06/03/2022   GERD (gastroesophageal reflux disease) 11/24/2021   Perirectal abscess s/p I&D 06/16/2018 06/16/2018   Alcohol abuse 06/16/2018   Mild persistent asthma 10/07/2017     Current Outpatient Medications on File Prior to Visit  Medication Sig Dispense Refill   acetaminophen (TYLENOL) 500 MG tablet Take 500 mg by mouth every 6 (six) hours as needed.     albuterol (PROVENTIL) (2.5 MG/3ML) 0.083% nebulizer solution Take 3 mLs (2.5 mg total) by nebulization every 4 (four) hours as needed for wheezing or shortness of breath. 75 mL 2   albuterol (VENTOLIN HFA) 108 (90 Base) MCG/ACT inhaler Inhale 2 puffs into the lungs every 6 (six) hours as needed for wheezing or shortness of breath. 8 g 2   atorvastatin (LIPITOR) 20 MG tablet Take 1 tablet by mouth once daily 90 tablet 1   calcium carbonate (TUMS - DOSED IN MG ELEMENTAL CALCIUM) 500 MG chewable tablet Chew 2 tablets by mouth daily. As needed.     nicotine polacrilex (NICORETTE) 4 MG  gum Take 1 each (4 mg total) by mouth as needed for smoking cessation. 100 tablet 0   NON FORMULARY "Milk decile"     Omega-3 Fatty Acids (FISH OIL) 500 MG CAPS Take 1,000 mg by mouth daily.     No current facility-administered medications on file prior to visit.    Allergies  Allergen Reactions   Bee Venom Anaphylaxis   Shellfish Allergy Anaphylaxis    Pt says not allergic anymore   Compazine [Prochlorperazine] Other (See Comments)    Trembling, feeling hot.    Other Itching    CHG wipes    Social History   Socioeconomic History   Marital status: Single    Spouse name: Not on file   Number of children: Not on file   Years of education: Not on file   Highest education level: Not on file  Occupational History   Occupation: Research scientist (medical) [employer]  Tobacco Use   Smoking status: Some Days    Current packs/day: 0.25    Average packs/day: 0.3 packs/day for 10.0 years (2.5 ttl pk-yrs)    Types: Cigarettes    Passive exposure: Current   Smokeless tobacco: Never   Tobacco comments:    Smokes on weekends   Vaping Use   Vaping status: Never Used  Substance and Sexual Activity   Alcohol use: Not Currently    Comment: 2 bottles liquor/ per wk   Drug use: Yes    Frequency: 3.0 times per week    Types: Marijuana    Comment: Pt reports smoking  every other day (2g)   Sexual activity: Not on file  Other Topics Concern   Not on file  Social History Narrative   ** Merged History Encounter **       Social Determinants of Health   Financial Resource Strain: Not on file  Food Insecurity: No Food Insecurity (02/11/2023)   Hunger Vital Sign    Worried About Running Out of Food in the Last Year: Never true    Ran Out of Food in the Last Year: Never true  Transportation Needs: Not on file  Physical Activity: Not on file  Stress: Not on file  Social Connections: Not on file  Intimate Partner Violence: Not At Risk (02/11/2023)   Humiliation, Afraid, Rape, and Kick questionnaire    Fear of  Current or Ex-Partner: No    Emotionally Abused: No    Physically Abused: No    Sexually Abused: No    Family History  Problem Relation Age of Onset   Hypertension Mother    Hypertension Father     Past Surgical History:  Procedure Laterality Date   EVALUATION UNDER ANESTHESIA WITH HEMORRHOIDECTOMY N/A 08/08/2019   Procedure: EXAM UNDER ANESTHESIA WITH IRRIGATION AND DRAINAGE OF PERIRECTAL ABSCESS;  Surgeon: Andria Meuse, MD;  Location: [facility];  Service: General;  Laterality: N/A;   FISTULOTOMY N/A 02/20/2020   Procedure: PARTIAL  FISTULOTOMY WITH PLACEMENT OF CUTTING TETON;  Surgeon: Andria Meuse, MD;  Location: [facility];  Service: General;  Laterality: N/A;   INCISION AND DRAINAGE PERIRECTAL ABSCESS N/A 06/16/2018   Procedure: Francia Greaves UNDER ANESTHESIA IRRIGATION AND DEBRIDEMENT PERIRECTAL ABSCESS;  Surgeon: Emelia Loron, MD;  Location: [facility];  Service: General;  Laterality: N/A;   KNEE ARTHROSCOPY     PLACEMENT OF SETON  08/08/2019   Procedure: PLACEMENT OF DRAINING SETON;  Surgeon: Andria Meuse, MD;  Location: [facility];  Service: General;;   RECTAL EXAM UNDER ANESTHESIA N/A 02/20/2020   Procedure: ANORECTAL EXAM UNDER ANESTHESIA;  Surgeon: Andria Meuse, MD;  Location: [facility];  Service: General;  Laterality: N/A;    ROS: Review of Systems Negative except as stated above  PHYSICAL EXAM: BP 132/84   Pulse (!) 57   Temp 98.3 F (36.8 C) (Oral)   Ht 5\' 11"  (1.803 m)   Wt 262 lb 3.2 oz (118.9 kg)   SpO2 97%   BMI 36.57 kg/m   Physical Exam HENT:     Head: Normocephalic and atraumatic.     Right Ear: Tympanic membrane, ear canal and external ear normal.     Left Ear: Tympanic membrane, ear canal and external ear normal.     Nose: Nose normal.     Mouth/Throat:     Mouth: Mucous membranes are moist.     Pharynx: Oropharynx is clear.  Eyes:     Extraocular Movements: Extraocular movements intact.     Conjunctiva/sclera: Conjunctivae normal.      Pupils: Pupils are equal, round, and reactive to light.  Cardiovascular:     Rate and Rhythm: Bradycardia present.     Pulses: Normal pulses.     Heart sounds: Normal heart sounds.  Pulmonary:     Effort: Pulmonary effort is normal.     Breath sounds: Normal breath sounds.  Abdominal:     General: Bowel sounds are normal.     Palpations: Abdomen is soft.  Musculoskeletal:        General: Normal range of motion.     Cervical back: Normal  range of motion and neck supple.  Neurological:     General: No focal deficit present.     Mental Status: He is alert and oriented to person, place, and time.  Psychiatric:        Mood and Affect: Mood normal.        Behavior: Behavior normal.     ASSESSMENT AND PLAN: 1. Migraine without status migrainosus, not intractable, unspecified migraine type - Sumatriptan as prescribed. Counseled on medication adherence/adverse effects. - Follow-up with primary provider in 4 weeks or sooner if needed.  - SUMAtriptan (IMITREX) 50 MG tablet; Take 50 mg (1 tablet total) by mouth at the start of the headache. May repeat in 2 hours x 1 if headache persists. Max of 2 tablets/24 hours.  Dispense: 30 tablet; Refill: 1  2. Gastroesophageal reflux disease, unspecified whether esophagitis present - Omeprazole as prescribed. Counseled on medication adherence.  - Patient declined referral to Gastroenterology.  - Follow-up with primary provider in 4 weeks or sooner if needed.  - omeprazole (PRILOSEC) 20 MG capsule; Take 1 capsule (20 mg total) by mouth daily.  Dispense: 90 capsule; Refill: 0  3. Lower abdominal pain - Routine screening.  - Patient declined referral to Gastroenterology.  - CMP14+EGFR - CBC - Amylase - Lipase - POCT URINALYSIS DIP (CLINITEK); Future  4. Nausea - Ondansetron as prescribed. Counseled on medication adherence.  - Follow-up with primary provider as scheduled.  - ondansetron (ZOFRAN-ODT) 4 MG disintegrating tablet; Take 1 tablet (4 mg  total) by mouth every 8 (eight) hours as needed for nausea or vomiting.  Dispense: 30 tablet; Refill: 1   Patient was given the opportunity to ask questions.  Patient verbalized understanding of the plan and was able to repeat key elements of the plan. Patient was given clear instructions to go to Emergency Department or return to medical center if symptoms don't improve, worsen, or new problems develop.The patient verbalized understanding.   Orders Placed This Encounter  Procedures   CMP14+EGFR   CBC   Amylase   Lipase   POCT URINALYSIS DIP (CLINITEK)     Requested Prescriptions   Signed Prescriptions Disp Refills   SUMAtriptan (IMITREX) 50 MG tablet 30 tablet 1    Sig: Take 50 mg (1 tablet total) by mouth at the start of the headache. May repeat in 2 hours x 1 if headache persists. Max of 2 tablets/24 hours.   ondansetron (ZOFRAN-ODT) 4 MG disintegrating tablet 30 tablet 1    Sig: Take 1 tablet (4 mg total) by mouth every 8 (eight) hours as needed for nausea or vomiting.   omeprazole (PRILOSEC) 20 MG capsule 90 capsule 0    Sig: Take 1 capsule (20 mg total) by mouth daily.    Return in about 4 weeks (around 09/14/2023) for Follow-Up or next available acid reflux Georganna Skeans, MD.  Rema Fendt, NP

## 2023-08-17 NOTE — Progress Notes (Signed)
Pt states abdominal pain with headaches. States when he lays down he gets dizzy and throughout the day is nauseous.

## 2023-08-18 LAB — CMP14+EGFR
ALT: 39 IU/L (ref 0–44)
AST: 36 IU/L (ref 0–40)
Albumin: 4.5 g/dL (ref 4.3–5.2)
Alkaline Phosphatase: 70 IU/L (ref 44–121)
BUN/Creatinine Ratio: 15 (ref 9–20)
BUN: 13 mg/dL (ref 6–20)
Bilirubin Total: 0.3 mg/dL (ref 0.0–1.2)
CO2: 20 mmol/L (ref 20–29)
Calcium: 9 mg/dL (ref 8.7–10.2)
Chloride: 102 mmol/L (ref 96–106)
Creatinine, Ser: 0.85 mg/dL (ref 0.76–1.27)
Globulin, Total: 2.5 g/dL (ref 1.5–4.5)
Glucose: 107 mg/dL — ABNORMAL HIGH (ref 70–99)
Potassium: 4.3 mmol/L (ref 3.5–5.2)
Sodium: 139 mmol/L (ref 134–144)
Total Protein: 7 g/dL (ref 6.0–8.5)
eGFR: 120 mL/min/{1.73_m2} (ref >59)

## 2023-08-18 LAB — CBC
Hematocrit: 39.2 % (ref 37.5–51.0)
Hemoglobin: 13.1 g/dL (ref 13.0–17.7)
MCH: 30.1 pg (ref 26.6–33.0)
MCHC: 33.4 g/dL (ref 31.5–35.7)
MCV: 90 fL (ref 79–97)
Platelets: 311 10*3/uL (ref 150–450)
RBC: 4.35 x10E6/uL (ref 4.14–5.80)
RDW: 13.3 % (ref 11.6–15.4)
WBC: 3.2 10*3/uL — ABNORMAL LOW (ref 3.4–10.8)

## 2023-08-18 LAB — LIPASE: Lipase: 32 U/L (ref 13–78)

## 2023-08-18 LAB — AMYLASE: Amylase: 53 U/L (ref 31–110)

## 2023-08-23 ENCOUNTER — Telehealth: Payer: Self-pay | Admitting: Family Medicine

## 2023-08-23 NOTE — Telephone Encounter (Signed)
Patient is aware of pending lab result

## 2023-08-23 NOTE — Telephone Encounter (Signed)
Copied from CRM [id number]. Topic: General - Other >> Aug 23, 2023  9:10 AM Macon Large wrote: Reason for CRM: Pt called for most recent lab results. Pt requests call back. Cb# [phone]

## 2023-08-26 ENCOUNTER — Telehealth: Payer: Self-pay | Admitting: Gastroenterology

## 2023-08-26 NOTE — Telephone Encounter (Signed)
Received MyChart message from patient stating he has been having excessive dark and bright red blood in his stools.  He has an appointment scheduled with Dr. Tomasa Rand on 12/13, but would like to be seen sooner, if possible.  Would also like to be advised what to do in the interim.  Please call and advise.  Thank you.

## 2023-08-26 NOTE — Telephone Encounter (Signed)
Spoke with pt and he states for the past few days he has been seeing a lot of blood when he has a BM. Reports it is on the tissue paper and colors the toilet water. Pt scdheduled to see Boone Master PA 08/29/23 at 9:30am. Pt aware of appt.

## 2023-08-29 ENCOUNTER — Encounter: Payer: Self-pay | Admitting: Gastroenterology

## 2023-08-29 ENCOUNTER — Ambulatory Visit (INDEPENDENT_AMBULATORY_CARE_PROVIDER_SITE_OTHER): Payer: BC Managed Care – PPO | Admitting: Gastroenterology

## 2023-08-29 VITALS — BP 140/88 | HR 86 | Ht 71.0 in | Wt 236.0 lb

## 2023-08-29 DIAGNOSIS — R1031 Right lower quadrant pain: Secondary | ICD-10-CM | POA: Diagnosis not present

## 2023-08-29 DIAGNOSIS — K625 Hemorrhage of anus and rectum: Secondary | ICD-10-CM | POA: Diagnosis not present

## 2023-08-29 DIAGNOSIS — K5900 Constipation, unspecified: Secondary | ICD-10-CM

## 2023-08-29 DIAGNOSIS — Z8719 Personal history of other diseases of the digestive system: Secondary | ICD-10-CM | POA: Diagnosis not present

## 2023-08-29 MED ORDER — HYDROCORTISONE ACETATE 25 MG RE SUPP: 25.0000 mg | Freq: Two times a day (BID) | RECTAL | 0 refills | Status: AC

## 2023-08-29 MED ORDER — NA SULFATE-K SULFATE-MG SULF 17.5-3.13-1.6 GM/177ML PO SOLN: ORAL | 0 refills | Status: DC

## 2023-08-29 NOTE — Patient Instructions (Addendum)
Start taking Miralax 1 capful (17 grams) 1x / day for 1 week.   If this is not effective, increase to 1 dose 2x / day for 1 week.   If this is still not effective, increase to two capfuls (34 grams) 2x / day.   Can adjust dose as needed based on response. Can take 1/2 cap daily, skip days, or increase per day.     You have been scheduled for a colonoscopy. Please follow written instructions given to you at your visit today.   Please pick up your prep supplies at the pharmacy within the next 1-3 days.  If you use inhalers (even only as needed), please bring them with you on the day of your procedure.  DO NOT TAKE 7 DAYS PRIOR TO TEST- Trulicity (dulaglutide) Ozempic, Wegovy (semaglutide) Mounjaro (tirzepatide) Bydureon Bcise (exanatide extended release)  DO NOT TAKE 1 DAY PRIOR TO YOUR TEST Rybelsus (semaglutide) Adlyxin (lixisenatide) Victoza (liraglutide) Byetta (exanatide)  We have sent the following medications to your pharmacy for you to pick up at your convenience: Suprep, Hydrocortisone  suppository   If your blood pressure at your visit was 140/90 or greater, please contact your primary care physician to follow up on this.  _______________________________________________________  If you are age 31 or older, your body mass index should be between 23-30. Your Body mass index is 32.92 kg/m. If this is out of the aforementioned range listed, please consider follow up with your Primary Care Provider.  If you are age 31 or younger, your body mass index should be between 19-25. Your Body mass index is 32.92 kg/m. If this is out of the aformentioned range listed, please consider follow up with your Primary Care Provider.   ________________________________________________________  The [facility] providers would like to encourage you to use [facility] to communicate with providers for non-urgent requests or questions.  Due to long hold times on the telephone, sending your provider a  message by [facility] may be a faster and more efficient way to get a response.  Please allow 48 business hours for a response.  Please remember that this is for non-urgent requests.  _______________________________________________________   Due to recent changes in healthcare laws, you may see the results of your imaging and laboratory studies on MyChart before your provider has had a chance to review them.  We understand that in some cases there may be results that are confusing or concerning to you. Not all laboratory results come back in the same time frame and the provider may be waiting for multiple results in order to interpret others.  Please give [place] 48 hours in order for your provider to thoroughly review all the results before contacting the office for clarification of your results.    Thank you for entrusting me with your care and choosing [facility].  Bayley McMichael PA-C

## 2023-08-29 NOTE — Progress Notes (Signed)
Chief Complaint: Rectal bleeding Primary GI MD: Dr. 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  Last seen 09/2022 by Dr. Tomasa Rand. At that time patient was having abdominal pain consistent with irritable bowel syndrome.  He was given Bentyl and recommended to take Metamucil daily.  Fecal calprotectin and H. pylori stool antigen were ordered but not completed.  labs 08/17/2023 including CBC, CMP, lipase were all negative.  Patient states he is having rectal bleeding that is becoming progressively more frequent. Notes history of constipation in which he has 1-2 Bms per week and often has to strain. States he has see blood per rectum before but not to this extent.  Patient states he has rectal bleeding with the majority of his bowel movements now.  States he will wipe and his entire tissue papers filled with blood and the toilet will also be filled with bright red blood.  Denies rectal pain.  States he noticed the bleeding is worse after a night of drinking alcohol.  Notes RLQ pain that hurts worse if he ignores the call to stool.  Denies nausea/vomiting.  Denies weight loss.  Notes family history of colon cancer in maternal grandfather (unknown age).  Patient declines rectal exam today and would prefer to be evaluated during the colonoscopy.  Past Medical History:  Diagnosis Date   Abscess    Anxiety    Arthritis    knee, back   Asthma    Elevated cholesterol    Kidney stones     Past Surgical History:  Procedure Laterality Date   EVALUATION UNDER ANESTHESIA WITH HEMORRHOIDECTOMY N/A 08/08/2019   Procedure: EXAM UNDER ANESTHESIA WITH IRRIGATION AND DRAINAGE OF PERIRECTAL ABSCESS;  Surgeon: Andria Meuse, MD;  Location: [facility];  Service: General;  Laterality: N/A;   FISTULOTOMY N/A 02/20/2020   Procedure: PARTIAL  FISTULOTOMY WITH PLACEMENT OF  CUTTING TETON;  Surgeon: Andria Meuse, MD;  Location: [facility];  Service: General;  Laterality: N/A;   INCISION AND DRAINAGE PERIRECTAL ABSCESS N/A 06/16/2018   Procedure: Francia Greaves UNDER ANESTHESIA IRRIGATION AND DEBRIDEMENT PERIRECTAL ABSCESS;  Surgeon: Emelia Loron, MD;  Location: [facility];  Service: General;  Laterality: N/A;   KNEE ARTHROSCOPY     PLACEMENT OF SETON  08/08/2019   Procedure: PLACEMENT OF DRAINING SETON;  Surgeon: Andria Meuse, MD;  Location: [facility];  Service: General;;   RECTAL EXAM UNDER ANESTHESIA N/A 02/20/2020   Procedure: ANORECTAL EXAM UNDER ANESTHESIA;  Surgeon: Andria Meuse, MD;  Location: [facility];  Service: General;  Laterality: N/A;    Current Outpatient Medications  Medication Sig Dispense Refill   acetaminophen (TYLENOL) 500 MG tablet Take 500 mg by mouth every 6 (six) hours as needed.     albuterol (PROVENTIL) (2.5 MG/3ML) 0.083% nebulizer solution Take 3 mLs (2.5 mg total) by nebulization every 4 (four) hours as needed for wheezing or shortness of breath. 75 mL 2   albuterol (VENTOLIN HFA) 108 (90 Base) MCG/ACT inhaler Inhale 2 puffs into the lungs every 6 (six) hours as needed for wheezing or shortness of breath. 8 g 2   atorvastatin (LIPITOR) 20 MG tablet Take 1 tablet by mouth once daily 90 tablet 1   calcium carbonate (TUMS - DOSED IN MG ELEMENTAL CALCIUM) 500 MG chewable tablet Chew 2 tablets by mouth daily. As needed.     nicotine polacrilex (NICORETTE) 4 MG gum Take 1  each (4 mg total) by mouth as needed for smoking cessation. 100 tablet 0   NON FORMULARY "Milk decile"     Omega-3 Fatty Acids (FISH OIL) 500 MG CAPS Take 1,000 mg by mouth daily.     omeprazole (PRILOSEC) 20 MG capsule Take 1 capsule (20 mg total) by mouth daily. 90 capsule 0   ondansetron (ZOFRAN-ODT) 4 MG disintegrating tablet Take 1 tablet (4 mg total) by mouth every 8 (eight) hours as needed for nausea or vomiting. 30 tablet 1   SUMAtriptan (IMITREX) 50 MG tablet Take  50 mg (1 tablet total) by mouth at the start of the headache. May repeat in 2 hours x 1 if headache persists. Max of 2 tablets/24 hours. 30 tablet 1   No current facility-administered medications for this visit.    Allergies as of 08/29/2023 - Review Complete 08/17/2023  Allergen Reaction Noted   Bee venom Anaphylaxis 05/30/2016   Shellfish allergy Anaphylaxis 05/30/2016   Compazine [prochlorperazine] Other (See Comments) 04/10/2021   Other Itching 08/08/2019    Family History  Problem Relation Age of Onset   Hypertension Mother    Hypertension Father     Social History   Socioeconomic History   Marital status: Single    Spouse name: Not on file   Number of children: Not on file   Years of education: Not on file   Highest education level: Not on file  Occupational History   Occupation: Research scientist (medical) [employer]  Tobacco Use   Smoking status: Some Days    Current packs/day: 0.25    Average packs/day: 0.3 packs/day for 10.0 years (2.5 ttl pk-yrs)    Types: Cigarettes    Passive exposure: Current   Smokeless tobacco: Never   Tobacco comments:    Smokes on weekends   Vaping Use   Vaping status: Never Used  Substance and Sexual Activity   Alcohol use: Not Currently    Comment: 2 bottles liquor/ per wk   Drug use: Yes    Frequency: 3.0 times per week    Types: Marijuana    Comment: Pt reports smoking every other day (2g)   Sexual activity: Not on file  Other Topics Concern   Not on file  Social History Narrative   ** Merged History Encounter **       Social Determinants of Health   Financial Resource Strain: Not on file  Food Insecurity: No Food Insecurity (02/11/2023)   Hunger Vital Sign    Worried About Running Out of Food in the Last Year: Never true    Ran Out of Food in the Last Year: Never true  Transportation Needs: Not on file  Physical Activity: Not on file  Stress: Not on file  Social Connections: Not on file  Intimate Partner Violence: Not At Risk (02/11/2023)    Humiliation, Afraid, Rape, and Kick questionnaire    Fear of Current or Ex-Partner: No    Emotionally Abused: No    Physically Abused: No    Sexually Abused: No    Review of Systems:    Constitutional: No weight loss, fever, chills, weakness or fatigue HEENT: Eyes: No change in vision               Ears, Nose, Throat:  No change in hearing or congestion Skin: No rash or itching Cardiovascular: No chest pain, chest pressure or palpitations   Respiratory: No SOB or cough Gastrointestinal: See HPI and otherwise negative Genitourinary: No dysuria or change in urinary frequency Neurological:  No headache, dizziness or syncope Musculoskeletal: No new muscle or joint pain Hematologic: No bleeding or bruising Psychiatric: No history of depression or anxiety    Physical Exam:  Vital signs: Ht 5\' 11"  (1.803 m)   Wt 107 kg   BMI 32.92 kg/m   Constitutional: NAD, Well developed, Well nourished, alert and cooperative Head:  Normocephalic and atraumatic. Eyes:   PEERL, EOMI. No icterus. Conjunctiva pink. Respiratory: Respirations even and unlabored. Lungs clear to auscultation bilaterally.   No wheezes, crackles, or rhonchi.  Cardiovascular:  Regular rate and rhythm. No peripheral edema, cyanosis or pallor.  Gastrointestinal:  Soft, nondistended, nontender. No rebound or guarding. Normal bowel sounds. No appreciable masses or hepatomegaly. Rectal: Declined by patient Msk:  Symmetrical without gross deformities. Without edema, no deformity or joint abnormality.  Neurologic:  Alert and  oriented x4;  grossly normal neurologically.  Skin:   Dry and intact without significant lesions or rashes. Psychiatric: Oriented to person, place and time. Demonstrates good judgement and reason without abnormal affect or behaviors.   RELEVANT LABS AND IMAGING: CBC    Component Value Date/Time   WBC 3.2 (L) 08/17/2023 1019   WBC 4.8 04/04/2023 1354   RBC 4.35 08/17/2023 1019   RBC 4.27 04/04/2023  1354   HGB 13.1 08/17/2023 1019   HCT 39.2 08/17/2023 1019   PLT 311 08/17/2023 1019   MCV 90 08/17/2023 1019   MCH 30.1 08/17/2023 1019   MCH 29.7 04/04/2023 1354   MCHC 33.4 08/17/2023 1019   MCHC 33.5 04/04/2023 1354   RDW 13.3 08/17/2023 1019   LYMPHSABS 1.3 04/04/2023 1354   LYMPHSABS 1.7 02/11/2023 1114   MONOABS 0.4 04/04/2023 1354   EOSABS 0.0 04/04/2023 1354   EOSABS 0.0 02/11/2023 1114   BASOSABS 0.0 04/04/2023 1354   BASOSABS 0.0 02/11/2023 1114    CMP     Component Value Date/Time   NA 139 08/17/2023 1019   K 4.3 08/17/2023 1019   CL 102 08/17/2023 1019   CO2 20 08/17/2023 1019   GLUCOSE 107 (H) 08/17/2023 1019   GLUCOSE 79 04/04/2023 1404   BUN 13 08/17/2023 1019   CREATININE 0.85 08/17/2023 1019   CREATININE 1.01 04/03/2016 1549   CALCIUM 9.0 08/17/2023 1019   PROT 7.0 08/17/2023 1019   ALBUMIN 4.5 08/17/2023 1019   AST 36 08/17/2023 1019   ALT 39 08/17/2023 1019   ALKPHOS 70 08/17/2023 1019   BILITOT 0.3 08/17/2023 1019   GFRNONAA >60 04/04/2023 1354   GFRNONAA >89 04/03/2016 1549   GFRAA >60 04/12/2020 0342   GFRAA >89 04/03/2016 1549     Assessment/Plan:   32 year old male 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  Declined colonoscopy and has not had previous fecal calprotectin/H. pylori stool antigen as ordered in 2023 by Dr. Tomasa Rand.  Rectal bleeding Suspect rectal bleeding is due to internal hemorrhoids since no pain and history of constipation with straining.  However, due to history of perianal abscesses/fistulas we cannot rule out IBD although less likely.  - Colonoscopy for further evaluation - Hydrocortisone suppositories twice daily for 7 days - Discussed the importance of controlling constipation in attempt to prevent worsening of suspected hemorrhoids - I thoroughly discussed the procedure with the patient (at bedside) to  include nature of the procedure, alternatives, benefits, and risks (including but not limited to bleeding, infection, perforation, anesthesia/cardiac pulmonary complications).  Patient verbalized understanding and gave verbal consent  to proceed with procedure.   Constipation, unspecified constipation type RLQ abdominal pain 1-2 stools per week with straining and RLQ pain when he ignores the call to stool  - recommend miralax and adjust dose based on response - increase water, increase fiber, increase exercise - colonoscopy for further evaluation   [facility] 08/29/2023, 8:59 AM  Cc: Georganna Skeans, MD

## 2023-08-30 NOTE — Progress Notes (Signed)
Agree with the assessment and plan as outlined by Boone Master, PA-C.

## 2023-09-09 ENCOUNTER — Encounter: Payer: Self-pay | Admitting: Gastroenterology

## 2023-09-14 ENCOUNTER — Ambulatory Visit: Payer: BC Managed Care – PPO | Admitting: Family Medicine

## 2023-09-20 ENCOUNTER — Ambulatory Visit (AMBULATORY_SURGERY_CENTER): Payer: BC Managed Care – PPO | Admitting: Gastroenterology

## 2023-09-20 ENCOUNTER — Encounter: Payer: Self-pay | Admitting: Gastroenterology

## 2023-09-20 VITALS — BP 132/69 | HR 51 | Temp 97.8°F | Resp 12 | Ht 71.0 in | Wt 236.0 lb

## 2023-09-20 DIAGNOSIS — K625 Hemorrhage of anus and rectum: Secondary | ICD-10-CM | POA: Diagnosis not present

## 2023-09-20 MED ORDER — SODIUM CHLORIDE 0.9 % IV SOLN
500.0000 mL | Freq: Once | INTRAVENOUS | Status: DC
Start: 2023-09-20 — End: 2023-09-20

## 2023-09-20 NOTE — Progress Notes (Signed)
Vss nad trans to pacu 

## 2023-09-20 NOTE — Patient Instructions (Signed)
Resume previous diet and medications. Repeat Colonoscopy at age 32 for screening purposes. Recommend daily fiber supplement to reduce hemorrhoidal bleeding.   YOU HAD AN ENDOSCOPIC PROCEDURE TODAY AT THE [facility]:   Refer to the procedure report that was given to you for any specific questions about what was found during the examination.  If the procedure report does not answer your questions, please call your gastroenterologist to clarify.  If you requested that your care partner not be given the details of your procedure findings, then the procedure report has been included in a sealed envelope for you to review at your convenience later.  YOU SHOULD EXPECT: Some feelings of bloating in the abdomen. Passage of more gas than usual.  Walking can help get rid of the air that was put into your GI tract during the procedure and reduce the bloating. If you had a lower endoscopy (such as a colonoscopy or flexible sigmoidoscopy) you may notice spotting of blood in your stool or on the toilet paper. If you underwent a bowel prep for your procedure, you may not have a normal bowel movement for a few days.  Please Note:  You might notice some irritation and congestion in your nose or some drainage.  This is from the oxygen used during your procedure.  There is no need for concern and it should clear up in a day or so.  SYMPTOMS TO REPORT IMMEDIATELY:  Following lower endoscopy (colonoscopy or flexible sigmoidoscopy):  Excessive amounts of blood in the stool  Significant tenderness or worsening of abdominal pains  Swelling of the abdomen that is new, acute  Fever of 100F or higher  For urgent or emergent issues, a gastroenterologist can be reached at any hour by calling [phone]. Do not use MyChart messaging for urgent concerns.    DIET:  We do recommend a small meal at first, but then you may proceed to your regular diet.  Drink plenty of fluids but you should avoid alcoholic  beverages for 24 hours.  ACTIVITY:  You should plan to take it easy for the rest of today and you should NOT DRIVE or use heavy machinery until tomorrow (because of the sedation medicines used during the test).    FOLLOW UP: Our staff will call the number listed on your records the next business day following your procedure.  We will call around 7:15- 8:00 am to check on you and address any questions or concerns that you may have regarding the information given to you following your procedure. If we do not reach you, we will leave a message.     If any biopsies were taken you will be contacted by phone or by letter within the next 1-3 weeks.  Please call us at [phone] if you have not heard about the biopsies in 3 weeks.    SIGNATURES/CONFIDENTIALITY: You and/or your care partner have signed paperwork which will be entered into your electronic medical record.  These signatures attest to the fact that that the information above on your After Visit Summary has been reviewed and is understood.  Full responsibility of the confidentiality of this discharge information lies with you and/or your care-partner.

## 2023-09-20 NOTE — Progress Notes (Signed)
Vitals-DT  Pt's states no medical or surgical changes since previsit or office visit.

## 2023-09-20 NOTE — Progress Notes (Signed)
History and Physical Interval Note:  09/20/2023 9:43 AM  Victor Burns  has presented today for endoscopic procedure(s), with the diagnosis of  Encounter Diagnosis  Name Primary?   Rectal bleeding Yes  .  The various methods of evaluation and treatment have been discussed with the patient and/or family. After consideration of risks, benefits and other options for treatment, the patient has consented to  the endoscopic procedure(s).   The patient's history has been reviewed, patient examined, no change in status, stable for endoscopic procedure(s).  I have reviewed the patient's chart and labs.  Questions were answered to the patient's satisfaction.     Tarren Sabree E. Tomasa Rand, MD [facility]

## 2023-09-20 NOTE — Op Note (Signed)
[facility] Patient Name: Victor Burns Procedure Date: 09/20/2023 9:43 AM MRN: [id number] Endoscopist: Lorin Picket E. Tomasa Rand , MD, [id number] Age: 32 Referring MD:  Date of Birth: [birth year] Gender: Male Account #: [id number] Procedure:                Colonoscopy Indications:              Hematochezia; history of perianal fistula/abscess                            requiring debridement, fistulotomy and seton                            placement Medicines:                Monitored Anesthesia Care Procedure:                Pre-Anesthesia Assessment:                           - Prior to the procedure, a History and Physical                            was performed, and patient medications and                            allergies were reviewed. The patient's tolerance of                            previous anesthesia was also reviewed. The risks                            and benefits of the procedure and the sedation                            options and risks were discussed with the patient.                            All questions were answered, and informed consent                            was obtained. Prior Anticoagulants: The patient has                            taken no anticoagulant or antiplatelet agents. ASA                            Grade Assessment: II - A patient with mild systemic                            disease. After reviewing the risks and benefits,                            the patient was deemed in satisfactory condition to  undergo the procedure.                           After obtaining informed consent, the colonoscope                            was passed under direct vision. Throughout the                            procedure, the patient's blood pressure, pulse, and                            oxygen saturations were monitored continuously. The                            Olympus Scope SN [id number] was introduced through  the                            anus and advanced to the the terminal ileum, with                            identification of the appendiceal orifice and IC                            valve. The colonoscopy was performed without                            difficulty. The patient tolerated the procedure                            well. The quality of the bowel preparation was                            good. The terminal ileum, ileocecal valve,                            appendiceal orifice, and rectum were photographed.                            The bowel preparation used was SUPREP via split                            dose instruction. Scope In: 9:54:20 AM Scope Out: 10:07:10 AM Scope Withdrawal Time: 0 hours 9 minutes 44 seconds  Total Procedure Duration: 0 hours 12 minutes 50 seconds  Findings:                 The perianal exam findings include post surgical                            changes (history of fistulotomy). No                            fistula/drainage, swelling, erythema or  warmth/induration was appreciated                           The digital rectal exam was normal. Pertinent                            negatives include normal sphincter tone and no                            palpable rectal lesions.                           The colon (entire examined portion) appeared normal.                           The terminal ileum appeared normal.                           Non-bleeding internal hemorrhoids were found during                            retroflexion. The hemorrhoids were Grade II                            (internal hemorrhoids that prolapse but reduce                            spontaneously).                           No additional abnormalities were found on                            retroflexion. Complications:            No immediate complications. Estimated Blood Loss:     Estimated blood loss: none. Impression:               -  Post surgical changes (history of fistulotomy)                            found on perianal exam.                           - The entire examined colon is normal.                           - The examined portion of the ileum was normal.                           - Non-bleeding internal hemorrhoids. This is the                            source of the patient's hematochezia.                           - No specimens collected.                           -  No evidence of Crohn's disease. Recommendation:           - Patient has a contact number available for                            emergencies. The signs and symptoms of potential                            delayed complications were discussed with the                            patient. Return to normal activities tomorrow.                            Written discharge instructions were provided to the                            patient.                           - Resume previous diet.                           - Continue present medications.                           - Repeat colonoscopy at age 34 for screening                            purposes.                           - Recommend daily fiber supplementation to reduce                            hemorrhoidal bleeding. Thi Klich E. Tomasa Rand, MD 09/20/2023 10:15:12 AM This report has been signed electronically.

## 2023-09-21 ENCOUNTER — Telehealth: Payer: Self-pay

## 2023-09-21 NOTE — Telephone Encounter (Signed)
Attempted follow up call; message machine went to a message stating voicemail not set up yet; unable to leave message.

## 2023-09-26 ENCOUNTER — Ambulatory Visit (HOSPITAL_COMMUNITY)
Admission: EM | Admit: 2023-09-26 | Discharge: 2023-09-26 | Disposition: A | Discharge status: Home / Self Care | Payer: BC Managed Care – PPO | Attending: Internal Medicine | Admitting: Internal Medicine

## 2023-09-26 ENCOUNTER — Encounter (HOSPITAL_COMMUNITY): Payer: Self-pay

## 2023-09-26 DIAGNOSIS — S29011A Strain of muscle and tendon of front wall of thorax, initial encounter: Secondary | ICD-10-CM | POA: Diagnosis not present

## 2023-09-26 DIAGNOSIS — K2900 Acute gastritis without bleeding: Secondary | ICD-10-CM

## 2023-09-26 DIAGNOSIS — J4520 Mild intermittent asthma, uncomplicated: Secondary | ICD-10-CM

## 2023-09-26 DIAGNOSIS — K219 Gastro-esophageal reflux disease without esophagitis: Secondary | ICD-10-CM

## 2023-09-26 MED ORDER — LIDOCAINE VISCOUS HCL 2 % MT SOLN
OROMUCOSAL | Status: AC
  Filled 2023-09-26: qty 15

## 2023-09-26 MED ORDER — ALUM & MAG HYDROXIDE-SIMETH 200-200-20 MG/5ML PO SUSP
ORAL | Status: AC
  Filled 2023-09-26: qty 30

## 2023-09-26 MED ORDER — ALBUTEROL SULFATE HFA 108 (90 BASE) MCG/ACT IN AERS: 2.0000 | INHALATION_SPRAY | Freq: Four times a day (QID) | RESPIRATORY_TRACT | 2 refills | Status: DC | PRN

## 2023-09-26 MED ORDER — LIDOCAINE VISCOUS HCL 2 % MT SOLN
15.0000 mL | Freq: Once | OROMUCOSAL | Status: AC
  Administered 2023-09-26: 15 mL via OROMUCOSAL

## 2023-09-26 MED ORDER — ALBUTEROL SULFATE (2.5 MG/3ML) 0.083% IN NEBU: 2.5000 mg | INHALATION_SOLUTION | RESPIRATORY_TRACT | 2 refills | Status: AC | PRN

## 2023-09-26 MED ORDER — BACLOFEN 10 MG PO TABS: 10.0000 mg | ORAL_TABLET | Freq: Three times a day (TID) | ORAL | 0 refills | Status: DC | PRN

## 2023-09-26 MED ORDER — ALUM & MAG HYDROXIDE-SIMETH 200-200-20 MG/5ML PO SUSP
30.0000 mL | Freq: Once | ORAL | Status: AC
  Administered 2023-09-26: 30 mL via ORAL

## 2023-09-26 NOTE — ED Provider Notes (Signed)
[facility]    CSN: [id number] Arrival date & time: 09/26/23  1801      History   Chief Complaint Chief Complaint  Patient presents with   GI Problem    HPI Victor Burns is a 32 y.o. male.   32 year old male who presents to urgent care with burning sensation in the chest with burping that started yesterday. Woke up feeling almost like not able to breath but after burping the sensation went away. Now a constant burning sensation. Nothing comes up with the burping.  He feels like there is something in his chest area.  He says water helps a little. He has tried tums without relief. Denies vomiting or nausea.  He specifically denies shortness of breath but feels like when he takes a deep breath and that the burning gets worse.  He did drink more alcohol than usual last week but has not had any the last 2 days.   GI Problem Associated symptoms include chest pain. Pertinent negatives include no abdominal pain and no shortness of breath.    Past Medical History:  Diagnosis Date   Abscess    Anxiety    Arthritis    knee, back   Asthma    Elevated cholesterol    Kidney stones     Patient Active Problem List   Diagnosis Date Noted   Prediabetes 06/03/2022   Hyperlipidemia 06/03/2022   GERD (gastroesophageal reflux disease) 11/24/2021   Perirectal abscess s/p I&D 06/16/2018 06/16/2018   Alcohol abuse 06/16/2018   Mild persistent asthma 10/07/2017    Past Surgical History:  Procedure Laterality Date   EVALUATION UNDER ANESTHESIA WITH HEMORRHOIDECTOMY N/A 08/08/2019   Procedure: EXAM UNDER ANESTHESIA WITH IRRIGATION AND DRAINAGE OF PERIRECTAL ABSCESS;  Surgeon: Andria Meuse, MD;  Location: [facility];  Service: General;  Laterality: N/A;   FISTULOTOMY N/A 02/20/2020   Procedure: PARTIAL  FISTULOTOMY WITH PLACEMENT OF CUTTING TETON;  Surgeon: Andria Meuse, MD;  Location: [facility];  Service: General;  Laterality: N/A;   INCISION AND DRAINAGE PERIRECTAL ABSCESS  N/A 06/16/2018   Procedure: Francia Greaves UNDER ANESTHESIA IRRIGATION AND DEBRIDEMENT PERIRECTAL ABSCESS;  Surgeon: Emelia Loron, MD;  Location: [facility];  Service: General;  Laterality: N/A;   KNEE ARTHROSCOPY     PLACEMENT OF SETON  08/08/2019   Procedure: PLACEMENT OF DRAINING SETON;  Surgeon: Andria Meuse, MD;  Location: [facility];  Service: General;;   RECTAL EXAM UNDER ANESTHESIA N/A 02/20/2020   Procedure: ANORECTAL EXAM UNDER ANESTHESIA;  Surgeon: Andria Meuse, MD;  Location: [facility];  Service: General;  Laterality: N/A;       Home Medications    Prior to Admission medications   Medication Sig Start Date End Date Taking? Authorizing Provider  acetaminophen (TYLENOL) 500 MG tablet Take 500 mg by mouth every 6 (six) hours as needed.    [provider]  albuterol (PROVENTIL) (2.5 MG/3ML) 0.083% nebulizer solution Take 3 mLs (2.5 mg total) by nebulization every 4 (four) hours as needed for wheezing or shortness of breath. 11/11/22 11/11/23  Elson Areas, PA-C  albuterol (VENTOLIN HFA) 108 (90 Base) MCG/ACT inhaler Inhale 2 puffs into the lungs every 6 (six) hours as needed for wheezing or shortness of breath. 02/11/23   Georganna Skeans, MD  atorvastatin (LIPITOR) 20 MG tablet Take 1 tablet by mouth once daily 05/26/23   Georganna Skeans, MD  calcium carbonate (TUMS - DOSED IN MG ELEMENTAL CALCIUM) 500 MG chewable tablet Chew 2 tablets  by mouth daily. As needed.    [provider]  NON FORMULARY "Milk decile"    [provider]  Omega-3 Fatty Acids (FISH OIL) 500 MG CAPS Take 1,000 mg by mouth daily.    [provider]  omeprazole (PRILOSEC) 20 MG capsule Take 1 capsule (20 mg total) by mouth daily. 08/17/23   Rema Fendt, NP  ondansetron (ZOFRAN-ODT) 4 MG disintegrating tablet Take 1 tablet (4 mg total) by mouth every 8 (eight) hours as needed for nausea or vomiting. 08/17/23   Rema Fendt, NP  SUMAtriptan (IMITREX) 50 MG tablet Take 50 mg (1  tablet total) by mouth at the start of the headache. May repeat in 2 hours x 1 if headache persists. Max of 2 tablets/24 hours. 08/17/23   Rema Fendt, NP    Family History Family History  Problem Relation Age of Onset   Hypertension Mother    Hypertension Father    Pancreatic cancer Maternal Aunt    Lung cancer Maternal Aunt    Diabetes Maternal Grandmother    Colon cancer Maternal Grandfather    Stroke Maternal Grandfather    Lung cancer Maternal Grandfather    Brain cancer Maternal Grandfather    Stomach cancer Neg Hx    Rectal cancer Neg Hx    Esophageal cancer Neg Hx     Social History Social History   Tobacco Use   Smoking status: Some Days    Current packs/day: 0.25    Average packs/day: 0.3 packs/day for 10.0 years (2.5 ttl pk-yrs)    Types: Cigarettes    Passive exposure: Current   Smokeless tobacco: Never   Tobacco comments:    Smokes on weekends   Vaping Use   Vaping status: Never Used  Substance Use Topics   Alcohol use: Yes    Comment: 2 bottles liquor/ per wk   Drug use: Yes    Frequency: 3.0 times per week    Types: Marijuana    Comment: 09/17/2023     Allergies   Bee venom, Shellfish allergy, Compazine [prochlorperazine], and Other   Review of Systems Review of Systems  Constitutional:  Negative for chills and fever.  HENT:  Negative for ear pain and sore throat.   Eyes:  Negative for pain and visual disturbance.  Respiratory:  Negative for cough and shortness of breath.   Cardiovascular:  Positive for chest pain. Negative for palpitations.  Gastrointestinal:  Negative for abdominal pain, constipation, diarrhea, nausea and vomiting.       Burning sensation in the chest with burping  Genitourinary:  Negative for dysuria and hematuria.  Musculoskeletal:  Negative for arthralgias and back pain.  Skin:  Negative for color change and rash.  Neurological:  Negative for seizures and syncope.  All other systems reviewed and are  negative.    Physical Exam Triage Vital Signs ED Triage Vitals  Encounter Vitals Group     BP 09/26/23 1850 138/89     Systolic BP Percentile --      Diastolic BP Percentile --      Pulse Rate 09/26/23 1850 61     Resp 09/26/23 1850 16     Temp 09/26/23 1850 98.8 F (37.1 C)     Temp Source 09/26/23 1850 Oral     SpO2 09/26/23 1850 95 %     Weight 09/26/23 1850 265 lb (120.2 kg)     Height 09/26/23 1850 5\' 11"  (1.803 m)     Head Circumference --  Peak Flow --      Pain Score 09/26/23 1848 6     Pain Loc --      Pain Education --      Exclude from Growth Chart --    No data found.  Updated Vital Signs BP 138/89 (BP Location: Left Arm)   Pulse 61   Temp 98.8 F (37.1 C) (Oral)   Resp 16   Ht 5\' 11"  (1.803 m)   Wt 265 lb (120.2 kg)   SpO2 95%   BMI 36.96 kg/m   Visual Acuity Right Eye Distance:   Left Eye Distance:   Bilateral Distance:    Right Eye Near:   Left Eye Near:    Bilateral Near:     Physical Exam Vitals and nursing note reviewed.  Constitutional:      General: He is not in acute distress.    Appearance: He is well-developed.  HENT:     Head: Normocephalic and atraumatic.     Right Ear: Tympanic membrane and ear canal normal.     Left Ear: Tympanic membrane and ear canal normal.     Nose: Nose normal.     Mouth/Throat:     Mouth: Mucous membranes are moist.     Pharynx: No oropharyngeal exudate or posterior oropharyngeal erythema.  Eyes:     Conjunctiva/sclera: Conjunctivae normal.  Cardiovascular:     Rate and Rhythm: Normal rate and regular rhythm.     Heart sounds: Normal heart sounds. No murmur heard. Pulmonary:     Effort: Pulmonary effort is normal. No respiratory distress.     Breath sounds: Normal breath sounds. No decreased air movement. No decreased breath sounds, wheezing or rhonchi.  Abdominal:     Palpations: Abdomen is soft.     Tenderness: There is no abdominal tenderness.  Musculoskeletal:        General: No  swelling.     Cervical back: Neck supple.  Skin:    General: Skin is warm and dry.     Capillary Refill: Capillary refill takes less than 2 seconds.  Neurological:     Mental Status: He is alert.  Psychiatric:        Mood and Affect: Mood normal.      UC Treatments / Results  Labs (all labs ordered are listed, but only abnormal results are displayed) Labs Reviewed - No data to display  EKG   Radiology No results found.  Procedures Procedures (including critical care time)  Medications Ordered in UC Medications - No data to display  Initial Impression / Assessment and Plan / UC Course  I have reviewed the triage vital signs and the nursing notes.  Pertinent labs & imaging results that were available during my care of the patient were reviewed by me and considered in my medical decision making (see chart for details).     Other acute gastritis without hemorrhage  Gastroesophageal reflux disease, unspecified whether esophagitis present  Intermittent asthma without complication, unspecified asthma severity  Muscle strain of chest wall, initial encounter   Chest burning may be due to combination of gastritis and musculoskeletal. EKG(to check the heart) was normal in urgent care today. Treated with GI cocktail (lidocaine and Maalox). Will send home on the following: Baclofen 10mg  3 times daily as needed for muscle spasms. Use caution as this can make you sleepy Make sure to take Prilosec daily  Will refill Albuterol inhaler/nebs and recommend starting zyrtec/claritin to help with seasonal worsening of Asthma Follow up  with PCP for your asthma to discuss symptoms Return to urgent care or PCP if symptoms worsen or fail to resolve.    Final Clinical Impressions(s) / UC Diagnoses   Final diagnoses:  None   Discharge Instructions   None    ED Prescriptions   None    PDMP not reviewed this encounter.   Landis Martins, [place] 09/26/23 2040

## 2023-09-26 NOTE — Discharge Instructions (Addendum)
Chest burning may be due to combination of gastritis and musculoskeletal. EKG(to check the heart) was normal in urgent care today. Treated with GI cocktail (lidocaine and Maalox). Will send home on the following: Baclofen 10mg  3 times daily as needed for muscle spasms. Use caution as this can make you sleepy Make sure to take Prilosec daily  Will refill Albuterol inhaler/nebs and recommend starting zyrtec/claritin to help with seasonal worsening of Asthma Follow up with PCP for your asthma to discuss symptoms Return to urgent care or PCP if symptoms worsen or fail to resolve.

## 2023-09-26 NOTE — ED Triage Notes (Signed)
Patient here today with c/o burning sensation in chest and burping since yesterday. Patient states that he woke up yesterday with difficulty breathing and after burping, he was able to breathe better. Patient has h/o asthma and uses an inhaler. Has a h/o of GERD but has not taking Omeprazole in a while. He has been taking TUMS with no relief.

## 2023-09-27 ENCOUNTER — Emergency Department (HOSPITAL_BASED_OUTPATIENT_CLINIC_OR_DEPARTMENT_OTHER)
Admission: EM | Admit: 2023-09-27 | Discharge: 2023-09-28 | Disposition: A | Discharge status: Home / Self Care | Payer: BC Managed Care – PPO | Attending: Emergency Medicine | Admitting: Emergency Medicine

## 2023-09-27 ENCOUNTER — Emergency Department (HOSPITAL_BASED_OUTPATIENT_CLINIC_OR_DEPARTMENT_OTHER): Payer: BC Managed Care – PPO | Admitting: Radiology

## 2023-09-27 ENCOUNTER — Encounter (HOSPITAL_BASED_OUTPATIENT_CLINIC_OR_DEPARTMENT_OTHER): Payer: Self-pay | Admitting: Emergency Medicine

## 2023-09-27 DIAGNOSIS — R079 Chest pain, unspecified: Secondary | ICD-10-CM | POA: Diagnosis not present

## 2023-09-27 DIAGNOSIS — K219 Gastro-esophageal reflux disease without esophagitis: Secondary | ICD-10-CM | POA: Diagnosis not present

## 2023-09-27 DIAGNOSIS — Z20822 Contact with and (suspected) exposure to covid-19: Secondary | ICD-10-CM | POA: Insufficient documentation

## 2023-09-27 DIAGNOSIS — J45909 Unspecified asthma, uncomplicated: Secondary | ICD-10-CM | POA: Insufficient documentation

## 2023-09-27 DIAGNOSIS — K2900 Acute gastritis without bleeding: Secondary | ICD-10-CM | POA: Insufficient documentation

## 2023-09-27 DIAGNOSIS — R0602 Shortness of breath: Secondary | ICD-10-CM | POA: Diagnosis not present

## 2023-09-27 DIAGNOSIS — R0789 Other chest pain: Secondary | ICD-10-CM | POA: Diagnosis not present

## 2023-09-27 LAB — CBC
HCT: 39.1 % (ref 39.0–52.0)
Hemoglobin: 13.1 g/dL (ref 13.0–17.0)
MCH: 29.6 pg (ref 26.0–34.0)
MCHC: 33.5 g/dL (ref 30.0–36.0)
MCV: 88.5 fL (ref 80.0–100.0)
Platelets: 313 10*3/uL (ref 150–400)
RBC: 4.42 MIL/uL (ref 4.22–5.81)
RDW: 12.9 % (ref 11.5–15.5)
WBC: 6 10*3/uL (ref 4.0–10.5)
nRBC: 0 % (ref 0.0–0.2)

## 2023-09-27 LAB — BASIC METABOLIC PANEL
Anion gap: 9 (ref 5–15)
BUN: 18 mg/dL (ref 6–20)
CO2: 24 mmol/L (ref 22–32)
Calcium: 9.9 mg/dL (ref 8.9–10.3)
Chloride: 102 mmol/L (ref 98–111)
Creatinine, Ser: 1.04 mg/dL (ref 0.61–1.24)
GFR, Estimated: >60 mL/min (ref >60)
Glucose, Bld: 119 mg/dL — ABNORMAL HIGH (ref 70–99)
Potassium: 3.9 mmol/L (ref 3.5–5.1)
Sodium: 135 mmol/L (ref 135–145)

## 2023-09-27 LAB — TROPONIN I (HIGH SENSITIVITY)
Troponin I (High Sensitivity): 7 ng/L (ref <18)
Troponin I (High Sensitivity): 9 ng/L (ref <18)

## 2023-09-27 MED ORDER — LIDOCAINE VISCOUS HCL 2 % MT SOLN
15.0000 mL | Freq: Once | OROMUCOSAL | Status: AC
  Administered 2023-09-27: 15 mL via ORAL
  Filled 2023-09-27: qty 15

## 2023-09-27 MED ORDER — ALUM & MAG HYDROXIDE-SIMETH 200-200-20 MG/5ML PO SUSP
30.0000 mL | Freq: Once | ORAL | Status: AC
  Administered 2023-09-27: 30 mL via ORAL
  Filled 2023-09-27: qty 30

## 2023-09-27 MED ORDER — FAMOTIDINE 20 MG PO TABS
20.0000 mg | ORAL_TABLET | Freq: Once | ORAL | Status: AC
  Administered 2023-09-27: 20 mg via ORAL
  Filled 2023-09-27: qty 1

## 2023-09-27 NOTE — ED Triage Notes (Signed)
Stabbing pain in chest. Burping. Burning Some sob Seen at [facility] given omeprazole yesterday, not gotten better.

## 2023-09-27 NOTE — ED Provider Notes (Signed)
[place] [facility] Provider Note   CSN: [id number] Arrival date & time: 09/27/23  7564     History  Chief Complaint  Patient presents with   Chest Pain    Victor Burns is a 32 y.o. male.   Chest Pain    32 year old male with medical history significant for asthma, nephrolithiasis, HLD, anxiety who presents to the emergency department with a chief complaint of burning chest discomfort.  He states that the pain started on Sunday.  He went out for a night of heavy drinking on Saturday night.  He describes a burning discomfort in his chest that radiates upward, no radiation to the back, no ripping or tearing component.  She he does complain of a slight pleuritic component with an associated cough.  No fevers or chills.  The cough is nonproductive.  No lower extremity swelling, no history of DVT or PE.  He was seen at urgent care yesterday and was diagnosed with GERD and provided with a GI cocktail and is now taking omeprazole but has not been getting better.  He woke up last night with more severe pain while lying flat and his presentation to the ER today.  Home Medications Prior to Admission medications   Medication Sig Start Date End Date Taking? Authorizing Provider  famotidine (PEPCID) 20 MG tablet Take 1 tablet (20 mg total) by mouth daily. 09/28/23 10/28/23 Yes Ernie Avena, MD  acetaminophen (TYLENOL) 500 MG tablet Take 500 mg by mouth every 6 (six) hours as needed.    [provider]  albuterol (PROVENTIL) (2.5 MG/3ML) 0.083% nebulizer solution Take 3 mLs (2.5 mg total) by nebulization every 4 (four) hours as needed for wheezing or shortness of breath. 09/26/23 09/25/24  Landis Martins, PA-C  albuterol (VENTOLIN HFA) 108 (90 Base) MCG/ACT inhaler Inhale 2 puffs into the lungs every 6 (six) hours as needed for wheezing or shortness of breath. 09/26/23   White, Elizabeth A, PA-C  atorvastatin (LIPITOR) 20 MG tablet Take 1 tablet by  mouth once daily 05/26/23   Georganna Skeans, MD  baclofen (LIORESAL) 10 MG tablet Take 1 tablet (10 mg total) by mouth 3 (three) times daily as needed for muscle spasms. 09/26/23   White, Elizabeth A, PA-C  calcium carbonate (TUMS - DOSED IN MG ELEMENTAL CALCIUM) 500 MG chewable tablet Chew 2 tablets by mouth daily. As needed.    [provider]  NON FORMULARY "Milk decile"    [provider]  Omega-3 Fatty Acids (FISH OIL) 500 MG CAPS Take 1,000 mg by mouth daily.    [provider]  omeprazole (PRILOSEC) 20 MG capsule Take 1 capsule (20 mg total) by mouth daily. 08/17/23   Rema Fendt, NP  ondansetron (ZOFRAN-ODT) 4 MG disintegrating tablet Take 1 tablet (4 mg total) by mouth every 8 (eight) hours as needed for nausea or vomiting. 08/17/23   Rema Fendt, NP  SUMAtriptan (IMITREX) 50 MG tablet Take 50 mg (1 tablet total) by mouth at the start of the headache. May repeat in 2 hours x 1 if headache persists. Max of 2 tablets/24 hours. 08/17/23   Rema Fendt, NP      Allergies    Bee venom, Shellfish allergy, Compazine [prochlorperazine], and Other    Review of Systems   Review of Systems  Cardiovascular:  Positive for chest pain.  All other systems reviewed and are negative.   Physical Exam Updated Vital Signs BP 125/85   Pulse 74  Temp 97.6 F (36.4 C) (Oral)   Resp 16   SpO2 97%  Physical Exam Vitals and nursing note reviewed.  Constitutional:      General: He is not in acute distress.    Appearance: He is well-developed.  HENT:     Head: Normocephalic and atraumatic.  Eyes:     Conjunctiva/sclera: Conjunctivae normal.  Cardiovascular:     Rate and Rhythm: Normal rate and regular rhythm.     Heart sounds: No murmur heard.    Comments: 2+ radial pulses bilaterally Pulmonary:     Effort: Pulmonary effort is normal. No respiratory distress.     Breath sounds: Normal breath sounds.  Abdominal:     Palpations: Abdomen is soft.      Tenderness: There is no abdominal tenderness.  Musculoskeletal:        General: No swelling.     Cervical back: Neck supple.  Skin:    General: Skin is warm and dry.     Capillary Refill: Capillary refill takes less than 2 seconds.  Neurological:     Mental Status: He is alert.  Psychiatric:        Mood and Affect: Mood normal.     ED Results / Procedures / Treatments   Labs (all labs ordered are listed, but only abnormal results are displayed) Labs Reviewed  BASIC METABOLIC PANEL - Abnormal; Notable for the following components:      Result Value   Glucose, Bld 119 (*)    All other components within normal limits  RESP PANEL BY RT-PCR (RSV, FLU A&B, COVID)  [id number]  CBC  D-DIMER, QUANTITATIVE  TROPONIN I (HIGH SENSITIVITY)  TROPONIN I (HIGH SENSITIVITY)    EKG EKG Interpretation Date/Time:  Tuesday September 27 2023 18:37:24 EDT Ventricular Rate:  87 PR Interval:  180 QRS Duration:  84 QT Interval:  342 QTC Calculation: 411 R Axis:   37  Text Interpretation: Normal sinus rhythm Benign early repolarization No significant change since last tracing Confirmed by Ernie Avena ([id number]) on 09/27/2023 11:23:25 PM  Radiology DG Chest 2 View  Result Date: 09/27/2023 CLINICAL DATA:  Chest pain and shortness of breath. EXAM: CHEST - 2 VIEW COMPARISON:  11/11/2022. FINDINGS: The heart size and mediastinal contours are within normal limits. Both lungs are clear. No acute osseous abnormality. IMPRESSION: No active cardiopulmonary disease. Electronically Signed   By: Thornell Sartorius M.D.   On: 09/27/2023 21:29    Procedures Procedures    Medications Ordered in ED Medications  alum & mag hydroxide-simeth (MAALOX/MYLANTA) 200-200-20 MG/5ML suspension 30 mL (30 mLs Oral Given 09/27/23 2340)    And  lidocaine (XYLOCAINE) 2 % viscous mouth solution 15 mL (15 mLs Oral Given 09/27/23 2340)  famotidine (PEPCID) tablet 20 mg (20 mg Oral Given 09/27/23 2340)    ED Course/ Medical Decision  Making/ A&P             HEART Score: 0                    Medical Decision Making Amount and/or Complexity of Data Reviewed Labs: ordered. Radiology: ordered.  Risk OTC drugs. Prescription drug management.    32 year old male with medical history significant for asthma, nephrolithiasis, HLD, anxiety who presents to the emergency department with a chief complaint of burning chest discomfort.  He states that the pain started on Sunday.  He went out for a night of heavy drinking on Saturday night.  He describes a burning discomfort in  his chest that radiates upward, no radiation to the back, no ripping or tearing component.  She he does complain of a slight pleuritic component with an associated cough.  No fevers or chills.  The cough is nonproductive.  No lower extremity swelling, no history of DVT or PE.  He was seen at urgent care yesterday and was diagnosed with GERD and provided with a GI cocktail and is now taking omeprazole but has not been getting better.  He woke up last night with more severe pain while lying flat and his presentation to the ER today.  Medical Decision Making: Victor Burns is a 32 y.o. male who presented to the ED today with chest pain, detailed above.  Based on patient's comorbidities, patient has a heart score of 0.     Complete initial physical exam performed, notably the patient was well appearing in NAD, lungs CTAB, symmetric pulses.   Reviewed and confirmed nursing documentation for past medical history, family history, social history.    Initial Assessment: With the patient's presentation of left-sided chest pain, most likely diagnosis is musculoskeletal chest pain versus GERD, although ACS remains on the differential. Other diagnoses were considered including (but not limited to) pulmonary embolism, community-acquired pneumonia, aortic dissection, pneumothorax, underlying bony abnormality, anemia. These are considered less likely due to history of present  illness and physical exam findings.    In particular, concerning pulmonary embolism: Patient is PERC negative and the they deny malignancy, recent surgery, history of DVT, or calf tenderness leading to a low risk Wells score. Aortic Dissection also considered but seems less likely based on the location, quality, onset, and severity of symptoms in this case.   Initial Plan: Evaluate for ACS with delta troponin and EKG evaluated as below  Evaluate for dissection, bony abnormality, or pneumonia with chest x-ray and screening laboratory evaluation including CBC, BMP  Further evaluation for pulmonary embolism  low likelihood, dimer obtained Further evaluation for Thoracic Aortic Dissection not indicated at this time based on patient's clinical history and PE findings.   Initial Study Results: EKG was reviewed independently. Rate, rhythm, axis, intervals all examined and without medically relevant abnormality. ST segments without concerns for elevations.    Laboratory  Delta troponin demonstrated normal values  D-dimer negative Covid and Flu negative   CBC and BMP without obvious metabolic or inflammatory abnormalities requiring further evaluation   Radiology  DG Chest 2 View  Result Date: 09/27/2023 CLINICAL DATA:  Chest pain and shortness of breath. EXAM: CHEST - 2 VIEW COMPARISON:  11/11/2022. FINDINGS: The heart size and mediastinal contours are within normal limits. Both lungs are clear. No acute osseous abnormality. IMPRESSION: No active cardiopulmonary disease. Electronically Signed   By: Thornell Sartorius M.D.   On: 09/27/2023 21:29    Final Assessment and Plan: Chest x-ray was without active cardiopulmonary disease.  EKG revealed benign early repolarization, screening labs were obtained and were ultimately negative, delta troponin is negative, dimer normal, COVID and flu negative.  No significant electrolyte abnormality.  Patient symptoms are very consistent with gastritis and GERD in the  setting of alcohol use.  GI cocktail was provided.  Some minimal relief.  As the patient is only on a PPI, will also treat with Maalox and oral Pepcid which was prescribed.  Patient advised to follow-up outpatient with his PCP, referral placed for outpatient follow-up with GI as needed if symptoms persist.  Oral intake.  Return precautions provided, stable for discharge.   Final Clinical Impression(s) /  ED Diagnoses Final diagnoses:  Acute gastritis without hemorrhage, unspecified gastritis type  Gastroesophageal reflux disease, unspecified whether esophagitis present    Rx / DC Orders ED Discharge Orders          Ordered    famotidine (PEPCID) 20 MG tablet  Daily        09/28/23 0039              Ernie Avena, MD 09/28/23 0321

## 2023-09-28 LAB — RESP PANEL BY RT-PCR (RSV, FLU A&B, COVID)  RVPGX2
Influenza A by PCR: NEGATIVE
Influenza B by PCR: NEGATIVE
Resp Syncytial Virus by PCR: NEGATIVE
SARS Coronavirus 2 by RT PCR: NEGATIVE

## 2023-09-28 LAB — D-DIMER, QUANTITATIVE: D-Dimer, Quant: <0.27 ug{FEU}/mL (ref 0.00–0.50)

## 2023-09-28 MED ORDER — FAMOTIDINE 20 MG PO TABS
20.0000 mg | ORAL_TABLET | Freq: Every day | ORAL | 0 refills | Status: DC
Start: 2023-09-28 — End: 2024-02-07

## 2023-09-28 NOTE — Discharge Instructions (Addendum)
Your EKG, chest x-ray and laboratory evaluation was overall reassuring today.  Continue to take the Prilosec medicine.  I will start you on Pepcid as well which can help more short-term as the Prilosec take some time to take effect.  Additionally can try over-the-counter Maalox as well.  Follow-up with your PCP and if not improved, follow-up with an outpatient gastroenterologist.

## 2023-10-05 NOTE — Progress Notes (Signed)
A user error has taken place: orders placed in error, not carried out on this patient.

## 2023-10-06 ENCOUNTER — Ambulatory Visit: Payer: BC Managed Care – PPO | Admitting: Family Medicine

## 2023-10-25 ENCOUNTER — Ambulatory Visit: Payer: BC Managed Care – PPO | Admitting: Diagnostic Neuroimaging

## 2023-11-18 ENCOUNTER — Ambulatory Visit: Payer: BC Managed Care – PPO | Admitting: Gastroenterology

## 2023-12-27 ENCOUNTER — Encounter (HOSPITAL_COMMUNITY): Payer: Self-pay

## 2023-12-27 ENCOUNTER — Ambulatory Visit (HOSPITAL_COMMUNITY)
Admission: EM | Admit: 2023-12-27 | Discharge: 2023-12-27 | Disposition: A | Discharge status: Home / Self Care | Payer: BC Managed Care – PPO | Attending: Emergency Medicine | Admitting: Emergency Medicine

## 2023-12-27 DIAGNOSIS — J020 Streptococcal pharyngitis: Secondary | ICD-10-CM

## 2023-12-27 LAB — POCT RAPID STREP A (OFFICE): Rapid Strep A Screen: POSITIVE — AB

## 2023-12-27 MED ORDER — AMOXICILLIN 500 MG PO CAPS: 500.0000 mg | ORAL_CAPSULE | Freq: Two times a day (BID) | ORAL | 0 refills | Status: AC

## 2023-12-27 MED ORDER — IBUPROFEN 100 MG/5ML PO SUSP
ORAL | Status: AC
Start: 2023-12-27 — End: ?
  Filled 2023-12-27: qty 20

## 2023-12-27 MED ORDER — IBUPROFEN 100 MG/5ML PO SUSP
400.0000 mg | Freq: Once | ORAL | Status: AC
  Administered 2023-12-27: 400 mg via ORAL

## 2023-12-27 MED ORDER — LIDOCAINE VISCOUS HCL 2 % MT SOLN
15.0000 mL | OROMUCOSAL | 0 refills | Status: DC | PRN
Start: 2023-12-27 — End: 2024-02-07

## 2023-12-27 NOTE — ED Triage Notes (Addendum)
Dry cough, chest pain with cough, sore throat (stabbing pain), feeling feverish, aching in the legs, headaches, sinus pain and pressure onset Saturday morning with the sore throat. No known sick exposure.   Patient tried Day/Nyquil with no relief. Last taken this morning.

## 2023-12-28 ENCOUNTER — Telehealth (HOSPITAL_COMMUNITY): Payer: Self-pay | Admitting: *Deleted

## 2023-12-28 NOTE — ED Provider Notes (Signed)
[facility]    CSN: [id number] Arrival date & time: 12/27/23  1806      History   Chief Complaint Chief Complaint  Patient presents with   Cough   Headache    HPI Victor Burns is a 33 y.o. male. Dry cough, chest pain with cough, sore throat (stabbing pain), feeling feverish, body aches, headache, sinus pain and pressure onset 12/24/23 morning with the sore throat. No known sick exposure. Sore throat is biggest complaint. Patient tried Day/Nyquil with no relief. Last taken this morning.    Cough Associated symptoms: headaches   Headache Associated symptoms: cough     Past Medical History:  Diagnosis Date   Abscess    Anxiety    Arthritis    knee, back   Asthma    Elevated cholesterol    Kidney stones     Patient Active Problem List   Diagnosis Date Noted   Prediabetes 06/03/2022   Hyperlipidemia 06/03/2022   GERD (gastroesophageal reflux disease) 11/24/2021   Perirectal abscess s/p I&D 06/16/2018 06/16/2018   Alcohol abuse 06/16/2018   Mild persistent asthma 10/07/2017    Past Surgical History:  Procedure Laterality Date   EVALUATION UNDER ANESTHESIA WITH HEMORRHOIDECTOMY N/A 08/08/2019   Procedure: EXAM UNDER ANESTHESIA WITH IRRIGATION AND DRAINAGE OF PERIRECTAL ABSCESS;  Surgeon: Andria Meuse, MD;  Location: [facility];  Service: General;  Laterality: N/A;   FISTULOTOMY N/A 02/20/2020   Procedure: PARTIAL  FISTULOTOMY WITH PLACEMENT OF CUTTING TETON;  Surgeon: Andria Meuse, MD;  Location: [facility];  Service: General;  Laterality: N/A;   INCISION AND DRAINAGE PERIRECTAL ABSCESS N/A 06/16/2018   Procedure: Francia Greaves UNDER ANESTHESIA IRRIGATION AND DEBRIDEMENT PERIRECTAL ABSCESS;  Surgeon: Emelia Loron, MD;  Location: [facility];  Service: General;  Laterality: N/A;   KNEE ARTHROSCOPY     PLACEMENT OF SETON  08/08/2019   Procedure: PLACEMENT OF DRAINING SETON;  Surgeon: Andria Meuse, MD;  Location: [facility];  Service: General;;   RECTAL EXAM  UNDER ANESTHESIA N/A 02/20/2020   Procedure: ANORECTAL EXAM UNDER ANESTHESIA;  Surgeon: Andria Meuse, MD;  Location: [facility];  Service: General;  Laterality: N/A;       Home Medications    Prior to Admission medications   Medication Sig Start Date End Date Taking? Authorizing Provider  acetaminophen (TYLENOL) 500 MG tablet Take 500 mg by mouth every 6 (six) hours as needed.   Yes [provider]  amoxicillin (AMOXIL) 500 MG capsule Take 1 capsule (500 mg total) by mouth 2 (two) times daily for 10 days. 12/27/23 01/06/24 Yes Cathlyn Parsons, NP  calcium carbonate (TUMS - DOSED IN MG ELEMENTAL CALCIUM) 500 MG chewable tablet Chew 2 tablets by mouth daily. As needed.   Yes [provider]  lidocaine (XYLOCAINE) 2 % solution Use as directed 15 mLs in the mouth or throat as needed for mouth pain. 12/27/23  Yes Cathlyn Parsons, NP  Omega-3 Fatty Acids (FISH OIL) 500 MG CAPS Take 1,000 mg by mouth daily.   Yes [provider]  omeprazole (PRILOSEC) 20 MG capsule Take 1 capsule (20 mg total) by mouth daily. 08/17/23  Yes Zonia Kief, Amy J, NP  albuterol (PROVENTIL) (2.5 MG/3ML) 0.083% nebulizer solution Take 3 mLs (2.5 mg total) by nebulization every 4 (four) hours as needed for wheezing or shortness of breath. 09/26/23 09/25/24  Doristine Mango A, PA-C  albuterol (VENTOLIN HFA) 108 (90 Base) MCG/ACT inhaler Inhale 2 puffs into the lungs every 6 (  six) hours as needed for wheezing or shortness of breath. 09/26/23   White, Elizabeth A, PA-C  atorvastatin (LIPITOR) 20 MG tablet Take 1 tablet by mouth once daily 05/26/23   Georganna Skeans, MD  baclofen (LIORESAL) 10 MG tablet Take 1 tablet (10 mg total) by mouth 3 (three) times daily as needed for muscle spasms. 09/26/23   White, Elizabeth A, PA-C  famotidine (PEPCID) 20 MG tablet Take 1 tablet (20 mg total) by mouth daily. 09/28/23 10/28/23  Ernie Avena, MD  NON FORMULARY "Milk decile"    [provider]  ondansetron  (ZOFRAN-ODT) 4 MG disintegrating tablet Take 1 tablet (4 mg total) by mouth every 8 (eight) hours as needed for nausea or vomiting. 08/17/23   Rema Fendt, NP  SUMAtriptan (IMITREX) 50 MG tablet Take 50 mg (1 tablet total) by mouth at the start of the headache. May repeat in 2 hours x 1 if headache persists. Max of 2 tablets/24 hours. 08/17/23   Rema Fendt, NP    Family History Family History  Problem Relation Age of Onset   Hypertension Mother    Hypertension Father    Pancreatic cancer Maternal Aunt    Lung cancer Maternal Aunt    Diabetes Maternal Grandmother    Colon cancer Maternal Grandfather    Stroke Maternal Grandfather    Lung cancer Maternal Grandfather    Brain cancer Maternal Grandfather    Stomach cancer Neg Hx    Rectal cancer Neg Hx    Esophageal cancer Neg Hx     Social History Social History   Tobacco Use   Smoking status: Some Days    Current packs/day: 0.25    Average packs/day: 0.3 packs/day for 10.0 years (2.5 ttl pk-yrs)    Types: Cigarettes    Passive exposure: Current   Smokeless tobacco: Never   Tobacco comments:    Smokes on weekends   Vaping Use   Vaping status: Never Used  Substance Use Topics   Alcohol use: Yes    Comment: 2 bottles liquor/ per wk   Drug use: Yes    Frequency: 3.0 times per week    Types: Marijuana    Comment: 09/17/2023     Allergies   Bee venom, Shellfish allergy, Compazine [prochlorperazine], and Other   Review of Systems Review of Systems  Respiratory:  Positive for cough.   Neurological:  Positive for headaches.     Physical Exam Triage Vital Signs ED Triage Vitals  Encounter Vitals Group     BP 12/27/23 1856 134/78     Systolic BP Percentile --      Diastolic BP Percentile --      Pulse Rate 12/27/23 1856 (!) 109     Resp 12/27/23 1856 18     Temp 12/27/23 1856 (!) 101.9 F (38.8 C)     Temp Source 12/27/23 1856 Oral     SpO2 12/27/23 1856 96 %     Weight 12/27/23 1856 260 lb (117.9 kg)      Height 12/27/23 1856 5\' 11"  (1.803 m)     Head Circumference --      Peak Flow --      Pain Score 12/27/23 1854 7     Pain Loc --      Pain Education --      Exclude from Growth Chart --    No data found.  Updated Vital Signs BP 134/78 (BP Location: Right Arm)   Pulse (!) 109   Temp (!)  101.9 F (38.8 C) (Oral)   Resp 18   Ht 5\' 11"  (1.803 m)   Wt 260 lb (117.9 kg)   SpO2 96%   BMI 36.26 kg/m   Visual Acuity Right Eye Distance:   Left Eye Distance:   Bilateral Distance:    Right Eye Near:   Left Eye Near:    Bilateral Near:     Physical Exam Constitutional:      Appearance: He is ill-appearing.  HENT:     Right Ear: Tympanic membrane, ear canal and external ear normal.     Left Ear: Tympanic membrane, ear canal and external ear normal.     Nose: Congestion and rhinorrhea present.     Mouth/Throat:     Mouth: Mucous membranes are moist.     Pharynx: Oropharyngeal exudate and posterior oropharyngeal erythema present.     Tonsils: 2+ on the right. 2+ on the left.  Cardiovascular:     Rate and Rhythm: Normal rate and regular rhythm.  Pulmonary:     Effort: Pulmonary effort is normal.     Breath sounds: Normal breath sounds.  Lymphadenopathy:     Head:     Right side of head: Submandibular adenopathy present.     Left side of head: Submandibular adenopathy present.  Neurological:     Mental Status: He is alert.      UC Treatments / Results  Labs (all labs ordered are listed, but only abnormal results are displayed) Labs Reviewed  POCT RAPID STREP A (OFFICE) - Abnormal; Notable for the following components:      Result Value   Rapid Strep A Screen Positive (*)    All other components within normal limits    EKG   Radiology No results found.  Procedures Procedures (including critical care time)  Medications Ordered in UC Medications  ibuprofen (ADVIL) 100 MG/5ML suspension 400 mg (400 mg Oral Given 12/27/23 1938)    Initial Impression /  Assessment and Plan / UC Course  I have reviewed the triage vital signs and the nursing notes.  Pertinent labs & imaging results that were available during my care of the patient were reviewed by me and considered in my medical decision making (see chart for details).    Pt with strep A. Tx with amoxicillin. Discussed supportive care.   Final Clinical Impressions(s) / UC Diagnoses   Final diagnoses:  Strep pharyngitis   Discharge Instructions   None    ED Prescriptions     Medication Sig Dispense Auth. Provider   amoxicillin (AMOXIL) 500 MG capsule Take 1 capsule (500 mg total) by mouth 2 (two) times daily for 10 days. 20 capsule Cathlyn Parsons, NP   lidocaine (XYLOCAINE) 2 % solution Use as directed 15 mLs in the mouth or throat as needed for mouth pain. 100 mL Cathlyn Parsons, NP      PDMP not reviewed this encounter.   Cathlyn Parsons, NP 12/28/23 [phone]

## 2023-12-28 NOTE — Telephone Encounter (Signed)
Pt called to see if we can call about rx since it wasn't filled. Per provider Rinaldo Ratel pt should use every three hours as needed. Pharmacy advised and pt aware they are filling rx currently.

## 2024-01-04 ENCOUNTER — Encounter: Payer: Self-pay | Admitting: Family Medicine

## 2024-01-04 ENCOUNTER — Telehealth: Payer: Self-pay | Admitting: Family Medicine

## 2024-01-04 ENCOUNTER — Ambulatory Visit: Payer: BC Managed Care – PPO | Admitting: Family Medicine

## 2024-01-04 NOTE — Telephone Encounter (Signed)
Patient called and spoke with PCE agent and explained he was having car trouble , I let them know that if patient was not here by 2:30 he wouldn't be able to be seen by provider due to her leaving early for the day. Patient didn't show or call office since conversation

## 2024-01-04 NOTE — Telephone Encounter (Unsigned)
Copied from CRM [id number]. Topic: General - Running Late >> Jan 04, 2024 12:59 PM [name] wrote: Patient/patient representative is calling because they are running late for an appointment. Patient is on the side of the road waiting for a tow truck. Per CAL, if patient can arrive by 2:30 PM, they can work him in today. Provider is leaving at 3 PM EST. Called patient back to advise. Patient advised and understood.

## 2024-01-04 NOTE — Telephone Encounter (Signed)
Noted

## 2024-02-01 ENCOUNTER — Ambulatory Visit
Admission: RE | Admit: 2024-02-01 | Discharge: 2024-02-01 | Disposition: A | Discharge status: Home / Self Care | Payer: BC Managed Care – PPO | Source: Ambulatory Visit | Attending: Family Medicine | Admitting: Family Medicine

## 2024-02-01 ENCOUNTER — Telehealth: Payer: Self-pay

## 2024-02-01 VITALS — BP 135/90 | HR 67 | Temp 98.4°F | Resp 18

## 2024-02-01 DIAGNOSIS — H60502 Unspecified acute noninfective otitis externa, left ear: Secondary | ICD-10-CM

## 2024-02-01 DIAGNOSIS — H6992 Unspecified Eustachian tube disorder, left ear: Secondary | ICD-10-CM

## 2024-02-01 MED ORDER — PREDNISONE 20 MG PO TABS
40.0000 mg | ORAL_TABLET | Freq: Every day | ORAL | 0 refills | Status: AC
Start: 2024-02-01 — End: 2024-02-06

## 2024-02-01 MED ORDER — PREDNISONE 20 MG PO TABS: 40.0000 mg | ORAL_TABLET | Freq: Every day | ORAL | 0 refills | Status: DC

## 2024-02-01 MED ORDER — FLUTICASONE PROPIONATE 50 MCG/ACT NA SUSP
1.0000 | Freq: Every day | NASAL | 0 refills | Status: AC
Start: 2024-02-01 — End: ?

## 2024-02-01 MED ORDER — FLUTICASONE PROPIONATE 50 MCG/ACT NA SUSP: 1.0000 | Freq: Every day | NASAL | 0 refills | Status: DC

## 2024-02-01 MED ORDER — OFLOXACIN 0.3 % OT SOLN: 5.0000 [drp] | Freq: Two times a day (BID) | OTIC | 0 refills | Status: DC

## 2024-02-01 MED ORDER — OFLOXACIN 0.3 % OT SOLN
5.0000 [drp] | Freq: Two times a day (BID) | OTIC | 0 refills | Status: AC
Start: 2024-02-01 — End: 2024-02-08

## 2024-02-01 NOTE — ED Triage Notes (Signed)
 Pt presents with c/o lt ear pain X 5 days. Pt states he felt water going down his ear, has not felt anything come out. Pt reports feeling on and off pressure to the lt side of the head.

## 2024-02-01 NOTE — ED Provider Notes (Signed)
 [facility]    CSN: [id number] Arrival date & time: 02/01/24  1735      History   Chief Complaint Chief Complaint  Patient presents with   Ear Injury    Ear pain badly Hurt when I bite down on anything hard very painful - Entered by patient    HPI Victor Burns is a 33 y.o. male presents for ear pain.  Patient reports 5 days of a persistent left ear pain.  Denies any drainage from the ear, fevers or chills, cough/congestion.  Does use Q-tips frequently especially when his ears are itchy.  Occasional earbud use as well.  He has taken some ibuprofen for symptoms with minimal improvement.  No other concerns at this time.  HPI  Past Medical History:  Diagnosis Date   Abscess    Anxiety    Arthritis    knee, back   Asthma    Elevated cholesterol    Kidney stones     Patient Active Problem List   Diagnosis Date Noted   Prediabetes 06/03/2022   Hyperlipidemia 06/03/2022   GERD (gastroesophageal reflux disease) 11/24/2021   Perirectal abscess s/p I&D 06/16/2018 06/16/2018   Alcohol abuse 06/16/2018   Mild persistent asthma 10/07/2017    Past Surgical History:  Procedure Laterality Date   EVALUATION UNDER ANESTHESIA WITH HEMORRHOIDECTOMY N/A 08/08/2019   Procedure: EXAM UNDER ANESTHESIA WITH IRRIGATION AND DRAINAGE OF PERIRECTAL ABSCESS;  Surgeon: Andria Meuse, MD;  Location: [facility];  Service: General;  Laterality: N/A;   FISTULOTOMY N/A 02/20/2020   Procedure: PARTIAL  FISTULOTOMY WITH PLACEMENT OF CUTTING TETON;  Surgeon: Andria Meuse, MD;  Location: [facility];  Service: General;  Laterality: N/A;   INCISION AND DRAINAGE PERIRECTAL ABSCESS N/A 06/16/2018   Procedure: Francia Greaves UNDER ANESTHESIA IRRIGATION AND DEBRIDEMENT PERIRECTAL ABSCESS;  Surgeon: Emelia Loron, MD;  Location: [facility];  Service: General;  Laterality: N/A;   KNEE ARTHROSCOPY     PLACEMENT OF SETON  08/08/2019   Procedure: PLACEMENT OF DRAINING SETON;  Surgeon: Andria Meuse, MD;   Location: [facility];  Service: General;;   RECTAL EXAM UNDER ANESTHESIA N/A 02/20/2020   Procedure: ANORECTAL EXAM UNDER ANESTHESIA;  Surgeon: Andria Meuse, MD;  Location: [facility];  Service: General;  Laterality: N/A;       Home Medications    Prior to Admission medications   Medication Sig Start Date End Date Taking? Authorizing Provider  fluticasone (FLONASE) 50 MCG/ACT nasal spray Place 1 spray into both nostrils daily. 02/01/24  Yes Radford Pax, NP  ofloxacin (FLOXIN) 0.3 % OTIC solution Place 5 drops into the left ear 2 (two) times daily for 7 days. 02/01/24 02/08/24 Yes Radford Pax, NP  predniSONE (DELTASONE) 20 MG tablet Take 2 tablets (40 mg total) by mouth daily with breakfast for 5 days. 02/01/24 02/06/24 Yes Radford Pax, NP  acetaminophen (TYLENOL) 500 MG tablet Take 500 mg by mouth every 6 (six) hours as needed.    [provider]  albuterol (PROVENTIL) (2.5 MG/3ML) 0.083% nebulizer solution Take 3 mLs (2.5 mg total) by nebulization every 4 (four) hours as needed for wheezing or shortness of breath. 09/26/23 09/25/24  Landis Martins, PA-C  albuterol (VENTOLIN HFA) 108 (90 Base) MCG/ACT inhaler Inhale 2 puffs into the lungs every 6 (six) hours as needed for wheezing or shortness of breath. 09/26/23   White, Elizabeth A, PA-C  atorvastatin (LIPITOR) 20 MG tablet Take 1 tablet by mouth once daily 05/26/23  Georganna Skeans, MD  baclofen (LIORESAL) 10 MG tablet Take 1 tablet (10 mg total) by mouth 3 (three) times daily as needed for muscle spasms. 09/26/23   White, Elizabeth A, PA-C  calcium carbonate (TUMS - DOSED IN MG ELEMENTAL CALCIUM) 500 MG chewable tablet Chew 2 tablets by mouth daily. As needed.    [provider]  famotidine (PEPCID) 20 MG tablet Take 1 tablet (20 mg total) by mouth daily. 09/28/23 10/28/23  Ernie Avena, MD  lidocaine (XYLOCAINE) 2 % solution Use as directed 15 mLs in the mouth or throat as needed for mouth pain. 12/27/23   Cathlyn Parsons, NP  NON FORMULARY "Milk decile"    [provider]  Omega-3 Fatty Acids (FISH OIL) 500 MG CAPS Take 1,000 mg by mouth daily.    [provider]  omeprazole (PRILOSEC) 20 MG capsule Take 1 capsule (20 mg total) by mouth daily. 08/17/23   Rema Fendt, NP  ondansetron (ZOFRAN-ODT) 4 MG disintegrating tablet Take 1 tablet (4 mg total) by mouth every 8 (eight) hours as needed for nausea or vomiting. 08/17/23   Rema Fendt, NP  SUMAtriptan (IMITREX) 50 MG tablet Take 50 mg (1 tablet total) by mouth at the start of the headache. May repeat in 2 hours x 1 if headache persists. Max of 2 tablets/24 hours. 08/17/23   Rema Fendt, NP    Family History Family History  Problem Relation Age of Onset   Hypertension Mother    Hypertension Father    Pancreatic cancer Maternal Aunt    Lung cancer Maternal Aunt    Diabetes Maternal Grandmother    Colon cancer Maternal Grandfather    Stroke Maternal Grandfather    Lung cancer Maternal Grandfather    Brain cancer Maternal Grandfather    Stomach cancer Neg Hx    Rectal cancer Neg Hx    Esophageal cancer Neg Hx     Social History Social History   Tobacco Use   Smoking status: Some Days    Current packs/day: 0.25    Average packs/day: 0.3 packs/day for 10.0 years (2.5 ttl pk-yrs)    Types: Cigarettes    Passive exposure: Current   Smokeless tobacco: Never   Tobacco comments:    Smokes on weekends   Vaping Use   Vaping status: Never Used  Substance Use Topics   Alcohol use: Yes    Comment: 2 bottles liquor/ per wk   Drug use: Yes    Frequency: 3.0 times per week    Types: Marijuana    Comment: 09/17/2023     Allergies   Bee venom, Shellfish allergy, Compazine [prochlorperazine], and Other   Review of Systems Review of Systems  HENT:  Positive for ear pain.      Physical Exam Triage Vital Signs ED Triage Vitals  Encounter Vitals Group     BP 02/01/24 1759 (!) 135/90     Systolic BP Percentile --       Diastolic BP Percentile --      Pulse Rate 02/01/24 1759 67     Resp 02/01/24 1759 18     Temp 02/01/24 1759 98.4 F (36.9 C)     Temp Source 02/01/24 1759 Oral     SpO2 02/01/24 1759 95 %     Weight --      Height --      Head Circumference --      Peak Flow --      Pain Score  02/01/24 1757 6     Pain Loc --      Pain Education --      Exclude from Growth Chart --    No data found.  Updated Vital Signs BP (!) 135/90 (BP Location: Right Arm)   Pulse 67   Temp 98.4 F (36.9 C) (Oral)   Resp 18   SpO2 95%   Visual Acuity Right Eye Distance:   Left Eye Distance:   Bilateral Distance:    Right Eye Near:   Left Eye Near:    Bilateral Near:     Physical Exam Vitals and nursing note reviewed.  Constitutional:      General: He is not in acute distress.    Appearance: Normal appearance. He is not ill-appearing.  HENT:     Head: Normocephalic and atraumatic.     Right Ear: Tympanic membrane and ear canal normal.     Left Ear: Swelling present. No drainage. A middle ear effusion is present. Tympanic membrane is not erythematous.  Eyes:     Pupils: Pupils are equal, round, and reactive to light.  Cardiovascular:     Rate and Rhythm: Normal rate.  Pulmonary:     Effort: Pulmonary effort is normal.  Skin:    General: Skin is warm and dry.  Neurological:     General: No focal deficit present.     Mental Status: He is alert and oriented to person, place, and time.  Psychiatric:        Mood and Affect: Mood normal.        Behavior: Behavior normal.      UC Treatments / Results  Labs (all labs ordered are listed, but only abnormal results are displayed) Labs Reviewed - No data to display  EKG   Radiology No results found.  Procedures Procedures (including critical care time)  Medications Ordered in UC Medications - No data to display  Initial Impression / Assessment and Plan / UC Course  I have reviewed the triage vital signs and the nursing  notes.  Pertinent labs & imaging results that were available during my care of the patient were reviewed by me and considered in my medical decision making (see chart for details).     Reviewed exam and symptoms with patient.  No red flags.  Will start Flonase and prednisone for eustachian tube dysfunction.  Start ofloxacin antibiotic eardrops as prescribed.  Patient instructed to keep water out of ear until treatment is complete.  May continue OTC analgesics as needed.  PCP follow-up if symptoms do not improve.  ER precautions reviewed and patient verbalized understanding. Final Clinical Impressions(s) / UC Diagnoses   Final diagnoses:  Eustachian tube dysfunction, left  Acute otitis externa of left ear, unspecified type   Discharge Instructions   None    ED Prescriptions     Medication Sig Dispense Auth. Provider   fluticasone (FLONASE) 50 MCG/ACT nasal spray Place 1 spray into both nostrils daily. 15.8 mL Radford Pax, NP   predniSONE (DELTASONE) 20 MG tablet Take 2 tablets (40 mg total) by mouth daily with breakfast for 5 days. 10 tablet Radford Pax, NP   ofloxacin (FLOXIN) 0.3 % OTIC solution Place 5 drops into the left ear 2 (two) times daily for 7 days. 5 mL Radford Pax, NP      PDMP not reviewed this encounter.   Radford Pax, NP 02/01/24 [phone]

## 2024-02-01 NOTE — Discharge Instructions (Addendum)
 Start Flonase and prednisone daily.  Ofloxacin antibiotic eardrops as prescribed.  Keep water at the ear until treatment is complete.  You may continue over-the-counter Tylenol or ibuprofen as needed for pain.  Please follow-up with your PCP if your symptoms do not improve.  Please go to the ER for any worsening symptoms.  Hope you feel better soon!

## 2024-02-07 ENCOUNTER — Encounter (HOSPITAL_COMMUNITY): Payer: Self-pay

## 2024-02-07 ENCOUNTER — Ambulatory Visit (HOSPITAL_COMMUNITY)
Admission: EM | Admit: 2024-02-07 | Discharge: 2024-02-07 | Disposition: A | Discharge status: Home / Self Care | Attending: Family Medicine | Admitting: Family Medicine

## 2024-02-07 DIAGNOSIS — A084 Viral intestinal infection, unspecified: Secondary | ICD-10-CM | POA: Diagnosis not present

## 2024-02-07 LAB — POC COVID19/FLU A&B COMBO
Covid Antigen, POC: NEGATIVE
Influenza A Antigen, POC: NEGATIVE
Influenza B Antigen, POC: NEGATIVE

## 2024-02-07 MED ORDER — ACETAMINOPHEN 325 MG PO TABS
975.0000 mg | ORAL_TABLET | Freq: Once | ORAL | Status: AC
  Administered 2024-02-07: 975 mg via ORAL

## 2024-02-07 MED ORDER — ONDANSETRON 4 MG PO TBDP
4.0000 mg | ORAL_TABLET | Freq: Once | ORAL | Status: AC
  Administered 2024-02-07: 4 mg via ORAL

## 2024-02-07 MED ORDER — ONDANSETRON 4 MG PO TBDP: 4.0000 mg | ORAL_TABLET | Freq: Three times a day (TID) | ORAL | 0 refills | Status: DC | PRN

## 2024-02-07 MED ORDER — ACETAMINOPHEN 325 MG PO TABS
ORAL_TABLET | ORAL | Status: AC
  Filled 2024-02-07: qty 2

## 2024-02-07 MED ORDER — ONDANSETRON 4 MG PO TBDP
ORAL_TABLET | ORAL | Status: AC
  Filled 2024-02-07: qty 1

## 2024-02-07 MED ORDER — ACETAMINOPHEN 325 MG PO TABS
ORAL_TABLET | ORAL | Status: AC
  Filled 2024-02-07: qty 1

## 2024-02-07 NOTE — ED Provider Notes (Signed)
 [facility]   [id number] 02/07/24 Arrival Time: 1444  ASSESSMENT & PLAN:  1. Viral gastroenteritis    Tolerating PO fluids here.  Meds ordered this encounter  Medications   ondansetron (ZOFRAN-ODT) disintegrating tablet 4 mg   ondansetron (ZOFRAN-ODT) 4 MG disintegrating tablet    Sig: Take 1 tablet (4 mg total) by mouth every 8 (eight) hours as needed for nausea or vomiting.    Dispense:  15 tablet    Refill:  0   acetaminophen (TYLENOL) tablet 975 mg   Benign abdomen. Discussed typical duration of symptoms for suspected viral GI illness. Will do his best to ensure adequate fluid intake in order to avoid dehydration. Will proceed to the Emergency Department for evaluation if unable to tolerate PO fluids regularly.  Otherwise he will f/u with his PCP or here if not showing improvement over the next 48-72 hours.  Reviewed expectations re: course of current medical issues. Questions answered. Outlined signs and symptoms indicating need for more acute intervention. Patient verbalized understanding. After Visit Summary given.   SUBJECTIVE: History from: patient.  Mateusz Neilan is a 33 y.o. male who presents with complaint of non-bilious, non-bloody intermittent n/v with non-bloody diarrhea. Onset {$Time; disease onset (duration):[phone]}. Abdominal discomfort: {Desc; none/mild/moderate/severe:13498} and {quality:618}. Symptoms are {course:17::"unchanged"} since beginning. Aggravating factors: {aggrav factors:619}. Alleviating factors: {allev factors:620}. Associated symptoms: {sx:621}. He denies {sx:621}. Appetite: {gen normal-decreased-absent GN:562130}. PO intake: {desc; normal/decreased:14572}. Ambulatory {With/Without:20273} assistance. Urinary symptoms: {Symptoms; urinary:12437}. Sick contacts: {NONE DEFAULTED:18576}. Recent travel or camping: {NONE DEFAULTED:18576}. OTC treatment: {NONE DEFAULTED:18576}.  Past Surgical History:  Procedure Laterality Date    EVALUATION UNDER ANESTHESIA WITH HEMORRHOIDECTOMY N/A 08/08/2019   Procedure: EXAM UNDER ANESTHESIA WITH IRRIGATION AND DRAINAGE OF PERIRECTAL ABSCESS;  Surgeon: Andria Meuse, MD;  Location: [facility];  Service: General;  Laterality: N/A;   FISTULOTOMY N/A 02/20/2020   Procedure: PARTIAL  FISTULOTOMY WITH PLACEMENT OF CUTTING TETON;  Surgeon: Andria Meuse, MD;  Location: [facility];  Service: General;  Laterality: N/A;   INCISION AND DRAINAGE PERIRECTAL ABSCESS N/A 06/16/2018   Procedure: Francia Greaves UNDER ANESTHESIA IRRIGATION AND DEBRIDEMENT PERIRECTAL ABSCESS;  Surgeon: Emelia Loron, MD;  Location: [facility];  Service: General;  Laterality: N/A;   KNEE ARTHROSCOPY     PLACEMENT OF SETON  08/08/2019   Procedure: PLACEMENT OF DRAINING SETON;  Surgeon: Andria Meuse, MD;  Location: [facility];  Service: General;;   RECTAL EXAM UNDER ANESTHESIA N/A 02/20/2020   Procedure: ANORECTAL EXAM UNDER ANESTHESIA;  Surgeon: Andria Meuse, MD;  Location: [facility];  Service: General;  Laterality: N/A;    ROS: As per HPI.  OBJECTIVE:  Vitals:   02/07/24 1602 02/07/24 1607  BP:  126/83  Pulse:  93  Resp:  16  Temp:  (!) 100.5 F (38.1 C)  TempSrc:  Oral  SpO2:  96%  Weight: 113.4 kg   Height: 5\' 11"  (1.803 m)     General appearance: alert; no distress Oropharynx: moist Lungs: clear to auscultation bilaterally; unlabored Heart: regular rate and rhythm Abdomen: soft; non-distended Extremities: no edema; symmetrical with no gross deformities Skin: warm; dry Neurologic: normal gait Psychological: alert and cooperative; normal mood and affect  Labs: Results for orders placed or performed during the hospital encounter of 02/07/24  POC Covid19/Flu A&B Antigen   Collection Time: 02/07/24  4:42 PM  Result Value Ref Range   Influenza A Antigen, POC Negative Negative   Influenza B Antigen, POC Negative Negative   Covid Antigen, POC Negative  Negative   Labs Reviewed  POC COVID19/FLU A&B  COMBO    Allergies  Allergen Reactions   Bee Venom Anaphylaxis   Shellfish Allergy Anaphylaxis    Pt says not allergic anymore   Compazine [Prochlorperazine] Other (See Comments)    Trembling, feeling hot.    Other Itching    CHG wipes                                               Past Medical History:  Diagnosis Date   Abscess    Anxiety    Arthritis    knee, back   Asthma    Elevated cholesterol    Kidney stones    Social History   Socioeconomic History   Marital status: Single    Spouse name: Not on file   Number of children: Not on file   Years of education: Not on file   Highest education level: Not on file  Occupational History   Occupation: Research scientist (medical) [employer]  Tobacco Use   Smoking status: Some Days    Current packs/day: 0.25    Average packs/day: 0.3 packs/day for 10.0 years (2.5 ttl pk-yrs)    Types: Cigarettes    Passive exposure: Current   Smokeless tobacco: Never   Tobacco comments:    Smokes on weekends   Vaping Use   Vaping status: Never Used  Substance and Sexual Activity   Alcohol use: Yes    Comment: 2 bottles liquor/ per wk   Drug use: Yes    Frequency: 3.0 times per week    Types: Marijuana    Comment: 09/17/2023   Sexual activity: Not on file  Other Topics Concern   Not on file  Social History Narrative   ** Merged History Encounter **       Social Drivers of Health   Financial Resource Strain: Not on file  Food Insecurity: No Food Insecurity (02/11/2023)   Hunger Vital Sign    Worried About Running Out of Food in the Last Year: Never true    Ran Out of Food in the Last Year: Never true  Transportation Needs: Not on file  Physical Activity: Not on file  Stress: Not on file  Social Connections: Not on file  Intimate Partner Violence: Not At Risk (02/11/2023)   Humiliation, Afraid, Rape, and Kick questionnaire    Fear of Current or Ex-Partner: No    Emotionally Abused: No    Physically Abused: No    Sexually Abused: No   Family  History  Problem Relation Age of Onset   Hypertension Mother    Hypertension Father    Pancreatic cancer Maternal Aunt    Lung cancer Maternal Aunt    Diabetes Maternal Grandmother    Colon cancer Maternal Grandfather    Stroke Maternal Grandfather    Lung cancer Maternal Grandfather    Brain cancer Maternal Grandfather    Stomach cancer Neg Hx    Rectal cancer Neg Hx    Esophageal cancer Neg Hx

## 2024-02-07 NOTE — Discharge Instructions (Signed)
Please do your best to ensure adequate fluid intake in order to avoid dehydration. If you find that you are unable to tolerate drinking fluids regularly please proceed to the Emergency Department for evaluation. ° ° °

## 2024-02-07 NOTE — ED Triage Notes (Signed)
 Patient here today with c/o nausea, vomiting, abd pain, diarrhea, fever, and chills that started overnight last night. Patient recently traveled [place]. His godson is having the same symptoms.

## 2024-02-13 ENCOUNTER — Ambulatory Visit (INDEPENDENT_AMBULATORY_CARE_PROVIDER_SITE_OTHER): Payer: BC Managed Care – PPO | Admitting: Family Medicine

## 2024-02-13 ENCOUNTER — Other Ambulatory Visit (HOSPITAL_COMMUNITY)
Admission: RE | Admit: 2024-02-13 | Discharge: 2024-02-13 | Disposition: A | Discharge status: Home / Self Care | Source: Ambulatory Visit | Attending: Family Medicine | Admitting: Family Medicine

## 2024-02-13 VITALS — BP 145/82 | HR 63 | Temp 98.1°F | Resp 18 | Ht 71.0 in | Wt 268.2 lb

## 2024-02-13 DIAGNOSIS — Z113 Encounter for screening for infections with a predominantly sexual mode of transmission: Secondary | ICD-10-CM | POA: Insufficient documentation

## 2024-02-13 DIAGNOSIS — Z13220 Encounter for screening for lipoid disorders: Secondary | ICD-10-CM | POA: Diagnosis not present

## 2024-02-13 DIAGNOSIS — Z0000 Encounter for general adult medical examination without abnormal findings: Secondary | ICD-10-CM | POA: Diagnosis not present

## 2024-02-13 DIAGNOSIS — Z130 Encounter for screening for diseases of the blood and blood-forming organs and certain disorders involving the immune mechanism: Secondary | ICD-10-CM

## 2024-02-14 ENCOUNTER — Encounter: Payer: Self-pay | Admitting: Family Medicine

## 2024-02-14 LAB — CMP14+EGFR
ALT: 31 IU/L (ref 0–44)
AST: 27 IU/L (ref 0–40)
Albumin: 4.6 g/dL (ref 4.1–5.1)
Alkaline Phosphatase: 85 IU/L (ref 44–121)
BUN/Creatinine Ratio: 14 (ref 9–20)
BUN: 15 mg/dL (ref 6–20)
Bilirubin Total: <0.2 mg/dL (ref 0.0–1.2)
CO2: 21 mmol/L (ref 20–29)
Calcium: 9.6 mg/dL (ref 8.7–10.2)
Chloride: 104 mmol/L (ref 96–106)
Creatinine, Ser: 1.07 mg/dL (ref 0.76–1.27)
Globulin, Total: 2.9 g/dL (ref 1.5–4.5)
Glucose: 106 mg/dL — ABNORMAL HIGH (ref 70–99)
Potassium: 4.3 mmol/L (ref 3.5–5.2)
Sodium: 140 mmol/L (ref 134–144)
Total Protein: 7.5 g/dL (ref 6.0–8.5)
eGFR: 95 mL/min/{1.73_m2} (ref >59)

## 2024-02-14 LAB — CBC WITH DIFFERENTIAL/PLATELET
Basophils Absolute: 0 10*3/uL (ref 0.0–0.2)
Basos: 0 %
EOS (ABSOLUTE): 0 10*3/uL (ref 0.0–0.4)
Eos: 1 %
Hematocrit: 38.6 % (ref 37.5–51.0)
Hemoglobin: 12.9 g/dL — ABNORMAL LOW (ref 13.0–17.7)
Immature Grans (Abs): 0 10*3/uL (ref 0.0–0.1)
Immature Granulocytes: 0 %
Lymphocytes Absolute: 2 10*3/uL (ref 0.7–3.1)
Lymphs: 42 %
MCH: 29.5 pg (ref 26.6–33.0)
MCHC: 33.4 g/dL (ref 31.5–35.7)
MCV: 88 fL (ref 79–97)
Monocytes Absolute: 0.4 10*3/uL (ref 0.1–0.9)
Monocytes: 9 %
Neutrophils Absolute: 2.3 10*3/uL (ref 1.4–7.0)
Neutrophils: 48 %
Platelets: 360 10*3/uL (ref 150–450)
RBC: 4.37 x10E6/uL (ref 4.14–5.80)
RDW: 13.3 % (ref 11.6–15.4)
WBC: 4.8 10*3/uL (ref 3.4–10.8)

## 2024-02-14 LAB — URINE CYTOLOGY ANCILLARY ONLY
Chlamydia: NEGATIVE
Comment: NEGATIVE
Comment: NEGATIVE
Comment: NORMAL
Neisseria Gonorrhea: NEGATIVE
Trichomonas: NEGATIVE

## 2024-02-14 LAB — LIPID PANEL
Chol/HDL Ratio: 6.3 ratio — ABNORMAL HIGH (ref 0.0–5.0)
Cholesterol, Total: 184 mg/dL (ref 100–199)
HDL: 29 mg/dL — ABNORMAL LOW (ref >39)
LDL Chol Calc (NIH): 92 mg/dL (ref 0–99)
Triglycerides: 382 mg/dL — ABNORMAL HIGH (ref 0–149)
VLDL Cholesterol Cal: 63 mg/dL — ABNORMAL HIGH (ref 5–40)

## 2024-02-14 LAB — HIV ANTIBODY (ROUTINE TESTING W REFLEX): HIV Screen 4th Generation wRfx: NONREACTIVE

## 2024-02-14 NOTE — Progress Notes (Signed)
 Established Patient Office Visit  Subjective    Patient ID: Victor Burns, male    DOB: 21-Jan-1991  Age: 33 y.o. MRN: [id number]  CC:  Chief Complaint  Patient presents with   Annual Exam    HPI Victor Burns presents for routine annual exam. Patient denies acute complaints.   Outpatient Encounter Medications as of 02/13/2024  Medication Sig   acetaminophen (TYLENOL) 500 MG tablet Take 500 mg by mouth every 6 (six) hours as needed.   albuterol (PROVENTIL) (2.5 MG/3ML) 0.083% nebulizer solution Take 3 mLs (2.5 mg total) by nebulization every 4 (four) hours as needed for wheezing or shortness of breath.   albuterol (VENTOLIN HFA) 108 (90 Base) MCG/ACT inhaler Inhale 2 puffs into the lungs every 6 (six) hours as needed for wheezing or shortness of breath.   calcium carbonate (TUMS - DOSED IN MG ELEMENTAL CALCIUM) 500 MG chewable tablet Chew 2 tablets by mouth daily. As needed.   fluticasone (FLONASE) 50 MCG/ACT nasal spray Place 1 spray into both nostrils daily.   NON FORMULARY "Milk decile"   omeprazole (PRILOSEC) 20 MG capsule Take 1 capsule (20 mg total) by mouth daily.   ondansetron (ZOFRAN-ODT) 4 MG disintegrating tablet Take 1 tablet (4 mg total) by mouth every 8 (eight) hours as needed for nausea or vomiting.   No facility-administered encounter medications on file as of 02/13/2024.    Past Medical History:  Diagnosis Date   Abscess    Anxiety    Arthritis    knee, back   Asthma    Elevated cholesterol    Kidney stones     Past Surgical History:  Procedure Laterality Date   EVALUATION UNDER ANESTHESIA WITH HEMORRHOIDECTOMY N/A 08/08/2019   Procedure: EXAM UNDER ANESTHESIA WITH IRRIGATION AND DRAINAGE OF PERIRECTAL ABSCESS;  Surgeon: Andria Meuse, MD;  Location: [facility];  Service: General;  Laterality: N/A;   FISTULOTOMY N/A 02/20/2020   Procedure: PARTIAL  FISTULOTOMY WITH PLACEMENT OF CUTTING TETON;  Surgeon: Andria Meuse, MD;  Location: [facility];  Service:  General;  Laterality: N/A;   INCISION AND DRAINAGE PERIRECTAL ABSCESS N/A 06/16/2018   Procedure: Francia Greaves UNDER ANESTHESIA IRRIGATION AND DEBRIDEMENT PERIRECTAL ABSCESS;  Surgeon: Emelia Loron, MD;  Location: [facility];  Service: General;  Laterality: N/A;   KNEE ARTHROSCOPY     PLACEMENT OF SETON  08/08/2019   Procedure: PLACEMENT OF DRAINING SETON;  Surgeon: Andria Meuse, MD;  [facility]: [facility];  Service: General;;   RECTAL EXAM UNDER ANESTHESIA N/A 02/20/2020   Procedure: ANORECTAL EXAM UNDER ANESTHESIA;  Surgeon: Andria Meuse, MD;  Location: [facility];  Service: General;  Laterality: N/A;    Family History  Problem Relation Age of Onset   Hypertension Mother    Hypertension Father    Pancreatic cancer Maternal Aunt    Lung cancer Maternal Aunt    Diabetes Maternal Grandmother    Colon cancer Maternal Grandfather    Stroke Maternal Grandfather    Lung cancer Maternal Grandfather    Brain cancer Maternal Grandfather    Stomach cancer Neg Hx    Rectal cancer Neg Hx    Esophageal cancer Neg Hx     Social History   Socioeconomic History   Marital status: Single    Spouse name: Not on file   Number of children: Not on file   Years of education: Not on file   Highest education level: 12th grade  Occupational History   Occupation: Systems analyst  Tobacco  Use   Smoking status: Some Days    Current packs/day: 0.25    Average packs/day: 0.3 packs/day for 10.0 years (2.5 ttl pk-yrs)    Types: Cigarettes    Passive exposure: Current   Smokeless tobacco: Never   Tobacco comments:    Smokes on weekends   Vaping Use   Vaping status: Never Used  Substance and Sexual Activity   Alcohol use: Yes    Comment: 2 bottles liquor/ per wk   Drug use: Yes    Frequency: 3.0 times per week    Types: Marijuana    Comment: 09/17/2023   Sexual activity: Not on file  Other Topics Concern   Not on file  Social History Narrative   ** Merged History Encounter **       Social  Drivers of Health   Financial Resource Strain: Low Risk  (02/13/2024)   Overall Financial Resource Strain (CARDIA)    Difficulty of Paying Living Expenses: Not hard at all  Food Insecurity: No Food Insecurity (02/13/2024)   Hunger Vital Sign    Worried About Running Out of Food in the Last Year: Never true    Ran Out of Food in the Last Year: Never true  Transportation Needs: No Transportation Needs (02/13/2024)   PRAPARE - Administrator, Civil Service (Medical): No    Lack of Transportation (Non-Medical): No  Physical Activity: Unknown (02/13/2024)   Exercise Vital Sign    Days of Exercise per Week: 0 days    Minutes of Exercise per Session: Not on file  Stress: No Stress Concern Present (02/13/2024)   [employer] of Occupational Health - Occupational Stress Questionnaire    Feeling of Stress : Not at all  Social Connections: Moderately Integrated (02/13/2024)   Social Connection and Isolation Panel [NHANES]    Frequency of Communication with Friends and Family: More than three times a week    Frequency of Social Gatherings with Friends and Family: More than three times a week    Attends Religious Services: 1 to 4 times per year    Active Member of [employer] or Organizations: No    Attends Banker Meetings: Not on file    Marital Status: Living with partner  Intimate Partner Violence: Not At Risk (02/11/2023)   Humiliation, Afraid, Rape, and Kick questionnaire    Fear of Current or Ex-Partner: No    Emotionally Abused: No    Physically Abused: No    Sexually Abused: No    Review of Systems  All other systems reviewed and are negative.       Objective    BP (!) 145/82   Pulse 63   Temp 98.1 F (36.7 C) (Oral)   Resp 18   Ht 5\' 11"  (1.803 m)   Wt 268 lb 3.2 oz (121.7 kg)   SpO2 95%   BMI 37.41 kg/m   Physical Exam Vitals and nursing note reviewed.  Constitutional:      General: He is not in acute distress. HENT:     Head: Normocephalic  and atraumatic.     Right Ear: Tympanic membrane, ear canal and external ear normal.     Left Ear: Tympanic membrane, ear canal and external ear normal.     Nose: Nose normal.     Mouth/Throat:     Mouth: Mucous membranes are moist.     Pharynx: Oropharynx is clear.  Eyes:     Conjunctiva/sclera: Conjunctivae normal.     Pupils:  Pupils are equal, round, and reactive to light.  Neck:     Thyroid: No thyromegaly.  Cardiovascular:     Rate and Rhythm: Normal rate and regular rhythm.     Heart sounds: Normal heart sounds. No murmur heard. Pulmonary:     Effort: Pulmonary effort is normal.     Breath sounds: Normal breath sounds.  Abdominal:     General: There is no distension.     Palpations: Abdomen is soft. There is no mass.     Tenderness: There is no abdominal tenderness.     Hernia: There is no hernia in the left inguinal area or right inguinal area.  Genitourinary:    Penis: Normal.      Testes: Normal.  Musculoskeletal:        General: Normal range of motion.     Cervical back: Normal range of motion and neck supple.     Right lower leg: No edema.     Left lower leg: No edema.  Skin:    General: Skin is warm and dry.  Neurological:     General: No focal deficit present.     Mental Status: He is alert and oriented to person, place, and time. Mental status is at baseline.  Psychiatric:        Mood and Affect: Mood normal.        Behavior: Behavior normal.         Assessment & Plan:   Annual physical exam -     CMP14+EGFR  Screening for deficiency anemia -     CBC with Differential/Platelet  Screening for lipid disorders -     Lipid panel  Screening for STDs (sexually transmitted diseases) -     Urine cytology ancillary only -     HIV Antibody (routine testing w rflx)     Return in about 1 year (around 02/12/2025).   Tommie Raymond, MD

## 2024-05-14 ENCOUNTER — Encounter (HOSPITAL_COMMUNITY): Payer: Self-pay

## 2024-05-14 ENCOUNTER — Emergency Department (HOSPITAL_COMMUNITY)
Admission: EM | Admit: 2024-05-14 | Discharge: 2024-05-14 | Disposition: A | Discharge status: Home / Self Care | Attending: Emergency Medicine | Admitting: Emergency Medicine

## 2024-05-14 ENCOUNTER — Emergency Department (HOSPITAL_COMMUNITY)

## 2024-05-14 ENCOUNTER — Other Ambulatory Visit: Payer: Self-pay

## 2024-05-14 DIAGNOSIS — R079 Chest pain, unspecified: Secondary | ICD-10-CM | POA: Insufficient documentation

## 2024-05-14 DIAGNOSIS — R0789 Other chest pain: Secondary | ICD-10-CM | POA: Diagnosis not present

## 2024-05-14 DIAGNOSIS — I10 Essential (primary) hypertension: Secondary | ICD-10-CM

## 2024-05-14 DIAGNOSIS — R030 Elevated blood-pressure reading, without diagnosis of hypertension: Secondary | ICD-10-CM | POA: Insufficient documentation

## 2024-05-14 DIAGNOSIS — R918 Other nonspecific abnormal finding of lung field: Secondary | ICD-10-CM | POA: Diagnosis not present

## 2024-05-14 DIAGNOSIS — R202 Paresthesia of skin: Secondary | ICD-10-CM | POA: Insufficient documentation

## 2024-05-14 LAB — CBC
HCT: 38.8 % — ABNORMAL LOW (ref 39.0–52.0)
Hemoglobin: 12.7 g/dL — ABNORMAL LOW (ref 13.0–17.0)
MCH: 29.3 pg (ref 26.0–34.0)
MCHC: 32.7 g/dL (ref 30.0–36.0)
MCV: 89.6 fL (ref 80.0–100.0)
Platelets: 319 10*3/uL (ref 150–400)
RBC: 4.33 MIL/uL (ref 4.22–5.81)
RDW: 13.3 % (ref 11.5–15.5)
WBC: 4.6 10*3/uL (ref 4.0–10.5)
nRBC: 0 % (ref 0.0–0.2)

## 2024-05-14 LAB — BASIC METABOLIC PANEL WITH GFR
Anion gap: 12 (ref 5–15)
BUN: 16 mg/dL (ref 6–20)
CO2: 21 mmol/L — ABNORMAL LOW (ref 22–32)
Calcium: 9 mg/dL (ref 8.9–10.3)
Chloride: 105 mmol/L (ref 98–111)
Creatinine, Ser: 1.07 mg/dL (ref 0.61–1.24)
GFR, Estimated: >60 mL/min (ref >60)
Glucose, Bld: 96 mg/dL (ref 70–99)
Potassium: 3.9 mmol/L (ref 3.5–5.1)
Sodium: 138 mmol/L (ref 135–145)

## 2024-05-14 LAB — TROPONIN I (HIGH SENSITIVITY)
Troponin I (High Sensitivity): 8 ng/L (ref <18)
Troponin I (High Sensitivity): 5 ng/L (ref <18)

## 2024-05-14 LAB — D-DIMER, QUANTITATIVE: D-Dimer, Quant: 2.73 ug{FEU}/mL — ABNORMAL HIGH (ref 0.00–0.50)

## 2024-05-14 MED ORDER — SODIUM CHLORIDE (PF) 0.9 % IJ SOLN
INTRAMUSCULAR | Status: AC
  Filled 2024-05-14: qty 50

## 2024-05-14 MED ORDER — IOHEXOL 350 MG/ML SOLN
75.0000 mL | Freq: Once | INTRAVENOUS | Status: AC | PRN
  Administered 2024-05-14: 100 mL via INTRAVENOUS

## 2024-05-14 NOTE — ED Triage Notes (Signed)
 Patient states he started having high blood pressure and feeling like he was going to pass out. He also reports chest pain on the left side and tingling in his left arm. Symptoms began at 2300. Patient felt normal just before that. No history of high blood pressure. BP was 174/121 at home. Hx of asthma.

## 2024-05-14 NOTE — ED Provider Notes (Signed)
 Patient was initially seen by Dr. Monique Ano.  Please see her note. Patient CT angiogram was pending at the time of shift change.  Patient's cardiac workup was otherwise reassuring.  Normal troponins.  CT scan does not show any acute abnormality.  Incidental nodule noted.  No follow up needed for low risk patients.     Trish Furl, MD 05/14/24 [phone]

## 2024-05-14 NOTE — ED Provider Notes (Addendum)
 [place] EMERGENCY DEPARTMENT AT [facility] Provider Note   CSN: [id number] Arrival date & time: 05/14/24  4098     History  Chief Complaint  Patient presents with   Hypertension    Jasai Sorg is a 33 y.o. male.  The history is provided by the patient.  Hypertension  Milas Schappell is a 33 y.o. male who presents to the Emergency Department complaining of chest pain.  He presents to the emergency department for evaluation of chest pain that started around 330 this morning.  Pain is located in the left axillary region and was sharp in nature.  It lasted for 15 to 20 minutes and then resolved.  He states that around 11 PM he started feeling different and started checking his blood pressure 3-4 times per hour.  He states he felt unwell and like he was going to pass out.  He felt like he could not get into a comfortable position.  His blood pressures when he checked it was 170s over 120s.  He also had tingling described as pins-and-needles in his left palm at midnight.  No fever, cough, difficulty breathing, abdominal pain, headache.  He previously has been on a statin but is not currently on a statin.  He does not take any medication for high blood pressure.  He has a family history of hypertension in his parents. No history of DVT/PE. He does smoke tobacco.  He drinks alcohol on the weekends.  He did drink a small amount of alcohol today.  He does occasionally use cocaine by snorting, last use was Saturday.  Denies any anxiety or recent stressors.      Home Medications Prior to Admission medications   Medication Sig Start Date End Date Taking? Authorizing Provider  acetaminophen  (TYLENOL ) 500 MG tablet Take 500 mg by mouth every 6 (six) hours as needed.    [provider]  albuterol  (PROVENTIL ) (2.5 MG/3ML) 0.083% nebulizer solution Take 3 mLs (2.5 mg total) by nebulization every 4 (four) hours as needed for wheezing or shortness of breath. 09/26/23 09/25/24   Kreg Pesa, PA-C  albuterol  (VENTOLIN  HFA) 108 (90 Base) MCG/ACT inhaler Inhale 2 puffs into the lungs every 6 (six) hours as needed for wheezing or shortness of breath. 09/26/23   White, Anh Mangano A, PA-C  calcium  carbonate (TUMS - DOSED IN MG ELEMENTAL CALCIUM ) 500 MG chewable tablet Chew 2 tablets by mouth daily. As needed.    [provider]  fluticasone  (FLONASE ) 50 MCG/ACT nasal spray Place 1 spray into both nostrils daily. 02/01/24   Alleen Arbour, NP  NON FORMULARY "Milk decile"    [provider]  omeprazole  (PRILOSEC) 20 MG capsule Take 1 capsule (20 mg total) by mouth daily. 08/17/23   Senaida Dama, NP  ondansetron  (ZOFRAN -ODT) 4 MG disintegrating tablet Take 1 tablet (4 mg total) by mouth every 8 (eight) hours as needed for nausea or vomiting. 02/07/24   Afton Albright, MD      Allergies    Bee venom, Shellfish allergy, Compazine  [prochlorperazine ], and Other    Review of Systems   Review of Systems  All other systems reviewed and are negative.   Physical Exam Updated Vital Signs BP (!) 159/103   Pulse 61   Temp 98.4 F (36.9 C) (Oral)   Resp 13   Ht 5\' 11"  (1.803 m)   Wt 122.5 kg   SpO2 98%   BMI 37.66 kg/m  Physical Exam Vitals and nursing note reviewed.  Constitutional:      Appearance: He is well-developed.  HENT:     Head: Normocephalic and atraumatic.  Cardiovascular:     Rate and Rhythm: Normal rate and regular rhythm.     Heart sounds: No murmur heard. Pulmonary:     Effort: Pulmonary effort is normal. No respiratory distress.     Breath sounds: Normal breath sounds.  Abdominal:     Palpations: Abdomen is soft.     Tenderness: There is no abdominal tenderness. There is no guarding or rebound.  Musculoskeletal:        General: No swelling or tenderness.     Comments: 2+ DP pulses bilaterally  Skin:    General: Skin is warm and dry.     Capillary Refill: Capillary refill takes less than 2 seconds.  Neurological:     Mental  Status: He is alert and oriented to person, place, and time.     Comments: 5/5 strength in all four extremities with sensation to light touch intact in all four extremities.   Psychiatric:        Behavior: Behavior normal.     ED Results / Procedures / Treatments   Labs (all labs ordered are listed, but only abnormal results are displayed) Labs Reviewed  BASIC METABOLIC PANEL WITH GFR - Abnormal; Notable for the following components:      Result Value   CO2 21 (*)    All other components within normal limits  CBC - Abnormal; Notable for the following components:   Hemoglobin 12.7 (*)    HCT 38.8 (*)    All other components within normal limits  D-DIMER, QUANTITATIVE - Abnormal; Notable for the following components:   D-Dimer, Quant 2.73 (*)    All other components within normal limits  TROPONIN I (HIGH SENSITIVITY)  TROPONIN I (HIGH SENSITIVITY)    EKG EKG Interpretation Date/Time:  Monday May 14 2024 04:43:28 EDT Ventricular Rate:  60 PR Interval:  179 QRS Duration:  95 QT Interval:  409 QTC Calculation: 409 R Axis:   52  Text Interpretation: Sinus rhythm ST elev, probable normal early repol pattern similar when compared to prior Confirmed by Kelsey Patricia [phone]) on 05/14/2024 4:46:20 AM  Radiology CT Angio Chest/Abd/Pel for Dissection W and/or W/WO Result Date: 05/14/2024 CLINICAL DATA:  Hypertension.  Acute aortic syndrome. EXAM: CT ANGIOGRAPHY CHEST, ABDOMEN AND PELVIS TECHNIQUE: Non-contrast CT of the chest was initially obtained. Multidetector CT imaging through the chest, abdomen and pelvis was performed using the standard protocol during bolus administration of intravenous contrast. Multiplanar reconstructed images and MIPs were obtained and reviewed to evaluate the vascular anatomy. RADIATION DOSE REDUCTION: This exam was performed according to the departmental dose-optimization program which includes automated exposure control, adjustment of the mA and/or kV  according to patient size and/or use of iterative reconstruction technique. CONTRAST:  OMNIPAQUE  IOHEXOL  350 MG/ML SOLN COMPARISON:  Abdomen and pelvis CT 11/20/2019 FINDINGS: CTA CHEST FINDINGS Cardiovascular: Pre contrast imaging of the chest shows no hyperdense crescent in the wall of the thoracic aorta to suggest the presence of an acute intramural hematoma. No thoracic aortic aneurysm. No dissection of the thoracic aorta. Aortic arch arterial branch vessel anatomy is widely patent. The heart size is normal. No substantial pericardial effusion. No large central pulmonary embolus. Mediastinum/Nodes: No mediastinal lymphadenopathy. There is no hilar lymphadenopathy. The esophagus has normal imaging features. There is no axillary lymphadenopathy. Lungs/Pleura: 3 mm left lower lobe nodule identified on 89/8. No overtly suspicious pulmonary nodule  or mass. No focal airspace consolidation. There is no evidence of pleural effusion. Musculoskeletal: No worrisome lytic or sclerotic osseous abnormality. Review of the MIP images confirms the above findings. CTA ABDOMEN AND PELVIS FINDINGS VASCULAR Aorta: Normal caliber aorta without aneurysm, dissection, vasculitis or significant stenosis. Celiac: Patent without evidence of aneurysm, dissection, vasculitis or significant stenosis. SMA: Patent without evidence of aneurysm, dissection, vasculitis or significant stenosis. Renals: Both renal arteries are patent without evidence of aneurysm, dissection, vasculitis, fibromuscular dysplasia or significant stenosis. IMA: Patent without evidence of aneurysm, dissection, vasculitis or significant stenosis. Inflow: Patent without evidence of aneurysm, dissection, vasculitis or significant stenosis. Veins: No obvious venous abnormality within the limitations of this arterial phase study. Review of the MIP images confirms the above findings. NON-VASCULAR Hepatobiliary: No suspicious focal abnormality within the liver parenchyma.  There is no evidence for gallstones, gallbladder wall thickening, or pericholecystic fluid. No intrahepatic or extrahepatic biliary dilation. Pancreas: No focal mass lesion. No dilatation of the main duct. No intraparenchymal cyst. No peripancreatic edema. Spleen: No splenomegaly. No suspicious focal mass lesion. Adrenals/Urinary Tract: No adrenal nodule or mass. Kidneys unremarkable. No evidence for hydroureter. The urinary bladder appears normal for the degree of distention. Stomach/Bowel: Stomach is unremarkable. No gastric wall thickening. No evidence of outlet obstruction. Duodenum is normally positioned as is the ligament of Treitz. No small bowel wall thickening. No small bowel dilatation. The terminal ileum is normal. The appendix is normal. No gross colonic mass. No colonic wall thickening. Lymphatic: There is no gastrohepatic or hepatoduodenal ligament lymphadenopathy. No retroperitoneal or mesenteric lymphadenopathy. No pelvic sidewall lymphadenopathy. Reproductive: The prostate gland and seminal vesicles are unremarkable. Other: No intraperitoneal free fluid. Musculoskeletal: No worrisome lytic or sclerotic osseous abnormality. Review of the MIP images confirms the above findings. IMPRESSION: 1. No evidence for thoracoabdominal aortic aneurysm. No dissection of the thoracoabdominal aorta. No acute findings in the chest, abdomen, or pelvis. No acute findings on the current study. 2. 3 mm left lower lobe pulmonary nodule. No follow-up needed if patient is low-risk.This recommendation follows the consensus statement: Guidelines for Management of Incidental Pulmonary Nodules Detected on CT Images: From the Fleischner Society 2017; Radiology 2017; 284:228-243. Electronically Signed   By: Donnal Fusi M.D.   On: 05/14/2024 07:16   DG Chest Port 1 View Result Date: 05/14/2024 CLINICAL DATA:  Chest pain. EXAM: PORTABLE CHEST 1 VIEW COMPARISON:  09/27/2023 FINDINGS: The lungs are clear without focal pneumonia,  edema, pneumothorax or pleural effusion. The cardiopericardial silhouette is within normal limits for size. No acute bony abnormality. Telemetry leads overlie the chest. IMPRESSION: No active disease. Electronically Signed   By: Donnal Fusi M.D.   On: 05/14/2024 05:31    Procedures Procedures    Medications Ordered in ED Medications  iohexol  (OMNIPAQUE ) 350 MG/ML injection 75 mL (100 mLs Intravenous Contrast Given 05/14/24 6962)    ED Course/ Medical Decision Making/ A&P                                 Medical Decision Making Amount and/or Complexity of Data Reviewed Labs: ordered. Radiology: ordered.  Risk Prescription drug management.   Patient here for evaluation of left-sided chest pain, tingling in his left arm, elevated blood pressure and sensation that something was not right since 11 PM.  EKG is similar when compared to priors.  D-dimer was obtained, which was mildly elevated.  CTA was obtained.  Initial troponin is  negative.  Blood pressure is mildly elevated but not consistent with hypertensive urgency.  Patient care transferred pending CTA, repeat troponin. In terms of tingling in hand - no focal deficits, no current sxs.  Current picture is not c/w CVA.         Final Clinical Impression(s) / ED Diagnoses Final diagnoses:  None    Rx / DC Orders ED Discharge Orders     None         Kelsey Patricia, MD 05/14/24 1610    Kelsey Patricia, MD 05/14/24 [id number]

## 2024-05-14 NOTE — Discharge Instructions (Addendum)
 The test today in the ED did not show any signs of serious heart or lung problems.  Follow-up with your primary care doctor to recheck on your blood pressure.  There was an incidental small nodule noted in the lung.  For low risk non-smoker patient has no follow-up is necessary.

## 2024-05-14 NOTE — ED Notes (Signed)
 2300 cp underneath left arm pit sharp 0-10 scale = 6 not like this before. Took blood pressure and noted it was elevated at home. Not able to lay down due to pain. Denies NV congestion. Feeling like he's going to pass out.

## 2024-09-22 ENCOUNTER — Encounter: Payer: Self-pay | Admitting: *Deleted

## 2024-09-22 ENCOUNTER — Other Ambulatory Visit: Payer: Self-pay

## 2024-09-22 ENCOUNTER — Ambulatory Visit
Admission: EM | Admit: 2024-09-22 | Discharge: 2024-09-22 | Disposition: A | Discharge status: Home / Self Care | Attending: Emergency Medicine | Admitting: Emergency Medicine

## 2024-09-22 ENCOUNTER — Ambulatory Visit

## 2024-09-22 DIAGNOSIS — K2900 Acute gastritis without bleeding: Secondary | ICD-10-CM

## 2024-09-22 DIAGNOSIS — M25532 Pain in left wrist: Secondary | ICD-10-CM | POA: Diagnosis not present

## 2024-09-22 MED ORDER — PANTOPRAZOLE SODIUM 40 MG PO TBEC: 40.0000 mg | DELAYED_RELEASE_TABLET | Freq: Every day | ORAL | 0 refills | Status: AC

## 2024-09-22 MED ORDER — ONDANSETRON 4 MG PO TBDP: 4.0000 mg | ORAL_TABLET | Freq: Four times a day (QID) | ORAL | 0 refills | Status: AC | PRN
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
## 2024-09-22 NOTE — Discharge Instructions (Addendum)
 Rest - try to avoid heavy lifting and high impact activity Ice - apply for 20 minutes a few times daily Compression - use brace for support Elevation - prop up on a pillow Please call orthopedics to make a follow up appointment    Protonix  (pantoprazole ) once every morning for the next 14 days. It can take 1-3 days to start working. Take first thing in the morning on an empty stomach.  Remain upright for 30 minutes after taking medicine.  Please take the full 14-day course.  Bland diet for the next 2 weeks.  Avoid fatty, greasy, citrusy, spicy, caffeine  and alcohol as these can worsen symptoms.  Do not use any ibuprofen /Advil  or other NSAIDs as these can worsen symptoms.  If at any point your symptoms worsen, including severe pain or inability to tolerate fluids, please go directly to the emergency department.

## 2024-10-11 ENCOUNTER — Other Ambulatory Visit: Payer: Self-pay | Admitting: Family Medicine

## 2024-10-11 NOTE — Telephone Encounter (Unsigned)
 Copied from CRM [id number]. Topic: Clinical - Medication Refill >> Oct 11, 2024  8:42 AM Donna BRAVO wrote: Medication: albuterol  (VENTOLIN  HFA) 108 (90 Base) MCG/ACT inhaler  Has the patient contacted their pharmacy? No  Patient is in [place] going on a cruise leaving at noon today forgot inhaler,    This is the patient's preferred pharmacy:  [facility] [address] [place], [place] [address] Phone [phone]   Is this the correct pharmacy for this prescription? Yes If no, delete pharmacy and type the correct one.   Has the prescription been filled recently? No  Is the patient out of the medication? Yes ----uses inhaler as needed  Has the patient been seen for an appointment in the last year OR does the patient have an upcoming appointment? Yes  Can we respond through MyChart? No  Agent: Please be advised that Rx refills may take up to 3 business days. We ask that you follow-up with your pharmacy.

## 2024-10-12 MED ORDER — ALBUTEROL SULFATE HFA 108 (90 BASE) MCG/ACT IN AERS: 2.0000 | INHALATION_SPRAY | Freq: Four times a day (QID) | RESPIRATORY_TRACT | 2 refills | Status: DC | PRN

## 2024-10-12 NOTE — Telephone Encounter (Signed)
 Pt scheduled on pcp next available appt

## 2024-10-12 NOTE — Telephone Encounter (Signed)
 Requested medication (s) are due for refill today - expired Rx  Requested medication (s) are on the active medication list -yes  Future visit scheduled -no  Last refill: 09/26/23 75ml 2RF  Notes to clinic: outside provider- sent for review   Requested Prescriptions  Pending Prescriptions Disp Refills   albuterol  (VENTOLIN  HFA) 108 (90 Base) MCG/ACT inhaler 8 g 2    Sig: Inhale 2 puffs into the lungs every 6 (six) hours as needed for wheezing or shortness of breath.     Pulmonology:  Beta Agonists 2 Passed - 10/12/2024  1:42 PM      Passed - Last BP in normal range    BP Readings from Last 1 Encounters:  09/22/24 138/81         Passed - Last Heart Rate in normal range    Pulse Readings from Last 1 Encounters:  09/22/24 60         Passed - Valid encounter within last 12 months    Recent Outpatient Visits           8 months ago Annual physical exam   [place] [facility], [place], MD   1 year ago Migraine without status migrainosus, not intractable, unspecified migraine type   [facility], Amy J, NP   1 year ago Left flank pain   [place] [facility], MD   1 year ago Anterior chest wall pain   [place] [facility], Raguel, MD   1 year ago Annual physical exam   [place] [facility], MD                 Requested Prescriptions  Pending Prescriptions Disp Refills   albuterol  (VENTOLIN  HFA) 108 (90 Base) MCG/ACT inhaler 8 g 2    Sig: Inhale 2 puffs into the lungs every 6 (six) hours as needed for wheezing or shortness of breath.     Pulmonology:  Beta Agonists 2 Passed - 10/12/2024  1:42 PM      Passed - Last BP in normal range    BP Readings from Last 1 Encounters:  09/22/24 138/81         Passed - Last Heart Rate in normal range    Pulse Readings from Last 1 Encounters:  09/22/24 60          Passed - Valid encounter within last 12 months    Recent Outpatient Visits           8 months ago Annual physical exam   [place] [facility], Rag[place], MD   1 year ago Migraine without status migrainosus, not intractable, unspecified migraine type   [facility], [place], NP   1 year ago Left flank pain   [place] [facility], MD   1 year ago Anterior chest wall pain    [facility], MD   1 year ago Annual physical exam    [facility], MD

## 2024-11-19 ENCOUNTER — Other Ambulatory Visit (HOSPITAL_COMMUNITY)
Admission: RE | Admit: 2024-11-19 | Discharge: 2024-11-19 | Disposition: A | Source: Ambulatory Visit | Attending: Family Medicine | Admitting: Family Medicine

## 2024-11-19 ENCOUNTER — Encounter: Payer: Self-pay | Admitting: Family Medicine

## 2024-11-19 ENCOUNTER — Ambulatory Visit: Payer: Self-pay | Admitting: Family Medicine

## 2024-11-19 VITALS — BP 146/91 | HR 51 | Ht 71.0 in | Wt 247.4 lb

## 2024-11-19 DIAGNOSIS — J4520 Mild intermittent asthma, uncomplicated: Secondary | ICD-10-CM

## 2024-11-19 DIAGNOSIS — Z113 Encounter for screening for infections with a predominantly sexual mode of transmission: Secondary | ICD-10-CM | POA: Insufficient documentation

## 2024-11-19 DIAGNOSIS — F17210 Nicotine dependence, cigarettes, uncomplicated: Secondary | ICD-10-CM | POA: Diagnosis not present

## 2024-11-19 MED ORDER — ALBUTEROL SULFATE HFA 108 (90 BASE) MCG/ACT IN AERS: 2.0000 | INHALATION_SPRAY | Freq: Four times a day (QID) | RESPIRATORY_TRACT | 2 refills | Status: AC | PRN

## 2024-11-20 ENCOUNTER — Encounter: Payer: Self-pay | Admitting: Family Medicine

## 2024-11-20 ENCOUNTER — Ambulatory Visit: Payer: Self-pay | Admitting: Family Medicine

## 2024-11-20 LAB — HEPB+HEPC+HIV PANEL
HIV Screen 4th Generation wRfx: NONREACTIVE
Hep B C IgM: NEGATIVE
Hep B Core Total Ab: NEGATIVE
Hep B E Ab: NONREACTIVE
Hep B E Ag: NEGATIVE
Hep B Surface Ab, Qual: REACTIVE
Hep C Virus Ab: NONREACTIVE
Hepatitis B Surface Ag: NEGATIVE

## 2024-11-20 LAB — GC/CHLAMYDIA PROBE AMP (~~LOC~~) NOT AT ARMC
Chlamydia: NEGATIVE
Comment: NEGATIVE
Comment: NORMAL
Neisseria Gonorrhea: NEGATIVE

## 2024-11-20 NOTE — Progress Notes (Signed)
 Established Patient Office Visit  Subjective    Patient ID: Victor Burns, male    DOB: [birth year]  Age: 33 y.o. MRN: [id number]  CC:  Chief Complaint  Patient presents with   std testing     HPI Victor Burns presents wanting  STI testing. He reports a recent partner that was HIV undetectable. Patient also would like to be referred for PREP. Patient denies GU sx.   Outpatient Encounter Medications as of 11/19/2024  Medication Sig   acetaminophen  (TYLENOL ) 500 MG tablet Take 500 mg by mouth every 6 (six) hours as needed.   albuterol  (PROVENTIL ) (2.5 MG/3ML) 0.083% nebulizer solution Take 3 mLs (2.5 mg total) by nebulization every 4 (four) hours as needed for wheezing or shortness of breath.   ondansetron  (ZOFRAN -ODT) 4 MG disintegrating tablet Take 1 tablet (4 mg total) by mouth every 6 (six) hours as needed for nausea or vomiting.   albuterol  (VENTOLIN  HFA) 108 (90 Base) MCG/ACT inhaler Inhale 2 puffs into the lungs every 6 (six) hours as needed for wheezing or shortness of breath.   calcium  carbonate (TUMS - DOSED IN MG ELEMENTAL CALCIUM ) 500 MG chewable tablet Chew 2 tablets by mouth daily. As needed. (Patient not taking: Reported on 11/19/2024)   fluticasone  (FLONASE ) 50 MCG/ACT nasal spray Place 1 spray into both nostrils daily. (Patient not taking: Reported on 11/19/2024)   NON FORMULARY Milk decile (Patient not taking: Reported on 11/19/2024)   pantoprazole  (PROTONIX ) 40 MG tablet Take 1 tablet (40 mg total) by mouth daily for 14 days. (Patient not taking: Reported on 11/19/2024)   [DISCONTINUED] albuterol  (VENTOLIN  HFA) 108 (90 Base) MCG/ACT inhaler Inhale 2 puffs into the lungs every 6 (six) hours as needed for wheezing or shortness of breath.   No facility-administered encounter medications on file as of 11/19/2024.    Past Medical History:  Diagnosis Date   Abscess    Anxiety    Arthritis    knee, back   Asthma    Elevated cholesterol    Kidney stones     Past  Surgical History:  Procedure Laterality Date   EVALUATION UNDER ANESTHESIA WITH HEMORRHOIDECTOMY N/A 08/08/2019   Procedure: EXAM UNDER ANESTHESIA WITH IRRIGATION AND DRAINAGE OF PERIRECTAL ABSCESS;  Surgeon: Teresa Lonni HERO, MD;  Location: [facility];  Service: General;  Laterality: N/A;   FISTULOTOMY N/A 02/20/2020   Procedure: PARTIAL  FISTULOTOMY WITH PLACEMENT OF CUTTING TETON;  Surgeon: Teresa Lonni HERO, MD;  Location: [facility];  Service: General;  Laterality: N/A;   INCISION AND DRAINAGE PERIRECTAL ABSCESS N/A 06/16/2018   Procedure: ERASMO UNDER ANESTHESIA IRRIGATION AND DEBRIDEMENT PERIRECTAL ABSCESS;  Surgeon: Ebbie Cough, MD;  Location: [facility];  Service: General;  Laterality: N/A;   KNEE ARTHROSCOPY     PLACEMENT OF SETON  08/08/2019   Procedure: PLACEMENT OF DRAINING SETON;  Surgeon: Teresa Lonni HERO, MD;  Location: [facility];  Service: General;;   RECTAL EXAM UNDER ANESTHESIA N/A 02/20/2020   Procedure: ANORECTAL EXAM UNDER ANESTHESIA;  Surgeon: Teresa Lonni HERO, MD;  Location: [facility];  Service: General;  Laterality: N/A;    Family History  Problem Relation Age of Onset   Hypertension Mother    Hypertension Father    Pancreatic cancer Maternal Aunt    Lung cancer Maternal Aunt    Diabetes Maternal Grandmother    Colon cancer Maternal Grandfather    Stroke Maternal Grandfather    Lung cancer Maternal Grandfather    Brain cancer Maternal Grandfather  Stomach cancer Neg Hx    Rectal cancer Neg Hx    Esophageal cancer Neg Hx     Social History   Socioeconomic History   Marital status: Single    Spouse name: Not on file   Number of children: Not on file   Years of education: Not on file   Highest education level: 12th grade  Occupational History   Occupation: research scientist (medical) [employer]  Tobacco Use   Smoking status: Some Days    Current packs/day: 0.25    Average packs/day: 0.3 packs/day for 10.0 years (2.5 ttl pk-yrs)    Types: Cigarettes    Passive exposure:  Current   Smokeless tobacco: Never   Tobacco comments:    Smokes on weekends   Vaping Use   Vaping status: Never Used  Substance and Sexual Activity   Alcohol use: Yes    Comment: 1/2 gallon liquor on weekends, pint during the week   Drug use: Yes    Frequency: 3.0 times per week    Types: Marijuana    Comment: occasional   Sexual activity: Not on file  Other Topics Concern   Not on file  Social History Narrative   ** Merged History Encounter **       Social Drivers of Health   Tobacco Use: High Risk (11/20/2024)   Patient History    Smoking Tobacco Use: Some Days    Smokeless Tobacco Use: Never    Passive Exposure: Current  Financial Resource Strain: Low Risk (02/13/2024)   Overall Financial Resource Strain (CARDIA)    Difficulty of Paying Living Expenses: Not hard at all  Food Insecurity: No Food Insecurity (02/13/2024)   Hunger Vital Sign    Worried About Running Out of Food in the Last Year: Never true    Ran Out of Food in the Last Year: Never true  Transportation Needs: No Transportation Needs (02/13/2024)   PRAPARE - Administrator, Civil Service (Medical): No    Lack of Transportation (Non-Medical): No  Physical Activity: Unknown (02/13/2024)   Exercise Vital Sign    Days of Exercise per Week: 0 days    Minutes of Exercise per Session: Not on file  Stress: No Stress Concern Present (02/13/2024)   [employer] of Occupational Health - Occupational Stress Questionnaire    Feeling of Stress : Not at all  Social Connections: Moderately Integrated (02/13/2024)   Social Connection and Isolation Panel    Frequency of Communication with Friends and Family: More than three times a week    Frequency of Social Gatherings with Friends and Family: More than three times a week    Attends Religious Services: 1 to 4 times per year    Active Member of [employer] or Organizations: No    Attends Banker Meetings: Not on file    Marital Status: Living  with partner  Intimate Partner Violence: Not At Risk (02/11/2023)   Humiliation, Afraid, Rape, and Kick questionnaire    Fear of Current or Ex-Partner: No    Emotionally Abused: No    Physically Abused: No    Sexually Abused: No  Depression (PHQ2-9): Low Risk (02/13/2024)   Depression (PHQ2-9)    PHQ-2 Score: 0  Alcohol Screen: Medium Risk (02/13/2024)   Alcohol Screen    Last Alcohol Screening Score (AUDIT): 10  Housing: High Risk (02/13/2024)   Housing Stability Vital Sign    Unable to Pay for Housing in the Last Year: Yes    Number of  Times Moved in the Last Year: 0    Homeless in the Last Year: No  Utilities: Not At Risk (02/13/2024)   AHC Utilities    Threatened with loss of utilities: No  Health Literacy: Adequate Health Literacy (02/13/2024)   B1300 Health Literacy    Frequency of need for help with medical instructions: Never    Review of Systems  All other systems reviewed and are negative.       Objective    BP (!) 146/91   Pulse (!) 51   Ht 5' 11 (1.803 m)   Wt 247 lb 6.4 oz (112.2 kg)   SpO2 97%   BMI 34.51 kg/m   Physical Exam Vitals and nursing note reviewed.  Constitutional:      General: He is not in acute distress. Cardiovascular:     Rate and Rhythm: Normal rate and regular rhythm.  Pulmonary:     Effort: Pulmonary effort is normal.     Breath sounds: Normal breath sounds.  Neurological:     General: No focal deficit present.     Mental Status: He is alert and oriented to person, place, and time.         Assessment & Plan:   Screening for STDs (sexually transmitted diseases) -     HepB+HepC+HIV Panel -     GC/Chlamydia probe amp ([place])not at [facility] -     Ambulatory referral to Infectious Disease  Mild intermittent asthma without complication  Smokes cigarettes  Other orders -     Albuterol  Sulfate HFA; Inhale 2 puffs into the lungs every 6 (six) hours as needed for wheezing or shortness of breath.  Dispense: 8 g; Refill: 2      No follow-ups on file.   Tanda Raguel SQUIBB, MD

## 2024-11-30 ENCOUNTER — Other Ambulatory Visit (HOSPITAL_COMMUNITY): Payer: Self-pay

## 2024-11-30 NOTE — Progress Notes (Deleted)
 NEW REFERRAL TO CPP CLINIC    HPI: Victor Burns is a 33 y.o. male who presents to the [facility] pharmacy clinic to discuss and initiate PrEP.  Referring ID Provider: ***  Patient Active Problem List   Diagnosis Date Noted   Prediabetes 06/03/2022   Hyperlipidemia 06/03/2022   GERD (gastroesophageal reflux disease) 11/24/2021   Perirectal abscess s/p I&D 06/16/2018 06/16/2018   Alcohol abuse 06/16/2018   Mild persistent asthma 10/07/2017    Patient's Medications  New Prescriptions   No medications on file  Previous Medications   ACETAMINOPHEN  (TYLENOL ) 500 MG TABLET    Take 500 mg by mouth every 6 (six) hours as needed.   ALBUTEROL  (PROVENTIL ) (2.5 MG/3ML) 0.083% NEBULIZER SOLUTION    Take 3 mLs (2.5 mg total) by nebulization every 4 (four) hours as needed for wheezing or shortness of breath.   ALBUTEROL  (VENTOLIN  HFA) 108 (90 BASE) MCG/ACT INHALER    Inhale 2 puffs into the lungs every 6 (six) hours as needed for wheezing or shortness of breath.   CALCIUM  CARBONATE (TUMS - DOSED IN MG ELEMENTAL CALCIUM ) 500 MG CHEWABLE TABLET    Chew 2 tablets by mouth daily. As needed.   FLUTICASONE  (FLONASE ) 50 MCG/ACT NASAL SPRAY    Place 1 spray into both nostrils daily.   NON FORMULARY    Milk decile   ONDANSETRON  (ZOFRAN -ODT) 4 MG DISINTEGRATING TABLET    Take 1 tablet (4 mg total) by mouth every 6 (six) hours as needed for nausea or vomiting.   PANTOPRAZOLE  (PROTONIX ) 40 MG TABLET    Take 1 tablet (40 mg total) by mouth daily for 14 days.  Modified Medications   No medications on file  Discontinued Medications   No medications on file        No data to display          Labs:  SCr: Lab Results  Component Value Date   CREATININE 1.07 05/14/2024   CREATININE 1.07 02/13/2024   CREATININE 1.04 09/27/2023   CREATININE 0.85 08/17/2023   CREATININE 1.01 05/25/2023   HIV Lab Results  Component Value Date   HIV Non Reactive 11/19/2024   HIV Non Reactive 02/13/2024   HIV Non  Reactive 02/11/2023   HIV Non Reactive 06/02/2022   HIV Non Reactive 10/07/2021   Hepatitis B Lab Results  Component Value Date   HEPBSAG Negative 11/19/2024   HEPBCAB Negative 11/19/2024   Hepatitis C No results found for: HEPCAB, HCVRNAPCRQN Hepatitis A No results found for: HAV RPR and STI Lab Results  Component Value Date   LABRPR Non Reactive 06/02/2022   LABRPR Non Reactive 10/07/2021   LABRPR Non Reactive (A) 08/06/2019   LABRPR NON REAC 04/03/2016    STI Results GC GC CT CT  11/19/2024  3:26 PM Negative   Negative    02/13/2024  4:24 PM Negative   Negative    04/01/2023  9:07 AM Negative   Negative    02/11/2023 11:07 AM Negative   Negative    06/02/2022  9:07 AM Negative   Negative    10/07/2021  3:32 PM Negative   Negative    04/03/2016  4:05 PM  NOT DETECTED   NOT DETECTED     Assessment: Victor Burns is here today to discuss and initiate medication for HIV prevention. He was referred here by his PCP and was last seen by her on 11/19/24 where he requested STD testing due to having a new partner that was HIV positive (undetectable). Labs  at his 12/15 appointment showed a non-reactive Hepatitis B sAg, cAb, and Hep C ab. He does have documented immunity to Hepatitis B (reactive sAb). HIV and urine screen for gonorrhea and chlamydia were all negative at that appointment as well.   He is insured through [employer]. Discussed all possible PrEP medications - Descovy, Apretude, and Yeztugo. He prefers ***.   Eligible vaccinations:  [facility] showed that he has received two HPV vaccines. His last tdap was in 2008.    Number of partners in last 3 months: *** Partner preference: *** Sexual practices: ***  Use of prevention/protection: ***  STD history: *** History of IVDU? *** History of PEP? ***  Plan: ***  Victor Burns L. Yesenia Locurto, PharmD, BCIDP, AAHIVP, CPP Clinical Pharmacist Practitioner - Infectious Diseases Clinical Pharmacist Lead - Specialty Pharmacy  [facility]

## 2024-12-03 ENCOUNTER — Other Ambulatory Visit (HOSPITAL_COMMUNITY): Payer: Self-pay

## 2024-12-03 ENCOUNTER — Telehealth: Payer: Self-pay

## 2024-12-03 ENCOUNTER — Ambulatory Visit: Admitting: Pharmacist

## 2024-12-03 DIAGNOSIS — Z113 Encounter for screening for infections with a predominantly sexual mode of transmission: Secondary | ICD-10-CM

## 2024-12-03 DIAGNOSIS — Z114 Encounter for screening for human immunodeficiency virus [HIV]: Secondary | ICD-10-CM

## 2024-12-03 NOTE — Telephone Encounter (Signed)
 [facility] Patient Advocate Encounter  Insurance verification completed.    The patient is insured through [employer] [place] .     Ran test claim for DESCOVY   The current 30 day co-pay is $50.  Ran test claim for APRETUDE   This medication may be covered under medical benefits .  Ran test claim for YEZTUGO TABS & INJ   This medication may be covered under medical benefits .  We will continue to follow to see if copay assistance is needed.  This test claim was processed through [facility]- copay amounts may vary at other pharmacies due to pharmacy/plan contracts, or as the patient moves through the different stages of their insurance plan.

## 2024-12-08 ENCOUNTER — Telehealth: Admitting: Nurse Practitioner

## 2024-12-08 DIAGNOSIS — J069 Acute upper respiratory infection, unspecified: Secondary | ICD-10-CM | POA: Diagnosis not present

## 2024-12-08 MED ORDER — PSEUDOEPH-BROMPHEN-DM 30-2-10 MG/5ML PO SYRP: 5.0000 mL | ORAL_SOLUTION | Freq: Four times a day (QID) | ORAL | 0 refills | Status: AC | PRN

## 2024-12-08 MED ORDER — NAPROXEN 500 MG PO TABS: 500.0000 mg | ORAL_TABLET | Freq: Two times a day (BID) | ORAL | 0 refills | Status: AC

## 2024-12-08 NOTE — Progress Notes (Signed)
 E visit for Flu like symptoms   We are sorry that you are not feeling well.  Here is how we plan to help! Based on what you have shared with me it looks like you may have flu-like symptoms that should be watched but do not seem to indicate anti-viral treatment.  Influenza or the flu is  an infection caused by a respiratory virus. The flu virus is highly contagious and persons who did not receive their yearly flu vaccination may catch the flu from close contact.  We have anti-viral medications to treat the viruses that cause this infection. They are not a cure and only shorten the course of the infection. These prescriptions are most effective when they are given within the first 2 days of flu symptoms. Antiviral medications are indicated if you have a high risk of complications from the flu. You should  also consider an antiviral medication if you are in close contact with someone who is at risk. These medications can help patients avoid complications from the flu but have side effects that you should know.   Possible side effects from Tamiflu or oseltamivir include nausea, vomiting, diarrhea, dizziness, headaches, eye redness, sleep problems or other respiratory symptoms. You should not take Tamiflu if you have an allergy to oseltamivir or any to the ingredients in Tamiflu..   For nasal congestion, you may use an oral decongestant such as Mucinex D or if you have glaucoma or high blood pressure use plain Mucinex.  Saline nasal spray or nasal drops can help and can safely be used as often as needed for congestion.  If you have a sore or scratchy throat, use a saltwater gargle-  to  teaspoon of salt dissolved in a 4-ounce to 8-ounce glass of warm water.  Gargle the solution for approximately 15-30 seconds and then spit.  It is important not to swallow the solution.  You can also use throat lozenges/cough drops and Chloraseptic spray to help with throat pain or discomfort.  Warm or cold liquids  can also be helpful in relieving throat pain.  For headache, pain or general discomfort, you can use Ibuprofen  or Tylenol  as directed.   Some authorities believe that zinc sprays or the use of Echinacea may shorten the course of your symptoms.  I have prescribed the following medications to help lessen symptoms: I have prescribed an anti-inflammatory - Naprosyn  500 mg. Take twice daily as needed for fever or body aches for 2 weeks as well as a cough syrup for cough.  You are to isolate at home until you have been fever-free for at least 24 hours without a fever-reducing medication, and symptoms have been steadily improving for 24 hours.  If you must be around other household members who do not have symptoms, you need to make sure that both you and the family members are masking consistently with a high-quality mask.  If you note any worsening of symptoms despite treatment, please seek an in-person evaluation ASAP. If you note any significant shortness of breath or any chest pain, please seek ED evaluation. Please do not delay care!  ANYONE WHO HAS FLU SYMPTOMS SHOULD: Stay home. The flu is highly contagious and going out or to work exposes others! Be sure to drink plenty of fluids. Water is fine as well as fruit juices, sodas and electrolyte beverages. You may want to stay away from caffeine  or alcohol. If you are nauseated, try taking small sips of liquids. How do you know if you are  getting enough fluid? Your urine should be a pale yellow or almost colorless. Get rest. Taking a steamy shower or using a humidifier may help nasal congestion and ease sore throat pain. Using a saline nasal spray works much the same way. Cough drops, hard candies and sore throat lozenges may ease your cough. Line up a caregiver. Have someone check on you regularly.  GET HELP RIGHT AWAY IF: You cannot keep down liquids or your medications. You become short of breath Your fell like you are going to pass out or  loose consciousness. Your symptoms persist after you have completed your treatment plan  MAKE SURE YOU  Understand these instructions. Will watch your condition. Will get help right away if you are not doing well or get worse.  Your e-visit answers were reviewed by a board certified advanced clinical practitioner to complete your personal care plan.  Depending on the condition, your plan could have included both over the counter or prescription medications.  If there is a problem please reply  once you have received a response from your provider.  Your safety is important to us .  If you have drug allergies check your prescription carefully.    You can use MyChart to ask questions about todays visit, request a non-urgent call back, or ask for a work or school excuse for 24 hours related to this e-Visit. If it has been greater than 24 hours you will need to follow up with your provider, or enter a new e-Visit to address those concerns.  You will get an e-mail in the next two days asking about your experience.  I hope that your e-visit has been valuable and will speed your recovery. Thank you for using e-visits.   I have spent 5 minutes in review of e-visit questionnaire, review and updating patient chart, medical decision making and response to patient.   Bexton Haak W Ahlana Slaydon, NP

## 2024-12-11 ENCOUNTER — Ambulatory Visit: Admitting: Family Medicine

## 2025-01-11 ENCOUNTER — Other Ambulatory Visit (HOSPITAL_COMMUNITY): Payer: Self-pay

## 2025-01-15 ENCOUNTER — Ambulatory Visit: Payer: Self-pay | Admitting: Pharmacist
